# Patient Record
Sex: Male | Born: 1960 | Race: White | Hispanic: No | Marital: Married | Smoking: Never smoker
Health system: Southern US, Community
[De-identification: ages and names within clinical notes are randomized; demographics above are authoritative.]

## PROBLEM LIST (undated history)

## (undated) DIAGNOSIS — Z953 Presence of xenogenic heart valve: Secondary | ICD-10-CM

## (undated) DIAGNOSIS — I10 Essential (primary) hypertension: Secondary | ICD-10-CM

## (undated) DIAGNOSIS — E785 Hyperlipidemia, unspecified: Secondary | ICD-10-CM

## (undated) DIAGNOSIS — Z9889 Other specified postprocedural states: Secondary | ICD-10-CM

## (undated) DIAGNOSIS — I351 Nonrheumatic aortic (valve) insufficiency: Secondary | ICD-10-CM

## (undated) DIAGNOSIS — R011 Cardiac murmur, unspecified: Secondary | ICD-10-CM

## (undated) DIAGNOSIS — Z803 Family history of malignant neoplasm of breast: Secondary | ICD-10-CM

## (undated) DIAGNOSIS — G510 Bell's palsy: Secondary | ICD-10-CM

## (undated) DIAGNOSIS — I340 Nonrheumatic mitral (valve) insufficiency: Secondary | ICD-10-CM

## (undated) DIAGNOSIS — R972 Elevated prostate specific antigen [PSA]: Secondary | ICD-10-CM

## (undated) DIAGNOSIS — I341 Nonrheumatic mitral (valve) prolapse: Secondary | ICD-10-CM

## (undated) HISTORY — DX: Hyperlipidemia, unspecified: E78.5

## (undated) HISTORY — DX: Elevated prostate specific antigen (PSA): R97.20

## (undated) HISTORY — PX: INGUINAL HERNIA REPAIR: SUR1180

## (undated) HISTORY — PX: HIP FRACTURE SURGERY: SHX118

## (undated) HISTORY — DX: Family history of malignant neoplasm of breast: Z80.3

## (undated) HISTORY — DX: Cardiac murmur, unspecified: R01.1

## (undated) HISTORY — DX: Bell's palsy: G51.0

## (undated) HISTORY — DX: Essential (primary) hypertension: I10

## (undated) HISTORY — PX: MITRAL VALVE REPLACEMENT: SHX147

## (undated) HISTORY — DX: Presence of xenogenic heart valve: Z95.3

## (undated) HISTORY — DX: Nonrheumatic mitral (valve) prolapse: I34.1

## (undated) HISTORY — DX: Other specified postprocedural states: Z98.890

## (undated) HISTORY — DX: Nonrheumatic aortic (valve) insufficiency: I35.1

## (undated) HISTORY — PX: AORTIC VALVE REPLACEMENT: SHX41

## (undated) HISTORY — DX: Nonrheumatic mitral (valve) insufficiency: I34.0

---

## 2005-07-13 ENCOUNTER — Ambulatory Visit (HOSPITAL_COMMUNITY): Admission: RE | Admit: 2005-07-13 | Discharge: 2005-07-13 | Payer: Self-pay | Admitting: *Deleted

## 2010-07-22 ENCOUNTER — Other Ambulatory Visit: Payer: Self-pay

## 2010-07-27 ENCOUNTER — Other Ambulatory Visit: Payer: Self-pay | Admitting: Internal Medicine

## 2010-07-27 ENCOUNTER — Encounter (INDEPENDENT_AMBULATORY_CARE_PROVIDER_SITE_OTHER): Payer: Self-pay | Admitting: *Deleted

## 2010-07-27 ENCOUNTER — Other Ambulatory Visit: Payer: PRIVATE HEALTH INSURANCE

## 2010-07-27 DIAGNOSIS — Z0389 Encounter for observation for other suspected diseases and conditions ruled out: Secondary | ICD-10-CM

## 2010-07-27 DIAGNOSIS — Z0000 Encounter for general adult medical examination without abnormal findings: Secondary | ICD-10-CM

## 2010-07-27 LAB — BASIC METABOLIC PANEL
BUN: 21 mg/dL (ref 6–23)
CO2: 27 mEq/L (ref 19–32)
Calcium: 9.3 mg/dL (ref 8.4–10.5)
Chloride: 105 mEq/L (ref 96–112)
Creatinine, Ser: 1.1 mg/dL (ref 0.4–1.5)
GFR: 76.85 mL/min (ref >60.00)
Glucose, Bld: 90 mg/dL (ref 70–99)
Potassium: 4.4 mEq/L (ref 3.5–5.1)
Sodium: 139 mEq/L (ref 135–145)

## 2010-07-27 LAB — CBC WITH DIFFERENTIAL/PLATELET
Basophils Absolute: 0 10*3/uL (ref 0.0–0.1)
Basophils Relative: 0.5 % (ref 0.0–3.0)
Eosinophils Absolute: 0.2 10*3/uL (ref 0.0–0.7)
Eosinophils Relative: 2.6 % (ref 0.0–5.0)
HCT: 45 % (ref 39.0–52.0)
Hemoglobin: 15.2 g/dL (ref 13.0–17.0)
Lymphocytes Relative: 23.6 % (ref 12.0–46.0)
Lymphs Abs: 1.7 10*3/uL (ref 0.7–4.0)
MCHC: 33.9 g/dL (ref 30.0–36.0)
MCV: 91.3 fl (ref 78.0–100.0)
Monocytes Absolute: 0.7 10*3/uL (ref 0.1–1.0)
Monocytes Relative: 9.9 % (ref 3.0–12.0)
Neutro Abs: 4.6 10*3/uL (ref 1.4–7.7)
Neutrophils Relative %: 63.4 % (ref 43.0–77.0)
Platelets: 197 10*3/uL (ref 150.0–400.0)
RBC: 4.93 Mil/uL (ref 4.22–5.81)
RDW: 12.9 % (ref 11.5–14.6)
WBC: 7.3 10*3/uL (ref 4.5–10.5)

## 2010-07-27 LAB — TSH: TSH: 1.49 u[IU]/mL (ref 0.35–5.50)

## 2010-07-27 LAB — URINALYSIS
Bilirubin Urine: NEGATIVE
Hgb urine dipstick: NEGATIVE
Ketones, ur: NEGATIVE
Leukocytes, UA: NEGATIVE
Nitrite: NEGATIVE
Specific Gravity, Urine: 1.025 (ref 1.000–1.030)
Total Protein, Urine: NEGATIVE
Urine Glucose: NEGATIVE
Urobilinogen, UA: 0.2 (ref 0.0–1.0)
pH: 6 (ref 5.0–8.0)

## 2010-07-27 LAB — HEPATIC FUNCTION PANEL
ALT: 43 U/L (ref 0–53)
AST: 23 U/L (ref 0–37)
Albumin: 4.3 g/dL (ref 3.5–5.2)
Alkaline Phosphatase: 55 U/L (ref 39–117)
Bilirubin, Direct: 0.2 mg/dL (ref 0.0–0.3)
Total Bilirubin: 1.1 mg/dL (ref 0.3–1.2)
Total Protein: 6.7 g/dL (ref 6.0–8.3)

## 2010-07-27 LAB — LIPID PANEL
Cholesterol: 180 mg/dL (ref 0–200)
HDL: 41.6 mg/dL (ref >39.00)
LDL Cholesterol: 119 mg/dL — ABNORMAL HIGH (ref 0–99)
Total CHOL/HDL Ratio: 4
Triglycerides: 95 mg/dL (ref 0.0–149.0)
VLDL: 19 mg/dL (ref 0.0–40.0)

## 2010-07-27 LAB — PSA: PSA: 0.84 ng/mL (ref 0.10–4.00)

## 2010-07-29 ENCOUNTER — Encounter: Payer: Self-pay | Admitting: Internal Medicine

## 2010-07-29 ENCOUNTER — Ambulatory Visit (INDEPENDENT_AMBULATORY_CARE_PROVIDER_SITE_OTHER): Payer: PRIVATE HEALTH INSURANCE | Admitting: Internal Medicine

## 2010-07-29 DIAGNOSIS — J45909 Unspecified asthma, uncomplicated: Secondary | ICD-10-CM | POA: Insufficient documentation

## 2010-07-29 DIAGNOSIS — J309 Allergic rhinitis, unspecified: Secondary | ICD-10-CM | POA: Insufficient documentation

## 2010-07-29 DIAGNOSIS — Z0000 Encounter for general adult medical examination without abnormal findings: Secondary | ICD-10-CM

## 2010-07-29 DIAGNOSIS — I059 Rheumatic mitral valve disease, unspecified: Secondary | ICD-10-CM | POA: Insufficient documentation

## 2010-08-09 NOTE — Assessment & Plan Note (Signed)
Summary: NEW cpx/[employer]/ # /CD   Vital Signs:  Patient profile:   50 year old male Height:      72 inches Weight:      171 pounds BMI:     23.28 O2 Sat:      97 % on Room air Temp:     97.6 degrees F oral Pulse rate:   60 / minute BP sitting:   112 / 80  (left arm) Cuff size:   regular  Vitals Entered By: Bill Salinas CMA (July 29, 2010 10:12 AM)  O2 Flow:  Room air CC: New pt here to est care with primary MD  Vision Screening:      Vision Comments: Pt has appt for eye exam in 2 weeks   Primary Care Provider:  Illene Regulus  CC:  New pt here to est care with primary MD.  History of Present Illness: Cody Barnes presents to establish for on-going continuity care.  CC: he reports he has had 3 episodes of dizziness that have all been short in duration. Not room spinning but disorienting and more akin to dysequilibrium. Happened once when driving, twice while sitting at work. He describes being off balance, no nausea, had a slight headache, no chills, no pain in the ear, no double vision. No sequelae after the sensation passed.   He is otherwise feeling great.   Preventive Screening-Counseling & Management  Alcohol-Tobacco     Alcohol drinks/day: <1     Alcohol type: wine, beer     Smoking Status: never  Caffeine-Diet-Exercise     Caffeine use/day: 4 cups daily     Diet Comments: normal healthy diet     Does Patient Exercise: no  Hep-HIV-STD-Contraception     Hepatitis Risk: no risk noted     HIV Risk: no risk noted     STD Risk: no risk noted     Dental Visit-last 6 months yes     Sun Exposure-Excessive: no  Safety-Violence-Falls     Seat Belt Use: yes     Helmet Use: yes     Firearms in the Home: no firearms in the home     Smoke Detectors: yes     Violence in the Home: no risk noted     Sexual Abuse: no      Sexual History:  currently monogamous.        Drug Use:  never.        Blood Transfusions:  no.    Current Medications (verified): 1)   None  Allergies (verified): No Known Drug Allergies  Past History:  Past Medical History: UCD, fully immunized MITRAL VALVE PROLAPSE (ICD-424.0) ALLERGIC RHINITIS (ICD-477.9) ASTHMA, CHILDHOOD (ICD-493.00)  Past Surgical History: Left Hip fracture ORIF '97- fall related.  Family History: Mother - deceased @62 : breast cancer @ 12; recurrence w/ liver, DM, HTN Father- 56: good health; lipids; CAD with mild MI Neg - Prostate or colon cancer Maternal 2 degree kin - frequent cance PGM - CAD/MI @ 30 Paternal 2nd degree kin with CAD/MI  Social History: [facility] [place] - accting Married - '86 1 dtr - '93, 1 son '91 work - Optician, dispensing - accountant SO - multiple medical problems. Smoking Status:  never Caffeine use/day:  4 cups daily Does Patient Exercise:  no Dental Care w/in 6 mos.:  yes Sun Exposure-Excessive:  no Seat Belt Use:  yes Hepatitis Risk:  no risk noted HIV Risk:  no risk noted STD Risk:  no risk  noted Sexual History:  currently monogamous Drug Use:  never Blood Transfusions:  no  Review of Systems  The patient denies anorexia, fever, weight loss, weight gain, vision loss, decreased hearing, hoarseness, chest pain, dyspnea on exertion, prolonged cough, headaches, abdominal pain, severe indigestion/heartburn, hematuria, incontinence, muscle weakness, suspicious skin lesions, difficulty walking, depression, abnormal bleeding, enlarged lymph nodes, angioedema, and testicular masses.         Having reconstructive dental work - front bridge  Physical Exam  General:  Well-developed,well-nourished,in no acute distress; alert,appropriate and cooperative throughout examination Head:  Normocephalic and atraumatic without obvious abnormalities. No apparent alopecia or balding. Eyes:  No corneal or conjunctival inflammation noted. EOMI. Perrla. Funduscopic exam benign, without hemorrhages, exudates or papilledema. Vision grossly normal. Ears:  External ear exam  shows no significant lesions or deformities.  Otoscopic examination reveals clear canals, tympanic membranes are intact bilaterally without bulging, retraction, inflammation or discharge. Hearing is grossly normal bilaterally. Nose:  no external deformity, no external erythema, and no nasal discharge.   Mouth:  Oral mucosa and oropharynx without lesions or exudates.  Teeth in good repair. Neck:  supple, full ROM, no thyromegaly, and no carotid bruits.   Chest Wall:  no deformities.   Lungs:  Normal respiratory effort, chest expands symmetrically. Lungs are clear to auscultation, no crackles or wheezes. Heart:  normal rate and regular rhythm. III/VI systolic murmur heard best at the apex and axillary line. I/VI diastolic murmur. II/VI murmur at RSB.  Abdomen:  soft, non-tender, normal bowel sounds, no guarding, and no hepatomegaly.   Prostate:  deferred to normal PSA Msk:  normal ROM, no joint tenderness, no joint swelling, no joint warmth, and no joint deformities.   Pulses:  2+ Radial, DP and PT pulses Extremities:  No clubbing, cyanosis, edema, or deformity noted with normal full range of motion of all joints.   Neurologic:  alert & oriented X3, cranial nerves II-XII intact, strength normal in all extremities, gait normal, and DTRs symmetrical and normal.   Skin:  turgor normal, color normal, and no suspicious lesions.   Cervical Nodes:  no anterior cervical adenopathy and no posterior cervical adenopathy.   Axillary Nodes:  no R axillary adenopathy and no L axillary adenopathy.   Inguinal Nodes:  no R inguinal adenopathy and no L inguinal adenopathy.   Psych:  Oriented X3, memory intact for recent and remote, normally interactive, and good eye contact.     Impression & Recommendations:  Problem # 1:  MITRAL VALVE PROLAPSE (ICD-424.0) Patient with mitral valve prolapse on exam and question of mild regurg. He reports he has been having q6 months 2D echos. Copies of reports requested. He is  advised that unless he has 2+ or greater regurg annual or every other year echos should suffice.  Problem # 2:  Preventive Health Care (ICD-V70.0) Unremarkable medical history and no active complaints. Physical exam is normal except for mitral valve murmur. Lab results are normal incluidng an LDL of 119 which is less than goal  (NCEP ATPIII) of 130 or less. PSA is normal at 0.84. He is a candidate for routine colorectal cancer screening and is referred to GI for colonoscopy.  No EKG today due to close follow-up by [facility] cardiology. Immunizations: current with inlfuenza; he will check records re: tetnus.   In summary - a nice gentleman who appears to be medically stable. He is advised to adopt a regular aerobic exericse program with a minimum of of aerobic exercise 3 times a  week. He is advised to have repeat general labs no sooner than 3 years unless there is a dramaitc change in life-style. He should return for a general exam in 2+ years otherwise he will return as needed.   Other Orders: Gastroenterology Referral (GI)  Patient: Cody Barnes Note: All result statuses are Final unless otherwise noted.  Tests: (1) CBC Platelet w/Diff (CBCD)   White Cell Count          7.3 K/uL                    4.5-10.5   Red Cell Count            4.93 Mil/uL                 4.22-5.81   Hemoglobin                15.2 g/dL                   16.1-09.6   Hematocrit                45.0 %                      39.0-52.0   MCV                       91.3 fl                     78.0-100.0   MCHC                      33.9 g/dL                   04.5-40.9   RDW                       12.9 %                      11.5-14.6   Platelet Count            197.0 K/uL                  150.0-400.0   Neutrophil %              63.4 %                      43.0-77.0   Lymphocyte %              23.6 %                      12.0-46.0   Monocyte %                9.9 %                       3.0-12.0   Eosinophils%              2.6  %                       0.0-5.0   Basophils %               0.5 %  0.0-3.0   Neutrophill Absolute      4.6 K/uL                    1.4-7.7   Lymphocyte Absolute       1.7 K/uL                    0.7-4.0   Monocyte Absolute         0.7 K/uL                    0.1-1.0  Eosinophils, Absolute                             0.2 K/uL                    0.0-0.7   Basophils Absolute        0.0 K/uL                    0.0-0.1  Tests: (2) Lipid Panel (LIPID)   Cholesterol               180 mg/dL                   0-865     ATP III Classification            Desirable:  < 200 mg/dL                    Borderline High:  200 - 239 mg/dL               High:  > = 240 mg/dL   Triglycerides             95.0 mg/dL                  7.8-469.6     Normal:  <150 mg/dL     Borderline High:  295 - 199 mg/dL   HDL                       28.41 mg/dL                 >32.44   VLDL Cholesterol          19.0 mg/dL                  0.1-02.7   LDL Cholesterol      [H]  253 mg/dL                   6-64  CHO/HDL Ratio:  CHD Risk                             4                    Men          Women     1/2 Average Risk     3.4          3.3     Average Risk          5.0          4.4     2X Average Risk          9.6          7.1  3X Average Risk          15.0          11.0                           Tests: (3) BMP (METABOL)   Sodium                    139 mEq/L                   135-145   Potassium                 4.4 mEq/L                   3.5-5.1   Chloride                  105 mEq/L                   96-112   Carbon Dioxide            27 mEq/L                    19-32   Glucose                   90 mg/dL                    16-10   BUN                       21 mg/dL                    9-60   Creatinine                1.1 mg/dL                   4.5-4.0   Calcium                   9.3 mg/dL                   9.8-11.9   GFR                       76.85 mL/min                >60.00  Tests: (4)  Hepatic/Liver Function Panel (HEPATIC)   Total Bilirubin           1.1 mg/dL                   1.4-7.8   Direct Bilirubin          0.2 mg/dL                   2.9-5.6   Alkaline Phosphatase      55 U/L                      39-117   AST                       23 U/L                      0-37   ALT  43 U/L                      0-53   Total Protein             6.7 g/dL                    0.4-5.4   Albumin                   4.3 g/dL                    0.9-8.1  Tests: (5) TSH (TSH)   FastTSH                   1.49 uIU/mL                 0.35-5.50  Tests: (6) UDip Only (UDIP)   Color                     Yellow       RANGE:  Yellow;Lt. Yellow   Clarity                   CLEAR                       Clear   Specific Gravity          1.025                       1.000 - 1.030   Urine Ph                  6.0                         5.0-8.0   Protein                   NEGATIVE                    Negative   Urine Glucose             NEGATIVE                    Negative   Ketones                   NEGATIVE                    Negative   Urine Bilirubin           NEGATIVE                    Negative   Blood                     NEGATIVE                    Negative   Urobilinogen              0.2                         0.0 - 1.0   Leukocyte Esterace        NEGATIVE                    Negative   Nitrite  NEGATIVE                    Negative  Tests: (7) Prostate Specific Antigen (PSA)   PSA-Hyb                   0.84 ng/mL                  0.10-4.00  Orders Added: 1)  Gastroenterology Referral [GI] 2)  New Patient 40-64 years [[id number]]   Immunization History:  Influenza Immunization History:    Influenza:  historical (03/02/2010)   Immunization History:  Influenza Immunization History:    Influenza:  Historical (03/02/2010)

## 2010-10-20 ENCOUNTER — Ambulatory Visit (AMBULATORY_SURGERY_CENTER): Payer: PRIVATE HEALTH INSURANCE | Admitting: *Deleted

## 2010-10-20 VITALS — Ht 72.0 in | Wt 173.0 lb

## 2010-10-20 DIAGNOSIS — Z1211 Encounter for screening for malignant neoplasm of colon: Secondary | ICD-10-CM

## 2010-10-20 MED ORDER — PEG-KCL-NACL-NASULF-NA ASC-C 100 G PO SOLR: ORAL | Status: DC

## 2010-10-25 ENCOUNTER — Encounter: Payer: Self-pay | Admitting: Gastroenterology

## 2010-11-04 ENCOUNTER — Encounter: Payer: Self-pay | Admitting: Gastroenterology

## 2010-11-04 ENCOUNTER — Ambulatory Visit (AMBULATORY_SURGERY_CENTER): Payer: PRIVATE HEALTH INSURANCE | Admitting: Gastroenterology

## 2010-11-04 VITALS — BP 133/73 | HR 70 | Temp 96.8°F | Resp 18 | Ht 72.0 in | Wt 173.0 lb

## 2010-11-04 DIAGNOSIS — Z1211 Encounter for screening for malignant neoplasm of colon: Secondary | ICD-10-CM

## 2010-11-04 LAB — HM COLONOSCOPY: HM Colonoscopy: NORMAL

## 2010-11-04 MED ORDER — SODIUM CHLORIDE 0.9 % IV SOLN: 500.0000 mL | INTRAVENOUS | Status: DC

## 2010-11-04 NOTE — Patient Instructions (Signed)
FINDINGS: NORMAL COLON  REPEAT EXAM IN 10 YEARS  YOU MAY RESUME YOUR MEDICATIONS AS YOU WOULD NORMALLY TAKE THEM

## 2010-11-07 ENCOUNTER — Telehealth: Payer: Self-pay | Admitting: *Deleted

## 2010-11-07 NOTE — Telephone Encounter (Signed)
Left mess. On cell phone.i.d. Was on voicemail.

## 2011-09-21 ENCOUNTER — Other Ambulatory Visit (INDEPENDENT_AMBULATORY_CARE_PROVIDER_SITE_OTHER): Payer: BC Managed Care – PPO

## 2011-09-21 ENCOUNTER — Ambulatory Visit (INDEPENDENT_AMBULATORY_CARE_PROVIDER_SITE_OTHER): Payer: BC Managed Care – PPO | Admitting: Internal Medicine

## 2011-09-21 ENCOUNTER — Encounter: Payer: Self-pay | Admitting: Internal Medicine

## 2011-09-21 VITALS — BP 116/72 | HR 56 | Temp 97.4°F | Resp 16 | Wt 169.5 lb

## 2011-09-21 DIAGNOSIS — Z23 Encounter for immunization: Secondary | ICD-10-CM

## 2011-09-21 DIAGNOSIS — Z0000 Encounter for general adult medical examination without abnormal findings: Secondary | ICD-10-CM

## 2011-09-21 DIAGNOSIS — I059 Rheumatic mitral valve disease, unspecified: Secondary | ICD-10-CM

## 2011-09-21 LAB — COMPREHENSIVE METABOLIC PANEL
ALT: 44 U/L (ref 0–53)
AST: 19 U/L (ref 0–37)
Albumin: 4.5 g/dL (ref 3.5–5.2)
Alkaline Phosphatase: 68 U/L (ref 39–117)
BUN: 16 mg/dL (ref 6–23)
CO2: 27 mEq/L (ref 19–32)
Calcium: 9.5 mg/dL (ref 8.4–10.5)
Chloride: 104 mEq/L (ref 96–112)
Creatinine, Ser: 1 mg/dL (ref 0.4–1.5)
GFR: 84.58 mL/min (ref >60.00)
Glucose, Bld: 87 mg/dL (ref 70–99)
Potassium: 4.8 mEq/L (ref 3.5–5.1)
Sodium: 138 mEq/L (ref 135–145)
Total Bilirubin: 0.7 mg/dL (ref 0.3–1.2)
Total Protein: 7 g/dL (ref 6.0–8.3)

## 2011-09-21 LAB — CBC WITH DIFFERENTIAL/PLATELET
Basophils Absolute: 0 10*3/uL (ref 0.0–0.1)
Basophils Relative: 0.4 % (ref 0.0–3.0)
Eosinophils Absolute: 0.1 10*3/uL (ref 0.0–0.7)
Eosinophils Relative: 2 % (ref 0.0–5.0)
HCT: 46.6 % (ref 39.0–52.0)
Hemoglobin: 15.5 g/dL (ref 13.0–17.0)
Lymphocytes Relative: 24.4 % (ref 12.0–46.0)
Lymphs Abs: 1.6 10*3/uL (ref 0.7–4.0)
MCHC: 33.3 g/dL (ref 30.0–36.0)
MCV: 89.6 fl (ref 78.0–100.0)
Monocytes Absolute: 0.7 10*3/uL (ref 0.1–1.0)
Monocytes Relative: 10.2 % (ref 3.0–12.0)
Neutro Abs: 4.1 10*3/uL (ref 1.4–7.7)
Neutrophils Relative %: 63 % (ref 43.0–77.0)
Platelets: 219 10*3/uL (ref 150.0–400.0)
RBC: 5.2 Mil/uL (ref 4.22–5.81)
RDW: 12.9 % (ref 11.5–14.6)
WBC: 6.4 10*3/uL (ref 4.5–10.5)

## 2011-09-21 LAB — LIPID PANEL
Cholesterol: 192 mg/dL (ref 0–200)
HDL: 47.5 mg/dL (ref >39.00)
LDL Cholesterol: 127 mg/dL — ABNORMAL HIGH (ref 0–99)
Total CHOL/HDL Ratio: 4
Triglycerides: 86 mg/dL (ref 0.0–149.0)
VLDL: 17.2 mg/dL (ref 0.0–40.0)

## 2011-09-21 LAB — URINALYSIS, ROUTINE W REFLEX MICROSCOPIC
Bilirubin Urine: NEGATIVE
Hgb urine dipstick: NEGATIVE
Ketones, ur: NEGATIVE
Leukocytes, UA: NEGATIVE
Nitrite: NEGATIVE
Specific Gravity, Urine: 1.015 (ref 1.000–1.030)
Total Protein, Urine: NEGATIVE
Urine Glucose: NEGATIVE
Urobilinogen, UA: 0.2 (ref 0.0–1.0)
pH: 7 (ref 5.0–8.0)

## 2011-09-21 LAB — TSH: TSH: 1.09 u[IU]/mL (ref 0.35–5.50)

## 2011-09-21 LAB — FECAL OCCULT BLOOD, GUAIAC: Fecal Occult Blood: NEGATIVE

## 2011-09-21 LAB — PSA: PSA: 0.94 ng/mL (ref 0.10–4.00)

## 2011-09-21 NOTE — Progress Notes (Signed)
  Subjective:    Patient ID: Cody Barnes, male    DOB: March 25, 1961, 51 y.o.   MRN: [id number]  HPI New to me for a complete physical, he feels well and offers no complaints.   Review of Systems  Constitutional: Negative for fever, chills, diaphoresis, activity change, appetite change, fatigue and unexpected weight change.  HENT: Negative.   Eyes: Negative.   Respiratory: Negative for apnea, cough, choking, chest tightness, shortness of breath, wheezing and stridor.   Cardiovascular: Negative for chest pain, palpitations and leg swelling.  Gastrointestinal: Negative.   Genitourinary: Negative.   Musculoskeletal: Negative.   Skin: Negative.   Neurological: Negative.   Hematological: Negative.   Psychiatric/Behavioral: Negative.        Objective:   Physical Exam  Vitals reviewed. Constitutional: He is oriented to person, place, and time. He appears well-developed and well-nourished. No distress.  HENT:  Head: Normocephalic and atraumatic.  Mouth/Throat: Oropharynx is clear and moist. No oropharyngeal exudate.  Eyes: Conjunctivae are normal. Right eye exhibits no discharge. Left eye exhibits no discharge. No scleral icterus.  Neck: Normal range of motion. Neck supple. No JVD present. No tracheal deviation present. No thyromegaly present.  Cardiovascular: Normal rate, regular rhythm, normal heart sounds and intact distal pulses.  Exam reveals no gallop and no friction rub.   No murmur heard. Pulmonary/Chest: Effort normal and breath sounds normal. No stridor. No respiratory distress. He has no wheezes. He has no rales. He exhibits no tenderness.  Abdominal: Soft. Bowel sounds are normal. He exhibits no distension. There is no tenderness. There is no rebound and no guarding. Hernia confirmed negative in the right inguinal area and confirmed negative in the left inguinal area.  Genitourinary: Rectum normal, prostate normal, testes normal and penis normal. Rectal exam shows no external  hemorrhoid, no internal hemorrhoid, no fissure, no mass, no tenderness and anal tone normal. Guaiac negative stool. Prostate is not enlarged and not tender. Right testis shows no mass, no swelling and no tenderness. Right testis is descended. Left testis shows no mass, no swelling and no tenderness. Left testis is descended. Circumcised. No penile tenderness. No discharge found.  Musculoskeletal: Normal range of motion. He exhibits no edema and no tenderness.  Lymphadenopathy:    He has no cervical adenopathy.       Right: No inguinal adenopathy present.       Left: No inguinal adenopathy present.  Neurological: He is oriented to person, place, and time.  Skin: Skin is warm and dry. No rash noted. He is not diaphoretic. No erythema. No pallor.  Psychiatric: He has a normal mood and affect. His behavior is normal. Judgment and thought content normal.      Lab Results  Component Value Date   WBC 7.3 07/27/2010   HGB 15.2 07/27/2010   HCT 45.0 07/27/2010   PLT 197.0 07/27/2010   GLUCOSE 90 07/27/2010   CHOL 180 07/27/2010   TRIG 95.0 07/27/2010   HDL 41.60 07/27/2010   LDLCALC 119* 07/27/2010   ALT 43 07/27/2010   AST 23 07/27/2010   NA 139 07/27/2010   K 4.4 07/27/2010   CL 105 07/27/2010   CREATININE 1.1 07/27/2010   BUN 21 07/27/2010   CO2 27 07/27/2010   TSH 1.49 07/27/2010   PSA 0.84 07/27/2010      Assessment & Plan:

## 2011-09-21 NOTE — Assessment & Plan Note (Signed)
Vaccines were updated, exam done, labs ordered, pt ed material was given

## 2011-09-21 NOTE — Patient Instructions (Signed)
Health Maintenance, Males A healthy lifestyle and preventative care can promote health and wellness.  Maintain regular health, dental, and eye exams.   Eat a healthy diet. Foods like vegetables, fruits, whole grains, low-fat dairy products, and lean protein foods contain the nutrients you need without too many calories. Decrease your intake of foods high in solid fats, added sugars, and salt. Get information about a proper diet from your caregiver, if necessary.   Regular physical exercise is one of the most important things you can do for your health. Most adults should get at least 150 minutes of moderate-intensity exercise (any activity that increases your heart rate and causes you to sweat) each week. In addition, most adults need muscle-strengthening exercises on 2 or more days a week.    Maintain a healthy weight. The body mass index (BMI) is a screening tool to identify possible weight problems. It provides an estimate of body fat based on height and weight. Your caregiver can help determine your BMI, and can help you achieve or maintain a healthy weight. For adults 20 years and older:   A BMI below 18.5 is considered underweight.   A BMI of 18.5 to 24.9 is normal.   A BMI of 25 to 29.9 is considered overweight.   A BMI of 30 and above is considered obese.   Maintain normal blood lipids and cholesterol by exercising and minimizing your intake of saturated fat. Eat a balanced diet with plenty of fruits and vegetables. Blood tests for lipids and cholesterol should begin at age 20 and be repeated every 5 years. If your lipid or cholesterol levels are high, you are over 50, or you are a high risk for heart disease, you may need your cholesterol levels checked more frequently.Ongoing high lipid and cholesterol levels should be treated with medicines, if diet and exercise are not effective.   If you smoke, find out from your caregiver how to quit. If you do not use tobacco, do not start.    If you choose to drink alcohol, do not exceed 2 drinks per day. One drink is considered to be 12 ounces (355 mL) of beer, 5 ounces (148 mL) of wine, or 1.5 ounces (44 mL) of liquor.   Avoid use of street drugs. Do not share needles with anyone. Ask for help if you need support or instructions about stopping the use of drugs.   High blood pressure causes heart disease and increases the risk of stroke. Blood pressure should be checked at least every 1 to 2 years. Ongoing high blood pressure should be treated with medicines if weight loss and exercise are not effective.   If you are 45 to 52 years old, ask your caregiver if you should take aspirin to prevent heart disease.   Diabetes screening involves taking a blood sample to check your fasting blood sugar level. This should be done once every 3 years, after age 45, if you are within normal weight and without risk factors for diabetes. Testing should be considered at a younger age or be carried out more frequently if you are overweight and have at least 1 risk factor for diabetes.   Colorectal cancer can be detected and often prevented. Most routine colorectal cancer screening begins at the age of 50 and continues through age 75. However, your caregiver may recommend screening at an earlier age if you have risk factors for colon cancer. On a yearly basis, your caregiver may provide home test kits to check for hidden   blood in the stool. Use of a small camera at the end of a tube, to directly examine the colon (sigmoidoscopy or colonoscopy), can detect the earliest forms of colorectal cancer. Talk to your caregiver about this at age 50, when routine screening begins. Direct examination of the colon should be repeated every 5 to 10 years through age 75, unless early forms of pre-cancerous polyps or small growths are found.   Hepatitis C blood testing is recommended for all people born from 1945 through 1965 and any individual with known risks for  hepatitis C.   Healthy men should no longer receive prostate-specific antigen (PSA) blood tests as part of routine cancer screening. Consult with your caregiver about prostate cancer screening.   Testicular cancer screening is not recommended for adolescents or adult males who have no symptoms. Screening includes self-exam, caregiver exam, and other screening tests. Consult with your caregiver about any symptoms you have or any concerns you have about testicular cancer.   Practice safe sex. Use condoms and avoid high-risk sexual practices to reduce the spread of sexually transmitted infections (STIs).   Use sunscreen with a sun protection factor (SPF) of 30 or greater. Apply sunscreen liberally and repeatedly throughout the day. You should seek shade when your shadow is shorter than you. Protect yourself by wearing long sleeves, pants, a wide-brimmed hat, and sunglasses year round, whenever you are outdoors.   Notify your caregiver of new moles or changes in moles, especially if there is a change in shape or color. Also notify your caregiver if a mole is larger than the size of a pencil eraser.   A one-time screening for abdominal aortic aneurysm (AAA) and surgical repair of large AAAs by sound wave imaging (ultrasonography) is recommended for ages 65 to 75 years who are current or former smokers.   Stay current with your immunizations.  Document Released: 11/11/2007 Document Revised: 05/04/2011 Document Reviewed: 10/10/2010 [employer] Patient Information 2012 [employer]. 

## 2012-09-25 ENCOUNTER — Other Ambulatory Visit (INDEPENDENT_AMBULATORY_CARE_PROVIDER_SITE_OTHER): Payer: BC Managed Care – PPO

## 2012-09-25 ENCOUNTER — Encounter: Payer: Self-pay | Admitting: Internal Medicine

## 2012-09-25 ENCOUNTER — Ambulatory Visit (INDEPENDENT_AMBULATORY_CARE_PROVIDER_SITE_OTHER): Payer: BC Managed Care – PPO | Admitting: Internal Medicine

## 2012-09-25 VITALS — BP 116/70 | HR 60 | Temp 97.8°F | Resp 16 | Ht 72.0 in | Wt 174.0 lb

## 2012-09-25 DIAGNOSIS — Z0000 Encounter for general adult medical examination without abnormal findings: Secondary | ICD-10-CM

## 2012-09-25 LAB — LIPID PANEL
Cholesterol: 181 mg/dL (ref 0–200)
HDL: 44.8 mg/dL (ref >39.00)
LDL Cholesterol: 116 mg/dL — ABNORMAL HIGH (ref 0–99)
Total CHOL/HDL Ratio: 4
Triglycerides: 102 mg/dL (ref 0.0–149.0)
VLDL: 20.4 mg/dL (ref 0.0–40.0)

## 2012-09-25 LAB — COMPREHENSIVE METABOLIC PANEL
ALT: 34 U/L (ref 0–53)
AST: 20 U/L (ref 0–37)
Albumin: 4.4 g/dL (ref 3.5–5.2)
Alkaline Phosphatase: 57 U/L (ref 39–117)
BUN: 17 mg/dL (ref 6–23)
CO2: 29 mEq/L (ref 19–32)
Calcium: 9.3 mg/dL (ref 8.4–10.5)
Chloride: 104 mEq/L (ref 96–112)
Creatinine, Ser: 1 mg/dL (ref 0.4–1.5)
GFR: 83.28 mL/min (ref >60.00)
Glucose, Bld: 93 mg/dL (ref 70–99)
Potassium: 4.5 mEq/L (ref 3.5–5.1)
Sodium: 138 mEq/L (ref 135–145)
Total Bilirubin: 1.1 mg/dL (ref 0.3–1.2)
Total Protein: 6.8 g/dL (ref 6.0–8.3)

## 2012-09-25 LAB — URINALYSIS, ROUTINE W REFLEX MICROSCOPIC
Bilirubin Urine: NEGATIVE
Hgb urine dipstick: NEGATIVE
Ketones, ur: NEGATIVE
Leukocytes, UA: NEGATIVE
Nitrite: NEGATIVE
Specific Gravity, Urine: 1.02 (ref 1.000–1.030)
Total Protein, Urine: NEGATIVE
Urine Glucose: NEGATIVE
Urobilinogen, UA: 0.2 (ref 0.0–1.0)
pH: 6 (ref 5.0–8.0)

## 2012-09-25 LAB — CBC WITH DIFFERENTIAL/PLATELET
Basophils Absolute: 0 10*3/uL (ref 0.0–0.1)
Basophils Relative: 0.4 % (ref 0.0–3.0)
Eosinophils Absolute: 0.2 10*3/uL (ref 0.0–0.7)
Eosinophils Relative: 2.9 % (ref 0.0–5.0)
HCT: 46.2 % (ref 39.0–52.0)
Hemoglobin: 15.6 g/dL (ref 13.0–17.0)
Lymphocytes Relative: 21.5 % (ref 12.0–46.0)
Lymphs Abs: 1.5 10*3/uL (ref 0.7–4.0)
MCHC: 33.8 g/dL (ref 30.0–36.0)
MCV: 88.8 fl (ref 78.0–100.0)
Monocytes Absolute: 0.6 10*3/uL (ref 0.1–1.0)
Monocytes Relative: 9.2 % (ref 3.0–12.0)
Neutro Abs: 4.6 10*3/uL (ref 1.4–7.7)
Neutrophils Relative %: 66 % (ref 43.0–77.0)
Platelets: 215 10*3/uL (ref 150.0–400.0)
RBC: 5.2 Mil/uL (ref 4.22–5.81)
RDW: 12.9 % (ref 11.5–14.6)
WBC: 7 10*3/uL (ref 4.5–10.5)

## 2012-09-25 LAB — TSH: TSH: 0.99 u[IU]/mL (ref 0.35–5.50)

## 2012-09-25 LAB — PSA: PSA: 0.94 ng/mL (ref 0.10–4.00)

## 2012-09-25 NOTE — Patient Instructions (Addendum)
Health Maintenance, Males A healthy lifestyle and preventative care can promote health and wellness.  Maintain regular health, dental, and eye exams.  Eat a healthy diet. Foods like vegetables, fruits, whole grains, low-fat dairy products, and lean protein foods contain the nutrients you need without too many calories. Decrease your intake of foods high in solid fats, added sugars, and salt. Get information about a proper diet from your caregiver, if necessary.  Regular physical exercise is one of the most important things you can do for your health. Most adults should get at least 150 minutes of moderate-intensity exercise (any activity that increases your heart rate and causes you to sweat) each week. In addition, most adults need muscle-strengthening exercises on 2 or more days a week.   Maintain a healthy weight. The body mass index (BMI) is a screening tool to identify possible weight problems. It provides an estimate of body fat based on height and weight. Your caregiver can help determine your BMI, and can help you achieve or maintain a healthy weight. For adults 20 years and older:  A BMI below 18.5 is considered underweight.  A BMI of 18.5 to 24.9 is normal.  A BMI of 25 to 29.9 is considered overweight.  A BMI of 30 and above is considered obese.  Maintain normal blood lipids and cholesterol by exercising and minimizing your intake of saturated fat. Eat a balanced diet with plenty of fruits and vegetables. Blood tests for lipids and cholesterol should begin at age 20 and be repeated every 5 years. If your lipid or cholesterol levels are high, you are over 50, or you are a high risk for heart disease, you may need your cholesterol levels checked more frequently.Ongoing high lipid and cholesterol levels should be treated with medicines, if diet and exercise are not effective.  If you smoke, find out from your caregiver how to quit. If you do not use tobacco, do not start.  If you  choose to drink alcohol, do not exceed 2 drinks per day. One drink is considered to be 12 ounces (355 mL) of beer, 5 ounces (148 mL) of wine, or 1.5 ounces (44 mL) of liquor.  Avoid use of street drugs. Do not share needles with anyone. Ask for help if you need support or instructions about stopping the use of drugs.  High blood pressure causes heart disease and increases the risk of stroke. Blood pressure should be checked at least every 1 to 2 years. Ongoing high blood pressure should be treated with medicines if weight loss and exercise are not effective.  If you are 45 to 53 years old, ask your caregiver if you should take aspirin to prevent heart disease.  Diabetes screening involves taking a blood sample to check your fasting blood sugar level. This should be done once every 3 years, after age 45, if you are within normal weight and without risk factors for diabetes. Testing should be considered at a younger age or be carried out more frequently if you are overweight and have at least 1 risk factor for diabetes.  Colorectal cancer can be detected and often prevented. Most routine colorectal cancer screening begins at the age of 50 and continues through age 75. However, your caregiver may recommend screening at an earlier age if you have risk factors for colon cancer. On a yearly basis, your caregiver may provide home test kits to check for hidden blood in the stool. Use of a small camera at the end of a tube,   to directly examine the colon (sigmoidoscopy or colonoscopy), can detect the earliest forms of colorectal cancer. Talk to your caregiver about this at age 50, when routine screening begins. Direct examination of the colon should be repeated every 5 to 10 years through age 75, unless early forms of pre-cancerous polyps or small growths are found.  Hepatitis C blood testing is recommended for all people born from 1945 through 1965 and any individual with known risks for hepatitis C.  Healthy  men should no longer receive prostate-specific antigen (PSA) blood tests as part of routine cancer screening. Consult with your caregiver about prostate cancer screening.  Testicular cancer screening is not recommended for adolescents or adult males who have no symptoms. Screening includes self-exam, caregiver exam, and other screening tests. Consult with your caregiver about any symptoms you have or any concerns you have about testicular cancer.  Practice safe sex. Use condoms and avoid high-risk sexual practices to reduce the spread of sexually transmitted infections (STIs).  Use sunscreen with a sun protection factor (SPF) of 30 or greater. Apply sunscreen liberally and repeatedly throughout the day. You should seek shade when your shadow is shorter than you. Protect yourself by wearing long sleeves, pants, a wide-brimmed hat, and sunglasses year round, whenever you are outdoors.  Notify your caregiver of new moles or changes in moles, especially if there is a change in shape or color. Also notify your caregiver if a mole is larger than the size of a pencil eraser.  A one-time screening for abdominal aortic aneurysm (AAA) and surgical repair of large AAAs by sound wave imaging (ultrasonography) is recommended for ages 65 to 75 years who are current or former smokers.  Stay current with your immunizations. Document Released: 11/11/2007 Document Revised: 08/07/2011 Document Reviewed: 10/10/2010 [employer] Patient Information 2013 [employer].  

## 2012-09-25 NOTE — Progress Notes (Signed)
  Subjective:    Patient ID: Cody Barnes, male    DOB: 1960-08-02, 52 y.o.   MRN: [id number]  HPI  He returns for a physical - he feels well and offers no complaints.  Review of Systems  Constitutional: Negative.   HENT: Negative.   Eyes: Negative.   Respiratory: Negative.   Cardiovascular: Negative.   Gastrointestinal: Negative.   Endocrine: Negative.   Genitourinary: Negative.   Musculoskeletal: Negative.   Skin: Negative.   Allergic/Immunologic: Negative.   Neurological: Negative.   Hematological: Negative.   Psychiatric/Behavioral: Negative.        Objective:   Physical Exam  Vitals reviewed. Constitutional: He is oriented to person, place, and time. He appears well-developed and well-nourished. No distress.  HENT:  Head: Normocephalic and atraumatic.  Mouth/Throat: Oropharynx is clear and moist. No oropharyngeal exudate.  Eyes: Conjunctivae are normal. Right eye exhibits no discharge. Left eye exhibits no discharge. No scleral icterus.  Neck: Normal range of motion. Neck supple. No JVD present. No tracheal deviation present. No thyromegaly present.  Cardiovascular: Normal rate, regular rhythm, normal heart sounds and intact distal pulses.  Exam reveals no gallop and no friction rub.   No murmur heard. Pulmonary/Chest: Effort normal and breath sounds normal. No stridor. No respiratory distress. He has no wheezes. He has no rales. He exhibits no tenderness.  Abdominal: Soft. Bowel sounds are normal. He exhibits no distension and no mass. There is no tenderness. There is no rebound and no guarding. Hernia confirmed negative in the right inguinal area and confirmed negative in the left inguinal area.  Genitourinary: Rectum normal, prostate normal, testes normal and penis normal. Rectal exam shows no external hemorrhoid, no internal hemorrhoid, no fissure, no mass, no tenderness and anal tone normal. Guaiac negative stool. Prostate is not enlarged and not tender. Right testis  shows no mass, no swelling and no tenderness. Right testis is descended. Left testis shows no mass, no swelling and no tenderness. Left testis is descended. Circumcised. No penile erythema or penile tenderness. No discharge found.  Musculoskeletal: Normal range of motion. He exhibits no edema and no tenderness.  Lymphadenopathy:    He has no cervical adenopathy.       Right: No inguinal adenopathy present.       Left: No inguinal adenopathy present.  Neurological: He is oriented to person, place, and time.  Skin: Skin is warm and dry. No rash noted. He is not diaphoretic. No erythema. No pallor.  Psychiatric: He has a normal mood and affect. His behavior is normal. Judgment and thought content normal.     Lab Results  Component Value Date   WBC 6.4 09/21/2011   HGB 15.5 09/21/2011   HCT 46.6 09/21/2011   PLT 219.0 09/21/2011   GLUCOSE 87 09/21/2011   CHOL 192 09/21/2011   TRIG 86.0 09/21/2011   HDL 47.50 09/21/2011   LDLCALC 127* 09/21/2011   ALT 44 09/21/2011   AST 19 09/21/2011   NA 138 09/21/2011   K 4.8 09/21/2011   CL 104 09/21/2011   CREATININE 1.0 09/21/2011   BUN 16 09/21/2011   CO2 27 09/21/2011   TSH 1.09 09/21/2011   PSA 0.94 09/21/2011       Assessment & Plan:

## 2012-09-25 NOTE — Assessment & Plan Note (Signed)
Exam done Labs ordered Vaccines were reviewed Pt ed material was given 

## 2012-12-25 ENCOUNTER — Encounter: Payer: Self-pay | Admitting: Internal Medicine

## 2012-12-25 ENCOUNTER — Ambulatory Visit (INDEPENDENT_AMBULATORY_CARE_PROVIDER_SITE_OTHER): Payer: BC Managed Care – PPO | Admitting: Internal Medicine

## 2012-12-25 VITALS — BP 136/72 | HR 77 | Temp 98.9°F | Resp 16 | Wt 168.6 lb

## 2012-12-25 DIAGNOSIS — J019 Acute sinusitis, unspecified: Secondary | ICD-10-CM

## 2012-12-25 DIAGNOSIS — J309 Allergic rhinitis, unspecified: Secondary | ICD-10-CM

## 2012-12-25 MED ORDER — METHYLPREDNISOLONE ACETATE 40 MG/ML IJ SUSP
120.0000 mg | Freq: Once | INTRAMUSCULAR | Status: AC
  Administered 2012-12-25: 120 mg via INTRAMUSCULAR

## 2012-12-25 MED ORDER — AMOXICILLIN-POT CLAVULANATE 875-125 MG PO TABS: 1.0000 | ORAL_TABLET | Freq: Two times a day (BID) | ORAL | Status: DC

## 2012-12-25 MED ORDER — CETIRIZINE-PSEUDOEPHEDRINE ER 5-120 MG PO TB12: 1.0000 | ORAL_TABLET | Freq: Two times a day (BID) | ORAL | Status: DC

## 2012-12-25 NOTE — Progress Notes (Signed)
Subjective:    Patient ID: Cody Barnes, male    DOB: 12/25/1960, 52 y.o.   MRN: [id number]  Sinusitis This is a recurrent problem. The current episode started 1 to 4 weeks ago. The problem has been gradually worsening since onset. There has been no fever. The fever has been present for less than 1 day. His pain is at a severity of 0/10. He is experiencing no pain. Associated symptoms include congestion, sinus pressure and sneezing. Pertinent negatives include no chills, coughing, diaphoresis, ear pain, headaches, hoarse voice, neck pain, shortness of breath, sore throat or swollen glands. Past treatments include oral decongestants. The treatment provided no relief.      Review of Systems  Constitutional: Negative.  Negative for fever, chills, diaphoresis, appetite change and fatigue.  HENT: Positive for congestion, rhinorrhea, sneezing, postnasal drip and sinus pressure. Negative for hearing loss, ear pain, nosebleeds, sore throat, hoarse voice, drooling, mouth sores, trouble swallowing, neck pain, dental problem and tinnitus.   Eyes: Negative.   Respiratory: Negative.  Negative for apnea, cough, choking, shortness of breath, wheezing and stridor.   Cardiovascular: Negative.  Negative for chest pain, palpitations and leg swelling.  Gastrointestinal: Negative.  Negative for nausea, vomiting, abdominal pain, diarrhea and constipation.  Endocrine: Negative.   Genitourinary: Negative.   Musculoskeletal: Negative.   Skin: Negative.   Allergic/Immunologic: Negative.   Neurological: Negative.  Negative for dizziness, weakness, light-headedness and headaches.  Hematological: Negative.  Negative for adenopathy. Does not bruise/bleed easily.  Psychiatric/Behavioral: Negative.        Objective:   Physical Exam  Vitals reviewed. Constitutional: He is oriented to person, place, and time.  Non-toxic appearance. He does not have a sickly appearance. He does not appear ill. No distress.  HENT:   Head: No trismus in the jaw.  Right Ear: Hearing, tympanic membrane, external ear and ear canal normal.  Left Ear: Hearing, tympanic membrane, external ear and ear canal normal.  Nose: Mucosal edema and rhinorrhea present. No sinus tenderness or nasal deformity. No epistaxis.  No foreign bodies. Right sinus exhibits no maxillary sinus tenderness and no frontal sinus tenderness. Left sinus exhibits no maxillary sinus tenderness and no frontal sinus tenderness.  Mouth/Throat: Oropharynx is clear and moist and mucous membranes are normal. Mucous membranes are not pale, not dry and not cyanotic. No oral lesions. No edematous. No oropharyngeal exudate, posterior oropharyngeal edema, posterior oropharyngeal erythema or tonsillar abscesses.  Eyes: Conjunctivae are normal. Right eye exhibits no discharge. Left eye exhibits no discharge. No scleral icterus.  Neck: Normal range of motion. Neck supple. No JVD present. No tracheal deviation present. No thyromegaly present.  Cardiovascular: Normal rate, regular rhythm, normal heart sounds and intact distal pulses.  Exam reveals no gallop and no friction rub.   No murmur heard. Pulmonary/Chest: Effort normal and breath sounds normal. No stridor. No respiratory distress. He has no wheezes. He has no rales. He exhibits no tenderness.  Abdominal: Soft. Bowel sounds are normal. He exhibits no distension and no mass. There is no tenderness. There is no rebound and no guarding.  Musculoskeletal: Normal range of motion. He exhibits no edema and no tenderness.  Lymphadenopathy:    He has no cervical adenopathy.  Neurological: He is oriented to person, place, and time.  Skin: Skin is warm and dry. No rash noted. No erythema. No pallor.  Psychiatric: He has a normal mood and affect. His behavior is normal. Judgment and thought content normal.  Assessment & Plan:

## 2012-12-25 NOTE — Patient Instructions (Signed)

## 2012-12-26 ENCOUNTER — Encounter: Payer: Self-pay | Admitting: Internal Medicine

## 2012-12-26 NOTE — Assessment & Plan Note (Signed)
He is having a flare of symptoms so I gave him an injection of depo-medrol IM I have asked him to start zyrtec-d as well

## 2012-12-26 NOTE — Assessment & Plan Note (Signed)
I will treat the infection with augmentin 

## 2013-04-08 ENCOUNTER — Encounter: Payer: Self-pay | Admitting: Interventional Cardiology

## 2013-07-15 ENCOUNTER — Ambulatory Visit: Payer: BC Managed Care – PPO | Admitting: Interventional Cardiology

## 2013-07-22 ENCOUNTER — Encounter (INDEPENDENT_AMBULATORY_CARE_PROVIDER_SITE_OTHER): Payer: Self-pay

## 2013-07-22 ENCOUNTER — Encounter: Payer: Self-pay | Admitting: Interventional Cardiology

## 2013-07-22 ENCOUNTER — Ambulatory Visit (INDEPENDENT_AMBULATORY_CARE_PROVIDER_SITE_OTHER): Payer: Managed Care, Other (non HMO) | Admitting: Interventional Cardiology

## 2013-07-22 VITALS — BP 149/79 | HR 73 | Ht 72.0 in | Wt 178.0 lb

## 2013-07-22 DIAGNOSIS — I359 Nonrheumatic aortic valve disorder, unspecified: Secondary | ICD-10-CM

## 2013-07-22 DIAGNOSIS — I059 Rheumatic mitral valve disease, unspecified: Secondary | ICD-10-CM

## 2013-07-22 NOTE — Patient Instructions (Signed)
Your physician has requested that you have an echocardiogram in 6 months. Echocardiography is a painless test that uses sound waves to create images of your heart. It provides your doctor with information about the size and shape of your heart and how well your heart's chambers and valves are working. This procedure takes approximately one hour. There are no restrictions for this procedure.  Your physician recommends that you continue on your current medications as directed. Please refer to the Current Medication list given to you today.  Your physician wants you to follow-up in: 1 year with Dr. Eldridge DaceVaranasi. You will receive a reminder letter in the mail two months in advance. If you don't receive a letter, please call our office to schedule the follow-up appointment.

## 2013-07-22 NOTE — Progress Notes (Signed)
Patient ID: Cody Barnes Roser, male   DOB: [birth year], 53 y.o.   MRN: [id number]    [address] [place], [place]  [address] Phone: [phone] Fax:  [phone]  Date:  07/22/2013   ID:  Cody Barnes Benton, DOB [birth year], MRN [id number]  PCP:  Sanda Lingerhomas Jones, MD      History of Present Illness: Cody Barnes Hurless is a 53 y.o. male who has significant mitral regurgitation. Murmur:  Denies : Chest pain.  Dizziness.  Dyspnea on exertion with high level exercise. No problems with stairs. Fatigue.  Leg edema.  Orthopnea.  Palpitations.  Shortness of breath.  Syncope.     Wt Readings from Last 3 Encounters:  07/22/13 178 lb (80.74 kg)  12/25/12 168 lb 9.6 oz (76.476 kg)  09/25/12 174 lb (78.926 kg)     Past Medical History  Diagnosis Date  . Heart murmur   . Mitral valve prolapse     Current Outpatient Prescriptions  Medication Sig Dispense Refill  . amoxicillin-clavulanate (AUGMENTIN) 875-125 MG per tablet Take 1 tablet by mouth 2 (two) times daily.  28 tablet  0  . cetirizine-pseudoephedrine (ZYRTEC-D) 5-120 MG per tablet Take 1 tablet by mouth as needed.       No current facility-administered medications for this visit.    Allergies:   No Known Allergies  Social History:  The patient  reports that he has never smoked. He has never used smokeless tobacco. He reports that he drinks about 3.0 ounces of alcohol per week. He reports that he does not use illicit drugs.   Family History:  The patient's family history is negative for Cancer, Drug abuse, Early death, Heart disease, Hyperlipidemia, Hypertension, Kidney disease, Stroke, and Arthritis.   ROS:  Please see the history of present illness.  No nausea, vomiting.  No fevers, chills.  No focal weakness.  No dysuria.    All other systems reviewed and negative.   PHYSICAL EXAM: VS:  BP 149/79  Pulse 73  Ht 6' (1.829 m)  Wt 178 lb (80.74 kg)  BMI 24.14 kg/m2 Well nourished, well developed, in no acute  distress HEENT: normal Neck: no JVD, no carotid bruits Cardiac:  normal S1, S2; RRR; 2/6 systolic murmur, 2/6 holodiastolic murmur Lungs:  clear to auscultation bilaterally, no wheezing, rhonchi or rales Abd: soft, nontender, no hepatomegaly Ext: no edema Skin: warm and dry Neuro:   no focal abnormalities noted  EKG:  NSR, PVCs, no significant ST segment changes     ASSESSMENT AND PLAN:  Mitral Regurgitation  Diagnostic Imaging:EKG Harward,Amy 07/09/2012 09:02:15 AM > Eustacio Ellen,JAY 07/09/2012 09:28:36 AM > NSR, no ST segment changes, EC Echocardiogram (Ordered for 11/05/2012)  Plan echo. No sx of CHF. Watch for any Shortness of breath or decreased exercise tolerance.  COntinue SBE prophylaxis.  Will check echocardiogram in 6 months.  Mild dyspnea on exertion with high level exercise. No problems with usual activities.  Continue to try to increase exercise duration. Currently most limited by knee pain.   Aortic insufficiency: Mild, noted on prior echocardiogram. No symptoms of CHF.  2. Others  Check BP at home or at drug store. Would like to see BP at least< 140/90, but closer to 120/80. Recheck in the office showed blood pressure of 126/78.  Followup in one year.  LDL 114 in 2011.     Signed, Fredric MareJay S. Cidney Kirkwood, MD, [facility] 07/22/2013 9:18 AM

## 2013-09-26 ENCOUNTER — Encounter: Payer: Managed Care, Other (non HMO) | Admitting: Internal Medicine

## 2013-10-02 ENCOUNTER — Encounter: Payer: Self-pay | Admitting: Internal Medicine

## 2013-10-02 ENCOUNTER — Other Ambulatory Visit (INDEPENDENT_AMBULATORY_CARE_PROVIDER_SITE_OTHER): Payer: Managed Care, Other (non HMO)

## 2013-10-02 ENCOUNTER — Ambulatory Visit (INDEPENDENT_AMBULATORY_CARE_PROVIDER_SITE_OTHER): Payer: Managed Care, Other (non HMO) | Admitting: Internal Medicine

## 2013-10-02 ENCOUNTER — Ambulatory Visit (INDEPENDENT_AMBULATORY_CARE_PROVIDER_SITE_OTHER)
Admission: RE | Admit: 2013-10-02 | Discharge: 2013-10-02 | Disposition: A | Discharge status: Home / Self Care | Payer: Managed Care, Other (non HMO) | Source: Ambulatory Visit | Attending: Internal Medicine | Admitting: Internal Medicine

## 2013-10-02 VITALS — BP 140/86 | HR 64 | Temp 97.1°F | Resp 16 | Ht 72.0 in | Wt 177.8 lb

## 2013-10-02 DIAGNOSIS — M1710 Unilateral primary osteoarthritis, unspecified knee: Secondary | ICD-10-CM

## 2013-10-02 DIAGNOSIS — M25569 Pain in unspecified knee: Secondary | ICD-10-CM

## 2013-10-02 DIAGNOSIS — IMO0002 Reserved for concepts with insufficient information to code with codable children: Secondary | ICD-10-CM

## 2013-10-02 DIAGNOSIS — R0989 Other specified symptoms and signs involving the circulatory and respiratory systems: Secondary | ICD-10-CM

## 2013-10-02 DIAGNOSIS — I359 Nonrheumatic aortic valve disorder, unspecified: Secondary | ICD-10-CM

## 2013-10-02 DIAGNOSIS — M25561 Pain in right knee: Secondary | ICD-10-CM

## 2013-10-02 DIAGNOSIS — M1711 Unilateral primary osteoarthritis, right knee: Secondary | ICD-10-CM

## 2013-10-02 DIAGNOSIS — I059 Rheumatic mitral valve disease, unspecified: Secondary | ICD-10-CM

## 2013-10-02 DIAGNOSIS — Z0000 Encounter for general adult medical examination without abnormal findings: Secondary | ICD-10-CM

## 2013-10-02 DIAGNOSIS — R0609 Other forms of dyspnea: Secondary | ICD-10-CM

## 2013-10-02 DIAGNOSIS — R0600 Dyspnea, unspecified: Secondary | ICD-10-CM | POA: Insufficient documentation

## 2013-10-02 LAB — URINALYSIS, ROUTINE W REFLEX MICROSCOPIC
Bilirubin Urine: NEGATIVE
Hgb urine dipstick: NEGATIVE
Ketones, ur: NEGATIVE
Leukocytes, UA: NEGATIVE
Nitrite: NEGATIVE
RBC / HPF: NONE SEEN (ref 0–?)
Specific Gravity, Urine: 1.02 (ref 1.000–1.030)
Total Protein, Urine: NEGATIVE
Urine Glucose: NEGATIVE
Urobilinogen, UA: 0.2 (ref 0.0–1.0)
WBC, UA: NONE SEEN (ref 0–?)
pH: 6 (ref 5.0–8.0)

## 2013-10-02 LAB — CBC WITH DIFFERENTIAL/PLATELET
Basophils Absolute: 0 10*3/uL (ref 0.0–0.1)
Basophils Relative: 0.4 % (ref 0.0–3.0)
Eosinophils Absolute: 0.2 10*3/uL (ref 0.0–0.7)
Eosinophils Relative: 3 % (ref 0.0–5.0)
HCT: 44.8 % (ref 39.0–52.0)
Hemoglobin: 15.1 g/dL (ref 13.0–17.0)
Lymphocytes Relative: 26.8 % (ref 12.0–46.0)
Lymphs Abs: 1.5 10*3/uL (ref 0.7–4.0)
MCHC: 33.7 g/dL (ref 30.0–36.0)
MCV: 89.7 fl (ref 78.0–100.0)
Monocytes Absolute: 0.6 10*3/uL (ref 0.1–1.0)
Monocytes Relative: 10.3 % (ref 3.0–12.0)
Neutro Abs: 3.4 10*3/uL (ref 1.4–7.7)
Neutrophils Relative %: 59.5 % (ref 43.0–77.0)
Platelets: 210 10*3/uL (ref 150.0–400.0)
RBC: 4.99 Mil/uL (ref 4.22–5.81)
RDW: 13.1 % (ref 11.5–15.5)
WBC: 5.7 10*3/uL (ref 4.0–10.5)

## 2013-10-02 LAB — COMPREHENSIVE METABOLIC PANEL
ALT: 38 U/L (ref 0–53)
AST: 20 U/L (ref 0–37)
Albumin: 4.3 g/dL (ref 3.5–5.2)
Alkaline Phosphatase: 65 U/L (ref 39–117)
BUN: 16 mg/dL (ref 6–23)
CO2: 28 mEq/L (ref 19–32)
Calcium: 9.2 mg/dL (ref 8.4–10.5)
Chloride: 106 mEq/L (ref 96–112)
Creatinine, Ser: 1.1 mg/dL (ref 0.4–1.5)
GFR: 73.54 mL/min (ref >60.00)
Glucose, Bld: 95 mg/dL (ref 70–99)
Potassium: 4.3 mEq/L (ref 3.5–5.1)
Sodium: 139 mEq/L (ref 135–145)
Total Bilirubin: 0.8 mg/dL (ref 0.2–1.2)
Total Protein: 6.9 g/dL (ref 6.0–8.3)

## 2013-10-02 LAB — LIPID PANEL
Cholesterol: 197 mg/dL (ref 0–200)
HDL: 46.8 mg/dL (ref >39.00)
LDL Cholesterol: 136 mg/dL — ABNORMAL HIGH (ref 0–99)
Total CHOL/HDL Ratio: 4
Triglycerides: 70 mg/dL (ref 0.0–149.0)
VLDL: 14 mg/dL (ref 0.0–40.0)

## 2013-10-02 LAB — FECAL OCCULT BLOOD, GUAIAC: Fecal Occult Blood: NEGATIVE

## 2013-10-02 LAB — TSH: TSH: 1.54 u[IU]/mL (ref 0.35–4.50)

## 2013-10-02 NOTE — Assessment & Plan Note (Signed)
EKG show LAE which was present on the prior EKG Hehas developed DOE and wants to f/up with his cardiologist

## 2013-10-02 NOTE — Patient Instructions (Signed)
Health Maintenance, Males A healthy lifestyle and preventative care can promote health and wellness.  Maintain regular health, dental, and eye exams.  Eat a healthy diet. Foods like vegetables, fruits, whole grains, low-fat dairy products, and lean protein foods contain the nutrients you need and are low in calories. Decrease your intake of foods high in solid fats, added sugars, and salt. Get information about a proper diet from your health care provider, if necessary.  Regular physical exercise is one of the most important things you can do for your health. Most adults should get at least 150 minutes of moderate-intensity exercise (any activity that increases your heart rate and causes you to sweat) each week. In addition, most adults need muscle-strengthening exercises on 2 or more days a week.   Maintain a healthy weight. The body mass index (BMI) is a screening tool to identify possible weight problems. It provides an estimate of body fat based on height and weight. Your health care provider can find your BMI and can help you achieve or maintain a healthy weight. For males 20 years and older:  A BMI below 18.5 is considered underweight.  A BMI of 18.5 to 24.9 is normal.  A BMI of 25 to 29.9 is considered overweight.  A BMI of 30 and above is considered obese.  Maintain normal blood lipids and cholesterol by exercising and minimizing your intake of saturated fat. Eat a balanced diet with plenty of fruits and vegetables. Blood tests for lipids and cholesterol should begin at age 20 and be repeated every 5 years. If your lipid or cholesterol levels are high, you are over 50, or you are at high risk for heart disease, you may need your cholesterol levels checked more frequently.Ongoing high lipid and cholesterol levels should be treated with medicines, if diet and exercise are not working.  If you smoke, find out from your health care provider how to quit. If you do not use tobacco, do not  start.  Lung cancer screening is recommended for adults aged 55 80 years who are at high risk for developing lung cancer because of a history of smoking. A yearly low-dose CT scan of the lungs is recommended for people who have at least a 30-pack-year history of smoking and are a current smoker or have quit within the past 15 years. A pack year of smoking is smoking an average of 1 pack of cigarettes a day for 1 year (for example, a 30-pack-year history of smoking could mean smoking 1 pack a day for 30 years or 2 packs a day for 15 years). Yearly screening should continue until the smoker has stopped smoking for at least 15 years. Yearly screening should be stopped for people who develop a health problem that would prevent them from having lung cancer treatment.  If you choose to drink alcohol, do not have more than 2 drinks per day. One drink is considered to be 12 oz (360 mL) of beer, 5 oz (150 mL) of wine, or 1.5 oz (45 mL) of liquor.  Avoid use of street drugs. Do not share needles with anyone. Ask for help if you need support or instructions about stopping the use of drugs.  High blood pressure causes heart disease and increases the risk of stroke. Blood pressure should be checked at least every 1 2 years. Ongoing high blood pressure should be treated with medicines if weight loss and exercise are not effective.  If you are 45 54 years old, ask your health   care provider if you should take aspirin to prevent heart disease.  Diabetes screening involves taking a blood sample to check your fasting blood sugar level. This should be done once every 3 years after age 45, if you are at a normal weight and without risk factors for diabetes. Testing should be considered at a younger age or be carried out more frequently if you are overweight and have at least 1 risk factor for diabetes.  Colorectal cancer can be detected and often prevented. Most routine colorectal cancer screening begins at the age of 50  and continues through age 75. However, your health care provider may recommend screening at an earlier age if you have risk factors for colon cancer. On a yearly basis, your health care provider may provide home test kits to check for hidden blood in the stool. A small camera at the end of a tube may be used to directly examine the colon (sigmoidoscopy or colonoscopy) to detect the earliest forms of colorectal cancer. Talk to your health care provider about this at age 50, when routine screening begins. A direct exam of the colon should be repeated every 5 10 years through age 75, unless early forms of pre-cancerous polyps or small growths are found.  People who are at an increased risk for hepatitis B should be screened for this virus. You are considered at high risk for hepatitis B if:  You were born in a country where hepatitis B occurs often. Talk with your health care provider about which countries are considered high-risk.  Your parents were born in a high-risk country and you have not received a shot to protect against hepatitis B (hepatitis B vaccine).  You have HIV or AIDS.  You use needles to inject street drugs.  You live with, or have sex with, someone who has hepatitis B.  You are a man who has sex with other men (MSM).  You get hemodialysis treatment.  You take certain medicines for conditions like cancer, organ transplantation, and autoimmune conditions.  Hepatitis C blood testing is recommended for all people born from 1945 through 1965 and any individual with known risk factors for hepatitis C.  Healthy men should no longer receive prostate-specific antigen (PSA) blood tests as part of routine cancer screening. Talk to your health care provider about prostate cancer screening.  Testicular cancer screening is not recommended for adolescents or adult males who have no symptoms. Screening includes self-exam, a health care provider exam, and other screening tests. Consult with  your health care provider about any symptoms you have or any concerns you have about testicular cancer.  Practice safe sex. Use condoms and avoid high-risk sexual practices to reduce the spread of sexually transmitted infections (STIs).  Use sunscreen. Apply sunscreen liberally and repeatedly throughout the day. You should seek shade when your shadow is shorter than you. Protect yourself by wearing long sleeves, pants, a wide-brimmed hat, and sunglasses year round, whenever you are outdoors.  Tell your health care provider of new moles or changes in moles, especially if there is a change in shape or color. Also tell your provider if a mole is larger than the size of a pencil eraser.  A one-time screening for abdominal aortic aneurysm (AAA) and surgical repair of large AAAs by ultrasound is recommended for men aged 65 75 years who are current or former smokers.  Stay current with your vaccines (immunizations). Document Released: 11/11/2007 Document Revised: 03/05/2013 Document Reviewed: 10/10/2010 [employer] Patient Information 2014 [employer].   Knee Pain Knee pain can be a result of an injury or other medical conditions. Treatment will depend on the cause of your pain. HOME CARE  Only take medicine as told by your doctor.  Keep a healthy weight. Being overweight can make the knee hurt more.  Stretch before exercising or playing sports.  If there is constant knee pain, change the way you exercise. Ask your doctor for advice.  Make sure shoes fit well. Choose the right shoe for the sport or activity.  Protect your knees. Wear kneepads if needed.  Rest when you are tired. GET HELP RIGHT AWAY IF:   Your knee pain does not stop.  Your knee pain does not get better.  Your knee joint feels hot to the touch.  You have a fever. MAKE SURE YOU:   Understand these instructions.  Will watch this condition.  Will get help right away if you are not doing well or get worse. Document  Released: 08/11/2008 Document Revised: 08/07/2011 Document Reviewed: 08/11/2008 [facility] Patient Information 2014 [employer] [place].

## 2013-10-02 NOTE — Assessment & Plan Note (Signed)
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

## 2013-10-02 NOTE — Assessment & Plan Note (Signed)
Plain film is normal I have asked him to start PT

## 2013-10-02 NOTE — Progress Notes (Signed)
Pre visit review using our clinic review tool, if applicable. No additional management support is needed unless otherwise documented below in the visit note. 

## 2013-10-02 NOTE — Assessment & Plan Note (Signed)
Exam done Vaccines were reviewed Labs ordered Pt ed material was given 

## 2013-10-02 NOTE — Progress Notes (Signed)
Subjective:    Patient ID: Cody Barnes, male    DOB: 03/12/1961, 53 y.o.   MRN: [id number]  HPI Comments: He comes in today for a complete physical but he also complains that over the last 2 months he has developed DOE. Also, he has had some pain in his right knee when he climbs stairs.     Review of Systems  Constitutional: Negative.  Negative for fever, chills, diaphoresis, activity change, appetite change, fatigue and unexpected weight change.  HENT: Negative.   Eyes: Negative.   Respiratory: Positive for shortness of breath. Negative for apnea, cough, choking, chest tightness, wheezing and stridor.   Cardiovascular: Negative.  Negative for chest pain, palpitations and leg swelling.  Gastrointestinal: Negative.  Negative for abdominal pain.  Endocrine: Negative.   Genitourinary: Negative.   Musculoskeletal: Negative.   Skin: Negative.   Allergic/Immunologic: Negative.   Neurological: Negative.  Negative for dizziness, syncope, weakness, light-headedness, numbness and headaches.  Hematological: Negative.  Negative for adenopathy. Does not bruise/bleed easily.  Psychiatric/Behavioral: Negative.        Objective:   Physical Exam  Vitals reviewed. Constitutional: He is oriented to person, place, and time. He appears well-developed and well-nourished. No distress.  HENT:  Head: Normocephalic and atraumatic.  Mouth/Throat: Oropharynx is clear and moist. No oropharyngeal exudate.  Eyes: Conjunctivae are normal. Right eye exhibits no discharge. Left eye exhibits no discharge. No scleral icterus.  Neck: Normal range of motion. Neck supple. No JVD present. No tracheal deviation present. No thyromegaly present.  Cardiovascular: Normal rate, regular rhythm, S1 normal, S2 normal and intact distal pulses.  Exam reveals no gallop, no S3, no S4, no distant heart sounds and no friction rub.   Murmur heard.  No systolic murmur is present   Decrescendo diastolic murmur is present with a  grade of 1/6  Pulses:      Carotid pulses are 1+ on the right side, and 1+ on the left side.      Radial pulses are 1+ on the right side, and 1+ on the left side.       Femoral pulses are 1+ on the right side, and 1+ on the left side.      Popliteal pulses are 1+ on the right side, and 1+ on the left side.       Dorsalis pedis pulses are 1+ on the right side, and 1+ on the left side.       Posterior tibial pulses are 1+ on the right side, and 1+ on the left side.  Pulmonary/Chest: Effort normal and breath sounds normal. No stridor. No respiratory distress. He has no wheezes. He has no rales. He exhibits no tenderness.  Abdominal: Soft. Bowel sounds are normal. He exhibits no distension and no mass. There is no tenderness. There is no rebound and no guarding. Hernia confirmed negative in the right inguinal area and confirmed negative in the left inguinal area.  Genitourinary: Rectum normal, prostate normal, testes normal and penis normal. Rectal exam shows no external hemorrhoid, no internal hemorrhoid, no fissure, no mass, no tenderness and anal tone normal. Guaiac negative stool. Prostate is not enlarged and not tender. Right testis shows no mass, no swelling and no tenderness. Right testis is descended. Left testis shows no mass, no swelling and no tenderness. Left testis is descended. Circumcised. No penile erythema or penile tenderness. No discharge found.  Musculoskeletal: Normal range of motion. He exhibits no edema and no tenderness.  Right knee: Normal. He exhibits normal range of motion, no swelling, no effusion, no ecchymosis, no deformity, no laceration, no erythema, normal alignment, no LCL laxity, normal patellar mobility and no bony tenderness. No tenderness found.  Lymphadenopathy:    He has no cervical adenopathy.       Right: No inguinal adenopathy present.       Left: No inguinal adenopathy present.  Neurological: He is oriented to person, place, and time.  Skin: Skin is warm  and dry. No rash noted. He is not diaphoretic. No erythema. No pallor.  Psychiatric: He has a normal mood and affect. His behavior is normal. Judgment and thought content normal.     Lab Results  Component Value Date   WBC 7.0 09/25/2012   HGB 15.6 09/25/2012   HCT 46.2 09/25/2012   PLT 215.0 09/25/2012   GLUCOSE 93 09/25/2012   CHOL 181 09/25/2012   TRIG 102.0 09/25/2012   HDL 44.80 09/25/2012   LDLCALC 116* 09/25/2012   ALT 34 09/25/2012   AST 20 09/25/2012   NA 138 09/25/2012   K 4.5 09/25/2012   CL 104 09/25/2012   CREATININE 1.0 09/25/2012   BUN 17 09/25/2012   CO2 29 09/25/2012   TSH 0.99 09/25/2012   PSA 0.94 09/25/2012       Assessment & Plan:

## 2013-10-02 NOTE — Assessment & Plan Note (Signed)
Labs are normal I have asked him to see cardiology for further evaluation

## 2013-10-15 ENCOUNTER — Ambulatory Visit: Payer: Managed Care, Other (non HMO) | Attending: Internal Medicine

## 2013-10-15 DIAGNOSIS — R5381 Other malaise: Secondary | ICD-10-CM | POA: Insufficient documentation

## 2013-10-15 DIAGNOSIS — M25569 Pain in unspecified knee: Secondary | ICD-10-CM | POA: Insufficient documentation

## 2013-10-15 DIAGNOSIS — IMO0001 Reserved for inherently not codable concepts without codable children: Secondary | ICD-10-CM | POA: Insufficient documentation

## 2013-10-22 ENCOUNTER — Encounter: Payer: Self-pay | Admitting: Internal Medicine

## 2013-10-22 ENCOUNTER — Ambulatory Visit (INDEPENDENT_AMBULATORY_CARE_PROVIDER_SITE_OTHER): Payer: Managed Care, Other (non HMO) | Admitting: Internal Medicine

## 2013-10-22 ENCOUNTER — Ambulatory Visit: Payer: Managed Care, Other (non HMO) | Admitting: Physical Therapy

## 2013-10-22 VITALS — BP 116/76 | HR 61 | Temp 97.6°F | Wt 177.4 lb

## 2013-10-22 DIAGNOSIS — H109 Unspecified conjunctivitis: Secondary | ICD-10-CM

## 2013-10-22 DIAGNOSIS — J309 Allergic rhinitis, unspecified: Secondary | ICD-10-CM

## 2013-10-22 MED ORDER — ERYTHROMYCIN 5 MG/GM OP OINT: TOPICAL_OINTMENT | OPHTHALMIC | Status: DC

## 2013-10-22 NOTE — Progress Notes (Signed)
   Subjective:    Patient ID: Cody Barnes, male    DOB: December 23, 1960, 53 y.o.   MRN: [id number]  HPI  Symptoms began 10/16/13 as sore throat, rhinitis, head congestion, & itchy, watery eyes with sneezing.  Symptoms worsened after he worked in the yard 5/25.  Tylenol has not been of benefit. Zyrtec-D over the weekend did help.  Symptoms worsened last night. He continues to have sore throat ;he also had earache on the right but this is not present this morning. He now has a dry cough.  In addition to itchy, watery eyes; his eyes were crusted shut this morning.  Review of Systems  He specifically denies frontal headache, facial pain, nasal purulence, purulent sputum, wheezing, shortness breath, fever, chills, or sweats.          Objective:   Physical Exam General appearance:good health ;well nourished; no acute distress or increased work of breathing is present.  No  lymphadenopathy about the head, neck, or axilla noted.   Eyes:Conjunctivitis bilaterally, OS greater than OD. Extraocular motion is intact without pain. Vision is normal with glasses   Ears:  External ear exam shows no significant lesions or deformities.  Otoscopic examination reveals clear canals, tympanic membranes are intact bilaterally without bulging, retraction, inflammation or discharge.  Nose:  External nasal examination shows no deformity or inflammation. Nasal mucosa are pink, boggy and moist without lesions or exudates. No septal dislocation or deviation.No obstruction to airflow. Hyponasal speech pattern  Oral exam: Dental hygiene is good; lips and gums are healthy appearing.There is no oropharyngeal erythema or exudate noted.   Neck:  No deformities, thyromegaly, masses, or tenderness noted.   Supple with full range of motion without pain.   Heart:  Normal rate and regular rhythm. S1 and S2 normal without gallop, murmur, click, rub or other extra sounds.   Lungs:Chest clear to auscultation; no wheezes,  rhonchi,rales ,or rubs present.No increased work of breathing.    Extremities:  No cyanosis, edema, or clubbing  noted    Skin: Warm & dry w/o  tenting.         Assessment & Plan:  #1 rhinitis #2 conjunctivitis See orders

## 2013-10-22 NOTE — Patient Instructions (Signed)

## 2013-10-22 NOTE — Progress Notes (Signed)
Pre visit review using our clinic review tool, if applicable. No additional management support is needed unless otherwise documented below in the visit note. 

## 2013-11-04 ENCOUNTER — Ambulatory Visit (INDEPENDENT_AMBULATORY_CARE_PROVIDER_SITE_OTHER): Payer: Managed Care, Other (non HMO) | Admitting: Interventional Cardiology

## 2013-11-04 ENCOUNTER — Encounter: Payer: Self-pay | Admitting: Interventional Cardiology

## 2013-11-04 VITALS — BP 104/64 | HR 59 | Ht 72.0 in | Wt 177.2 lb

## 2013-11-04 DIAGNOSIS — I359 Nonrheumatic aortic valve disorder, unspecified: Secondary | ICD-10-CM

## 2013-11-04 DIAGNOSIS — R0609 Other forms of dyspnea: Secondary | ICD-10-CM

## 2013-11-04 DIAGNOSIS — R0602 Shortness of breath: Secondary | ICD-10-CM

## 2013-11-04 DIAGNOSIS — I059 Rheumatic mitral valve disease, unspecified: Secondary | ICD-10-CM

## 2013-11-04 DIAGNOSIS — R0600 Dyspnea, unspecified: Secondary | ICD-10-CM

## 2013-11-04 DIAGNOSIS — R0989 Other specified symptoms and signs involving the circulatory and respiratory systems: Secondary | ICD-10-CM

## 2013-11-04 NOTE — Patient Instructions (Signed)
Your physician recommends that you return for lab work today for bnp and bmet.  Your physician recommends that you have a chest x-ray.  Your physician has requested that you have an echocardiogram (move echo from August up to now). Echocardiography is a painless test that uses sound waves to create images of your heart. It provides your doctor with information about the size and shape of your heart and how well your heart's chambers and valves are working. This procedure takes approximately one hour. There are no restrictions for this procedure.

## 2013-11-04 NOTE — Progress Notes (Signed)
Patient ID: Cody Barnes, male   DOB: Jan 24, 1961, 53 y.o.   MRN: [id number]  [address] [place], [place]  [id number] Phone: [phone]) [phone] Fax:  [phone]) [phone]  Date:  11/04/2013   ID:  Cody Barnes, DOB Jan 24, 1961, MRN [id number]  PCP:  Sanda Lingerhomas Jones, MD      History of Present Illness: Cody Barnes is a 53 y.o. male who has significant mitral regurgitation. Dr. Yetta BarreJones wants him to get evaluated for changes in his EKG. Reports SHOB with exertion x 2-3 months. Has to stop activity to catch his breath. Reports fatigue occasionally, especially at the end of the day.  Murmur:  Denies : Chest pain.  Dizziness.  Syncope Fatigue.  Leg edema.  Orthopnea (Sleeps on 2 pillows)  Palpitations.  PND  Syncope.     Wt Readings from Last 3 Encounters:  11/04/13 177 lb 3.2 oz (80.377 kg)  10/22/13 177 lb 6.4 oz (80.468 kg)  10/02/13 177 lb 12 oz (80.627 kg)     Past Medical History  Diagnosis Date  . Heart murmur   . Mitral valve prolapse     No current outpatient prescriptions on file.   No current facility-administered medications for this visit.    Allergies:   No Known Allergies  Social History:  The patient  reports that he has never smoked. He has never used smokeless tobacco. He reports that he drinks about 3 ounces of alcohol per week. He reports that he does not use illicit drugs.   Family History:  The patient's family history is negative for Cancer, Drug abuse, Early death, Heart disease, Hyperlipidemia, Hypertension, Kidney disease, Stroke, and Arthritis.   ROS:  Please see the history of present illness.  No nausea, vomiting.  No fevers, chills.  No focal weakness.  No dysuria.    All other systems reviewed and negative.   PHYSICAL EXAM: VS:  BP 104/64  Pulse 59  Ht 6' (1.829 m)  Wt 177 lb 3.2 oz (80.377 kg)  BMI 24.03 kg/m2 Well nourished, well developed, in no acute distress HEENT: normal Neck: no JVD, no carotid bruits Cardiac:  normal  S1, S2; RRR; 2/6 systolic murmur, 2/6 holodiastolic murmur Lungs:  clear to auscultation bilaterally, no wheezing, rhonchi or rales Abd: soft, nontender, no hepatomegaly Ext: no edema Skin: warm and dry Neuro:   no focal abnormalities noted  EKG:  NSR, PVCs, no significant ST segment changes     ASSESSMENT AND PLAN:  Mitral Regurgitation  Diagnostic Imaging:EKG Harward,Amy 07/09/2012 09:02:15 AM > Dessie Tatem,JAY 07/09/2012 09:28:36 AM > NSR, no ST segment changes, EC Echocardiogram (Ordered for 11/05/2012)  Plan to move up echocardiogram.. Concern for worsening mitral regurgitation given decreased exercise tolerance while walking upstairs..  COntinue SBE prophylaxis.   He is still trying higher level exercise. No problems with usual activities.  Continue to try to increase exercise duration. Currently most limited by knee pain.   Aortic insufficiency: Mild, noted on prior echocardiogram. No symptoms of CHF.  2. Others  Check BP at home or at drug store. Would like to see BP at least< 140/90, but closer to 120/80. Recheck in the office showed blood pressure of 126/78.  LDL 114 in 2011.  We'll plan for chest x-ray and BNP as well to evaluate for fluid overload. Unclear why he is having some shortness of breath. He does have allergic rhinitis. This season has been difficult on his allergies.     Signed, Fredric MareJay S. Rukia Mcgillivray, MD,  [facility] 11/04/2013 4:30 PM

## 2013-11-05 LAB — BASIC METABOLIC PANEL
BUN: 21 mg/dL (ref 6–23)
CO2: 25 mEq/L (ref 19–32)
Calcium: 8.8 mg/dL (ref 8.4–10.5)
Chloride: 108 mEq/L (ref 96–112)
Creatinine, Ser: 1.2 mg/dL (ref 0.4–1.5)
GFR: 69.87 mL/min (ref >60.00)
Glucose, Bld: 106 mg/dL — ABNORMAL HIGH (ref 70–99)
Potassium: 3.7 mEq/L (ref 3.5–5.1)
Sodium: 138 mEq/L (ref 135–145)

## 2013-11-05 LAB — BRAIN NATRIURETIC PEPTIDE: Pro B Natriuretic peptide (BNP): 32 pg/mL (ref 0.0–100.0)

## 2013-11-13 ENCOUNTER — Ambulatory Visit
Admission: RE | Admit: 2013-11-13 | Discharge: 2013-11-13 | Disposition: A | Discharge status: Home / Self Care | Payer: 59 | Source: Ambulatory Visit | Attending: Interventional Cardiology | Admitting: Interventional Cardiology

## 2013-11-13 ENCOUNTER — Ambulatory Visit (HOSPITAL_COMMUNITY)
Admission: RE | Admit: 2013-11-13 | Discharge: 2013-11-13 | Disposition: A | Discharge status: Home / Self Care | Payer: Managed Care, Other (non HMO) | Source: Ambulatory Visit | Attending: Cardiovascular Disease | Admitting: Cardiovascular Disease

## 2013-11-13 DIAGNOSIS — I059 Rheumatic mitral valve disease, unspecified: Secondary | ICD-10-CM

## 2013-11-13 DIAGNOSIS — R0602 Shortness of breath: Secondary | ICD-10-CM

## 2013-11-13 DIAGNOSIS — I359 Nonrheumatic aortic valve disorder, unspecified: Secondary | ICD-10-CM

## 2013-11-13 NOTE — Progress Notes (Signed)
2D Echocardiogram Complete.  11/13/2013   Bethany McMahill, RDCS  

## 2013-11-14 ENCOUNTER — Encounter: Payer: Self-pay | Admitting: Interventional Cardiology

## 2013-11-14 ENCOUNTER — Telehealth: Payer: Self-pay | Admitting: *Deleted

## 2013-11-14 NOTE — Telephone Encounter (Signed)
Left message on pt's identified voicemail that Dr. Eldridge DaceVaranasi would like him to have TEE at this time. I left message for him to call back on Monday and we could arrange this for him

## 2013-11-14 NOTE — Telephone Encounter (Signed)
Spoke with pt about my chart message he sent requesting explanation of echo results. I spoke with pt and reviewed results and recommendations from Dr. Eldridge DaceVaranasi with him.  Pt states [facility]. Thinks this was around 2012.  He wanted to make Dr. Eldridge DaceVaranasi aware of this and see if he recommends another TEE at this time.

## 2013-11-14 NOTE — Telephone Encounter (Signed)
Prior TEE was 2007.  Given that the LV chamber has increased in size slightly from prior, I think the TEE wil be helpful.  The mitral regurgitation is noted only as mild on the most recent echo, but the leak is eccentric and can be underestimated by regular transthoracic echo.  That is why a TEE will give a much better look at the valve.

## 2013-11-17 NOTE — Telephone Encounter (Signed)
Follow up    Patient returning call back to nurse from Friday.

## 2013-11-17 NOTE — Telephone Encounter (Signed)
Spoke with pt and reviewed recommendations from Dr. Eldridge DaceVaranasi for TEE. I told him I would forward message to Amy to contact him tomorrow when she returns to office to schedule this.

## 2013-11-18 ENCOUNTER — Encounter: Payer: Self-pay | Admitting: Cardiology

## 2013-11-18 NOTE — Telephone Encounter (Signed)
TEE scheduled for 11-25-13 with Dr. Mayford Knifeurner. Pt notified and instructions given. No labwork needed since pt recently had bmet per Dr. Mayford Knifeurner.

## 2013-11-21 ENCOUNTER — Encounter (HOSPITAL_COMMUNITY): Payer: Self-pay

## 2013-11-25 ENCOUNTER — Ambulatory Visit (HOSPITAL_COMMUNITY)
Admission: RE | Admit: 2013-11-25 | Discharge: 2013-11-25 | Disposition: A | Discharge status: Home / Self Care | Payer: 59 | Source: Ambulatory Visit | Attending: Cardiology | Admitting: Cardiology

## 2013-11-25 ENCOUNTER — Encounter (HOSPITAL_COMMUNITY)
Admission: RE | Disposition: A | Discharge status: Home / Self Care | Payer: Self-pay | Source: Ambulatory Visit | Attending: Cardiology

## 2013-11-25 DIAGNOSIS — R002 Palpitations: Secondary | ICD-10-CM | POA: Insufficient documentation

## 2013-11-25 DIAGNOSIS — R0601 Orthopnea: Secondary | ICD-10-CM | POA: Insufficient documentation

## 2013-11-25 DIAGNOSIS — I079 Rheumatic tricuspid valve disease, unspecified: Secondary | ICD-10-CM | POA: Insufficient documentation

## 2013-11-25 DIAGNOSIS — I059 Rheumatic mitral valve disease, unspecified: Secondary | ICD-10-CM

## 2013-11-25 DIAGNOSIS — R011 Cardiac murmur, unspecified: Secondary | ICD-10-CM | POA: Insufficient documentation

## 2013-11-25 DIAGNOSIS — I359 Nonrheumatic aortic valve disorder, unspecified: Secondary | ICD-10-CM

## 2013-11-25 DIAGNOSIS — J309 Allergic rhinitis, unspecified: Secondary | ICD-10-CM | POA: Insufficient documentation

## 2013-11-25 DIAGNOSIS — I080 Rheumatic disorders of both mitral and aortic valves: Secondary | ICD-10-CM | POA: Insufficient documentation

## 2013-11-25 DIAGNOSIS — R609 Edema, unspecified: Secondary | ICD-10-CM | POA: Insufficient documentation

## 2013-11-25 HISTORY — PX: TEE WITHOUT CARDIOVERSION: SHX5443

## 2013-11-25 SURGERY — ECHOCARDIOGRAM, TRANSESOPHAGEAL
Anesthesia: Moderate Sedation

## 2013-11-25 MED ORDER — FENTANYL CITRATE 0.05 MG/ML IJ SOLN
INTRAMUSCULAR | Status: DC | PRN
  Administered 2013-11-25 (×2): 25 ug via INTRAVENOUS

## 2013-11-25 MED ORDER — LIDOCAINE VISCOUS 2 % MT SOLN
OROMUCOSAL | Status: DC | PRN
  Administered 2013-11-25: 10 mL via OROMUCOSAL

## 2013-11-25 MED ORDER — DIPHENHYDRAMINE HCL 50 MG/ML IJ SOLN
INTRAMUSCULAR | Status: AC
  Filled 2013-11-25: qty 1

## 2013-11-25 MED ORDER — LIDOCAINE VISCOUS 2 % MT SOLN
OROMUCOSAL | Status: AC
  Filled 2013-11-25: qty 15

## 2013-11-25 MED ORDER — SODIUM CHLORIDE 0.9 % IV SOLN: INTRAVENOUS | Status: DC

## 2013-11-25 MED ORDER — MIDAZOLAM HCL 10 MG/2ML IJ SOLN
INTRAMUSCULAR | Status: DC | PRN
  Administered 2013-11-25 (×2): 2 mg via INTRAVENOUS

## 2013-11-25 MED ORDER — BUTAMBEN-TETRACAINE-BENZOCAINE 2-2-14 % EX AERO
INHALATION_SPRAY | CUTANEOUS | Status: DC | PRN
  Administered 2013-11-25: 2 via TOPICAL

## 2013-11-25 MED ORDER — FENTANYL CITRATE 0.05 MG/ML IJ SOLN
INTRAMUSCULAR | Status: AC
  Filled 2013-11-25: qty 2

## 2013-11-25 MED ORDER — MIDAZOLAM HCL 5 MG/ML IJ SOLN
INTRAMUSCULAR | Status: AC
  Filled 2013-11-25: qty 2

## 2013-11-25 NOTE — CV Procedure (Signed)
   PROCEDURE NOTE  Procedure:  Transesophageal echocardiogram Operator:  Armanda Magicraci Turner, MD Indications:  Mitral valve prolapse with MR Complications:  None IV Meds:  Versed 4mg  and Fentanyl 50mcg IV, 5cc viscous lidocaine  Results: Normal LV size with low normal LVF EF 50% Normal RV size and function Normal TV with mild TR Normal PV with trivial PR Trileaflet AV with moderate AR Significant holosystolic prolapse of the posterior MV leaflet with very eccentric mitral regurgitation that is directed at the anterior left atrial wall and wraps around the posterior wall of the LA.  There was no obvious flow reversal in the pulmonary vein.  MR is difficult to assess due to the eccentricity of the jet but there appears to be several small jets and there is at least moderate and possibly moderate to severe MR. Normal LA and LAA Normal interatrial septum with no evidence of shunt by colorflow doppler Normal ascending and thoracic aorta.  The patient tolerated the procedure well and was transferred back to his room in stable condition.

## 2013-11-25 NOTE — Interval H&P Note (Signed)
History and Physical Interval Note:  11/25/2013 7:50 AM  Cody Barnes  has presented today for surgery, with the diagnosis of MTIRAL VALVE REGURGITATIION  The various methods of treatment have been discussed with the patient and family. After consideration of risks, benefits and other options for treatment, the patient has consented to  Procedure(s): TRANSESOPHAGEAL ECHOCARDIOGRAM (TEE) (N/A) as a surgical intervention .  The patient's history has been reviewed, patient examined, no change in status, stable for surgery.  I have reviewed the patient's chart and labs.  Questions were answered to the patient's satisfaction.     TURNER,TRACI R

## 2013-11-25 NOTE — Progress Notes (Signed)
Echocardiogram Echocardiogram Transesophageal has been performed.  Dorothey BasemanReel, Talayla Doyel M 11/25/2013, 9:13 AM

## 2013-11-25 NOTE — Interval H&P Note (Signed)
History and Physical Interval Note:  11/25/2013 9:19 AM  Cody Barnes  has presented today for surgery, with the diagnosis of MTIRAL VALVE REGURGITATIION  The various methods of treatment have been discussed with the patient and family. After consideration of risks, benefits and other options for treatment, the patient has consented to  Procedure(s): TRANSESOPHAGEAL ECHOCARDIOGRAM (TEE) (N/A) as a surgical intervention .  The patient's history has been reviewed, patient examined, no change in status, stable for surgery.  I have reviewed the patient's chart and labs.  Questions were answered to the patient's satisfaction.     TURNER,TRACI R

## 2013-11-25 NOTE — Discharge Instructions (Signed)
Conscious Sedation, Adult, Care After °Refer to this sheet in the next few weeks. These instructions provide you with information on caring for yourself after your procedure. Your health care provider may also give you more specific instructions. Your treatment has been planned according to current medical practices, but problems sometimes occur. Call your health care provider if you have any problems or questions after your procedure. °WHAT TO EXPECT AFTER THE PROCEDURE  °After your procedure: °· You may feel sleepy, clumsy, and have poor balance for several hours. °· Vomiting may occur if you eat too soon after the procedure. °HOME CARE INSTRUCTIONS °· Do not participate in any activities where you could become injured for at least 24 hours. Do not: °¨ Drive. °¨ Swim. °¨ Ride a bicycle. °¨ Operate heavy machinery. °¨ Cook. °¨ Use power tools. °¨ Climb ladders. °¨ Work from a high place. °· Do not make important decisions or sign legal documents until you are improved. °· If you vomit, drink water, juice, or soup when you can drink without vomiting. Make sure you have little or no nausea before eating solid foods. °· Only take over-the-counter or prescription medicines for pain, discomfort, or fever as directed by your health care provider. °· Make sure you and your family fully understand everything about the medicines given to you, including what side effects may occur. °· You should not drink alcohol, take sleeping pills, or take medicines that cause drowsiness for at least 24 hours. °· If you smoke, do not smoke without supervision. °· If you are feeling better, you may resume normal activities 24 hours after you were sedated. °· Keep all appointments with your health care provider. °SEEK MEDICAL CARE IF: °· Your skin is pale or bluish in color. °· You continue to feel nauseous or vomit. °· Your pain is getting worse and is not helped by medicine. °· You have bleeding or swelling. °· You are still sleepy or  feeling clumsy after 24 hours. °SEEK IMMEDIATE MEDICAL CARE IF: °· You develop a rash. °· You have difficulty breathing. °· You develop any type of allergic problem. °· You have a fever. °MAKE SURE YOU: °· Understand these instructions. °· Will watch your condition. °· Will get help right away if you are not doing well or get worse. °Document Released: 03/05/2013 Document Reviewed: 03/05/2013 °[employer]® Patient Information ©2015 [employer]. This information is not intended to replace advice given to you by your health care provider. Make sure you discuss any questions you have with your health care provider. °Transesophageal Echocardiogram °Transesophageal echocardiography (TEE) is a picture test of your heart using sound waves. The pictures taken can give very detailed pictures of your heart. This can help your doctor see if there are problems with your heart. TEE can check: °· If your heart has blood clots in it. °· How well your heart valves are working. °· If you have an infection on the inside of your heart. °· Some of the major arteries of your heart. °· If your heart valve is working after a repair. °· Your heart before a procedure that uses a shock to your heart to get the rhythm back to normal. °BEFORE THE PROCEDURE °· Do not eat or drink for 6 hours before the procedure or as told by your doctor. °· Make plans to have someone drive you home after the procedure. Do not drive yourself home. °· An IV tube will be put in your arm. °PROCEDURE °· You will be given a   medicine to help you relax (sedative). It will be given through the IV tube. °· A numbing medicine will be sprayed or gargled in the back of your throat to help numb it. °· The tip of the probe is placed into the back of your mouth. You will be asked to swallow. This helps to pass the probe into your esophagus. °· Once the tip of the probe is in the right place, your doctor can take pictures of your heart. °· You may feel pressure at the back of  your throat. °AFTER THE PROCEDURE °· You will be taken to a recovery area so the sedative can wear off. °· Your throat may be sore and scratchy. This will go away slowly over time. °· You will go home when you are fully awake and able to swallow liquids. °· You should have someone stay with you for the next 24 hours. °· Do not drive or operate machinery for the next 24 hours. °Document Released: 03/12/2009 Document Revised: 05/20/2013 Document Reviewed: 11/14/2012 °[employer]® Patient Information ©2015 [employer]. This information is not intended to replace advice given to you by your health care provider. Make sure you discuss any questions you have with your health care provider. ° °

## 2013-11-25 NOTE — H&P (View-Only) (Signed)
Patient ID: Cody Barnes, male   DOB: Jan 24, 1961, 53 y.o.   MRN: [id number]  [address] [place], [place]  [id number] Phone: [phone]) [phone] Fax:  [phone]) [phone]  Date:  11/04/2013   ID:  Cody Barnes, DOB Jan 24, 1961, MRN [id number]  PCP:  Sanda Lingerhomas Jones, MD      History of Present Illness: Cody MuseStuart Ost is a 53 y.o. male who has significant mitral regurgitation. Dr. Yetta BarreJones wants him to get evaluated for changes in his EKG. Reports SHOB with exertion x 2-3 months. Has to stop activity to catch his breath. Reports fatigue occasionally, especially at the end of the day.  Murmur:  Denies : Chest pain.  Dizziness.  Syncope Fatigue.  Leg edema.  Orthopnea (Sleeps on 2 pillows)  Palpitations.  PND  Syncope.     Wt Readings from Last 3 Encounters:  11/04/13 177 lb 3.2 oz (80.377 kg)  10/22/13 177 lb 6.4 oz (80.468 kg)  10/02/13 177 lb 12 oz (80.627 kg)     Past Medical History  Diagnosis Date  . Heart murmur   . Mitral valve prolapse     No current outpatient prescriptions on file.   No current facility-administered medications for this visit.    Allergies:   No Known Allergies  Social History:  The patient  reports that he has never smoked. He has never used smokeless tobacco. He reports that he drinks about 3 ounces of alcohol per week. He reports that he does not use illicit drugs.   Family History:  The patient's family history is negative for Cancer, Drug abuse, Early death, Heart disease, Hyperlipidemia, Hypertension, Kidney disease, Stroke, and Arthritis.   ROS:  Please see the history of present illness.  No nausea, vomiting.  No fevers, chills.  No focal weakness.  No dysuria.    All other systems reviewed and negative.   PHYSICAL EXAM: VS:  BP 104/64  Pulse 59  Ht 6' (1.829 m)  Wt 177 lb 3.2 oz (80.377 kg)  BMI 24.03 kg/m2 Well nourished, well developed, in no acute distress HEENT: normal Neck: no JVD, no carotid bruits Cardiac:  normal  S1, S2; RRR; 2/6 systolic murmur, 2/6 holodiastolic murmur Lungs:  clear to auscultation bilaterally, no wheezing, rhonchi or rales Abd: soft, nontender, no hepatomegaly Ext: no edema Skin: warm and dry Neuro:   no focal abnormalities noted  EKG:  NSR, PVCs, no significant ST segment changes     ASSESSMENT AND PLAN:  Mitral Regurgitation  Diagnostic Imaging:EKG Harward,Amy 07/09/2012 09:02:15 AM > Nezzie Manera,JAY 07/09/2012 09:28:36 AM > NSR, no ST segment changes, EC Echocardiogram (Ordered for 11/05/2012)  Plan to move up echocardiogram.. Concern for worsening mitral regurgitation given decreased exercise tolerance while walking upstairs..  COntinue SBE prophylaxis.   He is still trying higher level exercise. No problems with usual activities.  Continue to try to increase exercise duration. Currently most limited by knee pain.   Aortic insufficiency: Mild, noted on prior echocardiogram. No symptoms of CHF.  2. Others  Check BP at home or at drug store. Would like to see BP at least< 140/90, but closer to 120/80. Recheck in the office showed blood pressure of 126/78.  LDL 114 in 2011.  We'll plan for chest x-ray and BNP as well to evaluate for fluid overload. Unclear why he is having some shortness of breath. He does have allergic rhinitis. This season has been difficult on his allergies.     Signed, Fredric MareJay S. Bluford Sedler, MD,  FACC 11/04/2013 4:30 PM  

## 2013-11-26 ENCOUNTER — Encounter (HOSPITAL_COMMUNITY): Payer: Self-pay | Admitting: Cardiology

## 2013-11-26 ENCOUNTER — Telehealth: Payer: Self-pay | Admitting: Interventional Cardiology

## 2013-11-26 DIAGNOSIS — I059 Rheumatic mitral valve disease, unspecified: Secondary | ICD-10-CM

## 2013-11-26 DIAGNOSIS — I359 Nonrheumatic aortic valve disorder, unspecified: Secondary | ICD-10-CM

## 2013-11-26 NOTE — Telephone Encounter (Signed)
TEE not totally convincing that mitral regurgitation is severe, but no other reason for LV function decreasing a little from before.  Refer to Dr. Cornelius Moraswen and set up for cardiac MRI, on a day that Dr. Shirlee LatchMcLean can read to evaluate for mitral regurgitation and aortic regurgitation.  I already discussed case with Dr. Shirlee LatchMcLean

## 2013-11-26 NOTE — Telephone Encounter (Signed)
°  Patient had TEE done yesterday. He would like some feedback on the test. Please call and advise.

## 2013-11-26 NOTE — Telephone Encounter (Signed)
Dr. Eldridge DaceVaranasi, please review TEE that was done yesterday. Any futher w/u?

## 2013-11-27 ENCOUNTER — Encounter: Payer: Self-pay | Admitting: Internal Medicine

## 2013-11-27 ENCOUNTER — Telehealth: Payer: Self-pay | Admitting: Interventional Cardiology

## 2013-11-27 NOTE — Telephone Encounter (Addendum)
Pt notified. Cardiac MRI ordered and and referral sent. [facility].

## 2013-11-27 NOTE — Telephone Encounter (Signed)
OK to exercise.  If [facility] feels [facility], he should decrease activity.

## 2013-11-27 NOTE — Telephone Encounter (Signed)
New message    Patient calling back with additional questions .

## 2013-11-27 NOTE — Telephone Encounter (Signed)
Pt.notified

## 2013-11-27 NOTE — Telephone Encounter (Signed)
Spoke with pt and he would like to know if it is okay for him to work out, which includes includes: walking, running and boot camp 2 hrs a week.

## 2013-12-03 ENCOUNTER — Encounter: Payer: Self-pay | Admitting: Thoracic Surgery (Cardiothoracic Vascular Surgery)

## 2013-12-03 DIAGNOSIS — I351 Nonrheumatic aortic (valve) insufficiency: Secondary | ICD-10-CM | POA: Insufficient documentation

## 2013-12-03 DIAGNOSIS — I340 Nonrheumatic mitral (valve) insufficiency: Secondary | ICD-10-CM | POA: Insufficient documentation

## 2013-12-04 ENCOUNTER — Telehealth: Payer: Self-pay | Admitting: Interventional Cardiology

## 2013-12-04 ENCOUNTER — Encounter: Payer: Self-pay | Admitting: Thoracic Surgery (Cardiothoracic Vascular Surgery)

## 2013-12-04 ENCOUNTER — Encounter (INDEPENDENT_AMBULATORY_CARE_PROVIDER_SITE_OTHER): Payer: 59 | Admitting: Thoracic Surgery (Cardiothoracic Vascular Surgery)

## 2013-12-04 DIAGNOSIS — Z79899 Other long term (current) drug therapy: Secondary | ICD-10-CM

## 2013-12-04 NOTE — Telephone Encounter (Signed)
New message     Talk to a nurse regarding an MRI that was supposed to have been scheduled

## 2013-12-04 NOTE — Telephone Encounter (Signed)
Pts wife notified about Cardiac MRI appt. Labs ordered and pt will have drawn at Dr. Barry Dieneswens office tomorrow.

## 2013-12-04 NOTE — Progress Notes (Signed)
This encounter was created in error - please disregard.

## 2013-12-05 ENCOUNTER — Institutional Professional Consult (permissible substitution) (INDEPENDENT_AMBULATORY_CARE_PROVIDER_SITE_OTHER): Payer: 59 | Admitting: Thoracic Surgery (Cardiothoracic Vascular Surgery)

## 2013-12-05 ENCOUNTER — Encounter: Payer: Self-pay | Admitting: Thoracic Surgery (Cardiothoracic Vascular Surgery)

## 2013-12-05 VITALS — BP 132/72 | HR 53 | Resp 16 | Ht 72.0 in | Wt 175.0 lb

## 2013-12-05 DIAGNOSIS — I351 Nonrheumatic aortic (valve) insufficiency: Secondary | ICD-10-CM

## 2013-12-05 DIAGNOSIS — I059 Rheumatic mitral valve disease, unspecified: Secondary | ICD-10-CM

## 2013-12-05 DIAGNOSIS — I340 Nonrheumatic mitral (valve) insufficiency: Secondary | ICD-10-CM

## 2013-12-05 DIAGNOSIS — I359 Nonrheumatic aortic valve disorder, unspecified: Secondary | ICD-10-CM

## 2013-12-05 NOTE — Progress Notes (Signed)
     [address]       [place] [address]             [phone]     CARDIOTHORACIC SURGERY CONSULTATION REPORT  Referring Provider is Varanasi, Jayadeep S, MD PCP is Thomas Jones, MD  Chief Complaint  Patient presents with  . Shortness of Breath    on exertion...MITRAL VALVE REGURGIATION/AORTIC VALVE REGURGITATION...TEE    HPI:  Patient is a 52-year-old otherwise healthy white male with long-standing history of heart murmur who was diagnosed with mitral valve prolapse approximately 8 years ago. He has been followed recently by Dr. Varanasi with serial echocardiograms. Over the past 3 months the patient has developed symptoms of exertional shortness of breath. He was seen by his primary care physician who noted a change in the patient's heart murmur and EKG. Early followup with his cardiologist was recommended.  Dr. Varanasi saw the patient in the office on 11/04/2013, and subsequent transthoracic echocardiogram demonstrated the presence of mild aortic insufficiency and mild mitral regurgitation, although both jets were notably eccentric.  To further investigate a transesophageal echocardiogram was performed by Dr. Turner 11/25/2013. This revealed moderate to severe aortic insufficiency with moderate to severe mitral regurgitation and mild left ventricular chamber enlargement. The patient has been referred for cardiac surgical consultation.  The patient is an accountant and lives with his wife hearing Cody Barnes and works full-time for [employer]. He remains active physically and exercises on a regular basis. He describes a 3 month history of mild exertional shortness of breath. Symptoms only occur with relatively strenuous exertion, such as going up a flight of stairs or jogging. The patient does not get short of breath with mild activity and his breathing has not limited his physical activities significantly. He denies any history of resting shortness of breath,  PND, orthopnea, or lower extremity edema. He has never had any chest pain or chest discomfort.  Past Medical History  Diagnosis Date  . Heart murmur   . Mitral valve prolapse   . Mitral regurgitation   . Aortic insufficiency     Past Surgical History  Procedure Laterality Date  . Hip fracture surgery      left  . Inguinal hernia repair      as infant  . Tee without cardioversion N/A 11/25/2013    Procedure: TRANSESOPHAGEAL ECHOCARDIOGRAM (TEE);  Surgeon: Traci R Turner, MD;  Location: [facility];  Service: Cardiovascular;  Laterality: N/A;    Family History  Problem Relation Age of Onset  . Drug abuse Neg Hx   . Early death Neg Hx   . Heart disease Neg Hx   . Hyperlipidemia Neg Hx   . Hypertension Neg Hx   . Kidney disease Neg Hx   . Stroke Neg Hx   . Arthritis Neg Hx   . Cancer Mother     History   Social History  . Marital Status: Single    Spouse Name: N/A    Number of Children: N/A  . Years of Education: N/A   Occupational History  . Not on file.   Social History Main Topics  . Smoking status: Never Smoker   . Smokeless tobacco: Never Used  . Alcohol Use: 3.0 oz/week    5 Cans of beer per week  . Drug Use: No  . Sexual Activity: Yes   Other Topics Concern  . Not on file   Social History Narrative  . No narrative on file      Current Outpatient Prescriptions  Medication Sig Dispense Refill  . acetaminophen (TYLENOL) 325 MG tablet Take 325 mg by mouth every 6 (six) hours as needed for headache.      . calcium carbonate (OS-CAL) 1250 MG chewable tablet Chew 1 tablet by mouth daily as needed for heartburn.       No current facility-administered medications for this visit.    No Known Allergies    Review of Systems:   General:  normal appetite, normal energy, no weight gain, no weight loss, no fever  Cardiac:  no chest pain with exertion, no chest pain at rest, + SOB with exertion, no resting SOB, no PND, no orthopnea, no palpitations, no  arrhythmia, no atrial fibrillation, no LE edema, no dizzy spells, no syncope  Respiratory:  + exertional shortness of breath, no home oxygen, no productive cough, no dry cough, no bronchitis, no wheezing, no hemoptysis, no asthma, no pain with inspiration or cough, no sleep apnea, no CPAP at night  GI:   no difficulty swallowing, no reflux, no frequent heartburn, no hiatal hernia, no abdominal pain, no constipation, no diarrhea, no hematochezia, no hematemesis, no melena  GU:   no dysuria,  no frequency, no urinary tract infection, no hematuria, no enlarged prostate, no kidney stones, no kidney disease  Vascular:  no pain suggestive of claudication, no pain in feet, no leg cramps, no varicose veins, no DVT, no non-healing foot ulcer  Neuro:   no stroke, no TIA's, no seizures, no headaches, no temporary blindness one eye,  no slurred speech, no peripheral neuropathy, no chronic pain, no instability of gait, no memory/cognitive dysfunction  Musculoskeletal: no arthritis, no joint swelling, no myalgias, no difficulty walking, normal mobility   Skin:   no rash, no itching, no skin infections, no pressure sores or ulcerations  Psych:   no anxiety, no depression, no nervousness, no unusual recent stress  Eyes:   no blurry vision, no floaters, no recent vision changes, + wears glasses or contacts  ENT:   no hearing loss, no loose or painful teeth, no dentures, last saw dentist within 4 months  Hematologic:  no easy bruising, no abnormal bleeding, no clotting disorder, no frequent epistaxis  Endocrine:  no diabetes, does not check CBG's at home     Physical Exam:   BP 132/72  Pulse 53  Resp 16  Ht 6' (1.829 m)  Wt 175 lb (79.379 kg)  BMI 23.73 kg/m2  SpO2 97%  General:  Thin,  well-appearing  HEENT:  Unremarkable   Neck:   no JVD, no bruits, no adenopathy   Chest:   clear to auscultation, symmetrical breath sounds, no wheezes, no rhonchi   CV:   RRR, grade III/VI diastolic murmur best at LSB,  III/VI systolic murmur at apex  Abdomen:  soft, non-tender, no masses   Extremities:  warm, well-perfused, pulses palpable, no LE edema  Rectal/GU  Deferred  Neuro:   Grossly non-focal and symmetrical throughout  Skin:   Clean and dry, no rashes, no breakdown   Diagnostic Tests:  Transthoracic Echocardiography  Patient: Barnes, Cody MR #: [id number] Study Date: 11/13/2013 Gender: M Age: 52 Height: 182.9 cm Weight: 80.3 kg BSA: 2.02 m^2 Pt. Status: Room:  ATTENDING Thomas Kelly, M.D. REFERRING Jones, Thomas L ORDERING Varanasi, Jayadeep S REFERRING Varanasi, Jayadeep S PERFORMING [facility], Outpatient SONOGRAPHER Bethany McMahill, RDCS  cc:  ------------------------------------------------------------------- LV EF: 50% - 55%  ------------------------------------------------------------------- Indications: 424.0 Mitral valve disease.  ------------------------------------------------------------------- History: PMH: Mitral Valve Prolapse,   Edema, Palpitations. Syncope, dyspnea, and murmur.  ------------------------------------------------------------------- Study Conclusions  - Left ventricle: The cavity size was mildly dilated. There was mild focal basal hypertrophy of the septum. Systolic function was normal. The estimated ejection fraction was in the range of 50% to 55%. Wall motion was normal; there were no regional wall motion abnormalities. Doppler parameters are consistent with abnormal left ventricular relaxation (grade 1 diastolic dysfunction). - Aortic valve: There was mild regurgitation. - Aortic root: The aortic root was mildly dilated. - Mitral valve: Mild prolapse, involving the posterior leaflet. There was mild regurgitation directed eccentrically and anteriorly. - Left atrium: The atrium was mildly dilated.  Impressions:  - Normal LV function; prolapse of posterior MV leafet with mild anteriorly directed MR; mild AI.  Transthoracic  echocardiography. M-mode, complete 2D, spectral Doppler, and color Doppler. Birthdate: Patient birthdate: 10/09/1960. Age: Patient is 52 yr old. Sex: Gender: male. Height: Height: 182.9 cm. Height: 72 in. Weight: Weight: 80.3 kg. Weight: 176.6 lb. Body mass index: BMI: 24 kg/m^2. Body surface area: BSA: 2.02 m^2. Blood pressure: 104/64 Patient status: Outpatient. Study date: Study date: 11/13/2013. Study time: 08:10 AM. Location: Echo laboratory.  -------------------------------------------------------------------  ------------------------------------------------------------------- Left ventricle: The cavity size was mildly dilated. There was mild focal basal hypertrophy of the septum. Systolic function was normal. The estimated ejection fraction was in the range of 50% to 55%. Wall motion was normal; there were no regional wall motion abnormalities. Doppler parameters are consistent with abnormal left ventricular relaxation (grade 1 diastolic dysfunction).  ------------------------------------------------------------------- Aortic valve: Trileaflet; mildly calcified leaflets. Mobility was not restricted. Doppler: Transvalvular velocity was within the normal range. There was no stenosis. There was mild regurgitation.  ------------------------------------------------------------------- Aorta: Aortic root: The aortic root was mildly dilated.  ------------------------------------------------------------------- Mitral valve: Mildly thickened leaflets . Mobility was not restricted. Mild prolapse, involving the posterior leaflet. Doppler: Transvalvular velocity was within the normal range. There was no evidence for stenosis. There was mild regurgitation directed eccentrically and anteriorly.  ------------------------------------------------------------------- Left atrium: LA Volume/BSA= 34.2 ml/m2. The atrium was  mildly dilated.  ------------------------------------------------------------------- Right ventricle: The cavity size was normal. Systolic function was normal.  ------------------------------------------------------------------- Pulmonic valve: Doppler: Transvalvular velocity was within the normal range. There was no evidence for stenosis.  ------------------------------------------------------------------- Tricuspid valve: Structurally normal valve. Doppler: Transvalvular velocity was within the normal range. There was trivial regurgitation.  ------------------------------------------------------------------- Pulmonary artery: Systolic pressure was within the normal range.  ------------------------------------------------------------------- Right atrium: The atrium was normal in size.  ------------------------------------------------------------------- Pericardium: There was no pericardial effusion.  ------------------------------------------------------------------- Systemic veins: Inferior vena cava: The vessel was normal in size. The respirophasic diameter changes were in the normal range (= 50%), consistent with normal central venous pressure. Diameter: 22 mm.  ------------------------------------------------------------------- Prepared and Electronically Authenticated by  Brian Crenshaw 2015-06-18T10:03:41  ------------------------------------------------------------------- Measurements  IVC Value Reference ID 22 mm ---------  Left ventricle Value Reference LV ID, ED, PLAX chordal (H) 56.4 mm 43 - 52 LV ID, ES, PLAX chordal (H) 50.6 mm 23 - 38 LV fx shortening, PLAX chordal (L) 10 % >=29 LV PW thickness, ED 10.7 mm --------- IVS/LV PW ratio, ED (N) 0.94 <=1.3 LV e&', lateral 12.3 cm/s --------- LV E/e&', lateral 5.3 --------- LV e&', medial 8.01 cm/s --------- LV E/e&', medial 8.14 --------- LV e&', average 10.16 cm/s --------- LV E/e&', average 6.42  ---------  Ventricular septum Value Reference IVS thickness, ED 10.1 mm ---------  Aortic valve Value Reference Aortic valve peak velocity, S 148 cm/s --------- Aortic regurg pressure half-time 641 ms ---------    Aorta Value Reference Aortic root ID, ED 38 mm ---------  Left atrium Value Reference LA ID, A-P, ES 32 mm --------- LA ID/bsa, A-P (N) 1.58 cm/m^2 <=2.2  Mitral valve Value Reference Mitral E-wave peak velocity 65.2 cm/s --------- Mitral A-wave peak velocity 69.1 cm/s --------- Mitral deceleration time (H) 352 ms 150 - 230 Mitral E/A ratio, peak 0.9 ---------  Tricuspid valve Value Reference Tricuspid regurg peak velocity 214 cm/s --------- Tricuspid peak RV-RA gradient 18 mm Hg --------- Tricuspid maximal regurg velocity, 214 cm/s --------- PISA  Right ventricle Value Reference RV s&', lateral, S 12.8 cm/s ---------  Legend: (L) and (H) mark values outside specified reference range.  (N) marks values inside specified reference range.    Transesophageal Echocardiography  Patient: Barnes, Cody MR #: [id number] Study Date: 11/25/2013 Gender: M Age: 52 Height: 182.9 cm Weight: 79.5 kg BSA: 2.01 m^2 Pt. Status: Room:  ADMITTING Traci Turner, MD ATTENDING Traci Turner, MD ORDERING Traci Turner, MD PERFORMING Traci Turner, MD REFERRING Traci Turner, MD SONOGRAPHER Jimmy Reel, RDCS  cc:  ------------------------------------------------------------------- LV EF: 50% - 55%  ------------------------------------------------------------------- Indications: Mitral valve prolapse 424.0.  ------------------------------------------------------------------- History: PMH: Mitral regurgitation.  ------------------------------------------------------------------- Study Conclusions  - Left ventricle: The cavity size was mildly dilated. Systolic function was normal. The estimated ejection fraction was in the range of 50% to 55%. Wall motion was normal;  there were no regional wall motion abnormalities. - Aortic valve: In the 60 degree view the AR appears moderate but in the 90 degree to 120 degree views the AR appears moderate to severe. There was moderate regurgitation directed centrally in the LVOT and eccentrically in the LVOT. - Aorta: The aorta was mildly dilated. - Mitral valve: There is at least moderate and probably moderate to very eccentric MR directed at the anterior left atrial wall and wraps around the LA. There was no obvious flow reversal in the pulmonary vein. There are several jets of MR noted. Moderate, holosystolicprolapse, involving the posterior leaflet. There was moderate to severe regurgitation. - Left atrium: The atrium was mildly dilated. No evidence of thrombus in the atrial cavity or appendage. - Right atrium: No evidence of thrombus in the atrial cavity or appendage. - Atrial septum: No defect or patent foramen ovale was identified.  Diagnostic transesophageal echocardiography. 2D and color Doppler. Birthdate: Patient birthdate: 05/07/1961. Age: Patient is 52 yr old. Sex: Gender: male. Height: Height: 182.9 cm. Height: 72 in. Weight: Weight: 79.5 kg. Weight: 175 lb. Body mass index: BMI: 23.8 kg/m^2. Body surface area: BSA: 2.01 m^2. Blood pressure: 153/67 Patient status: Outpatient. Study date: Study date: 11/25/2013. Study time: 09:05 AM. Location: Endoscopy.  -------------------------------------------------------------------  ------------------------------------------------------------------- Left ventricle: The cavity size was mildly dilated. Systolic function was normal. The estimated ejection fraction was in the range of 50% to 55%. Wall motion was normal; there were no regional wall motion abnormalities.  ------------------------------------------------------------------- Aortic valve: In the 60 degree view the AR appears moderate but in the 90 degree to 120 degree views the AR appears  moderate to severe. Structurally normal valve. Trileaflet; normal thickness leaflets. Cusp separation was normal. Doppler: There was moderate regurgitation directed centrally in the LVOT and eccentrically in the LVOT.  ------------------------------------------------------------------- Aorta: The aorta was mildly dilated. There was no atheroma. There was no evidence for dissection. Aortic root: The aortic root was not dilated. Ascending aorta: The ascending aorta was normal in size. Aortic arch: The aortic arch was normal in size. Descending aorta: The descending aorta was normal in size.  -------------------------------------------------------------------   Mitral valve: There is at least moderate and probably moderate to very eccentric MR directed at the anterior left atrial wall and wraps around the LA. There was no obvious flow reversal in the pulmonary vein. There are several jets of MR noted. Leaflet separation was normal. Moderate, holosystolicprolapse, involving the posterior leaflet. Doppler: There was moderate to severe regurgitation.  ------------------------------------------------------------------- Left atrium: The atrium was mildly dilated. No evidence of thrombus in the atrial cavity or appendage. The appendage was morphologically a left appendage, multilobulated, and of normal size. Emptying velocity was normal.  ------------------------------------------------------------------- Atrial septum: No defect or patent foramen ovale was identified.  ------------------------------------------------------------------- Right ventricle: The cavity size was normal. Wall thickness was normal. Systolic function was normal.  ------------------------------------------------------------------- Pulmonic valve: Structurally normal valve. Doppler: There was trivial regurgitation.  ------------------------------------------------------------------- Tricuspid valve: Structurally  normal valve. Leaflet separation was normal. Doppler: There was mild regurgitation.  ------------------------------------------------------------------- Pulmonary artery: The main pulmonary artery was normal-sized.  ------------------------------------------------------------------- Right atrium: The atrium was normal in size. No evidence of thrombus in the atrial cavity or appendage. The appendage was morphologically a right appendage.  ------------------------------------------------------------------- Pericardium: There was no pericardial effusion.  ------------------------------------------------------------------- Post procedure conclusions Ascending Aorta:  - The aorta was mildly dilated.  ------------------------------------------------------------------- Prepared and Electronically Authenticated by  Traci Turner, MD 2015-07-02T03:38:30    Impression:  The patient has stage D severe symptomatic aortic insufficiency and mitral regurgitation.  He has long history of mitral valve prolapse involving the posterior leaflet. He does not have a flail segment, but the severity of mitral regurgitation is at least moderate to severe. The patient's aortic valve is tricuspid and dilated. There is eccentric jet of aortic insufficiency that is at least moderate to severe. The patient describes recent onset of symptoms of exertional shortness of breath. Echocardiogram demonstrates mild left ventricular chamber enlargement. I feel he would best be treated with elective aortic valve repair or replacement and mitral valve repair. His ascending aorta and aortic root should be imaged to further investigate the possibility of significant aneurysmal enlargement of the aortic root. The patient will additionally need diagnostic cardiac catheterization.   Plan:  I discussed matters at length with the patient and his wife in the office today. The rationale for surgical intervention has been discussed  in contrast with continued medical therapy. All of their questions been addressed. We plan to discuss with Dr. Varanasi the possibility of scheduling elective diagnostic catheterization in the near future. I favor cardiac gated CT angiography to further evaluate the aortic root and ascending aorta. Contrast enhanced MRI could be performed as an alternative. The patient will return in 3 weeks after these diagnostic tests at been completed.   I spent in excess of 90 minutes during the conduct of this office consultation and >50% of this time involved direct face-to-face encounter with the patient for counseling and/or coordination of their care.          Level 3 Office Consult = 40 minutes         Level 4 Office Consult = 60 minutes         Level 5 Office Consult = 80 minutes  Kerigan Narvaez H. Iliyah Bui, MD 12/05/2013 5:47 PM   

## 2013-12-08 ENCOUNTER — Encounter: Payer: Self-pay | Admitting: Cardiology

## 2013-12-08 ENCOUNTER — Telehealth: Payer: Self-pay | Admitting: Interventional Cardiology

## 2013-12-08 DIAGNOSIS — I351 Nonrheumatic aortic (valve) insufficiency: Secondary | ICD-10-CM

## 2013-12-08 DIAGNOSIS — I059 Rheumatic mitral valve disease, unspecified: Secondary | ICD-10-CM

## 2013-12-08 NOTE — Telephone Encounter (Signed)
Cath set up for 12/12/13, pt notified and instructions given. Pt will come for labwork tomorrow.

## 2013-12-08 NOTE — Telephone Encounter (Signed)
New message     Talk to Amy regarding a surgery he is having with Dr Cornelius Moraswen.

## 2013-12-08 NOTE — Telephone Encounter (Signed)
Spoke with pt and he will need heart cath per Dr. Cornelius Moraswen and Dr. Eldridge DaceVaranasi.

## 2013-12-09 ENCOUNTER — Other Ambulatory Visit: Payer: 59

## 2013-12-10 ENCOUNTER — Telehealth: Payer: Self-pay | Admitting: Interventional Cardiology

## 2013-12-10 ENCOUNTER — Other Ambulatory Visit (INDEPENDENT_AMBULATORY_CARE_PROVIDER_SITE_OTHER): Payer: 59

## 2013-12-10 DIAGNOSIS — I059 Rheumatic mitral valve disease, unspecified: Secondary | ICD-10-CM

## 2013-12-10 DIAGNOSIS — I359 Nonrheumatic aortic valve disorder, unspecified: Secondary | ICD-10-CM

## 2013-12-10 DIAGNOSIS — Z79899 Other long term (current) drug therapy: Secondary | ICD-10-CM

## 2013-12-10 DIAGNOSIS — I351 Nonrheumatic aortic (valve) insufficiency: Secondary | ICD-10-CM

## 2013-12-10 LAB — CBC WITH DIFFERENTIAL/PLATELET
Basophils Absolute: 0 10*3/uL (ref 0.0–0.1)
Basophils Relative: 0.4 % (ref 0.0–3.0)
Eosinophils Absolute: 0.3 10*3/uL (ref 0.0–0.7)
Eosinophils Relative: 3.9 % (ref 0.0–5.0)
HCT: 44.8 % (ref 39.0–52.0)
Hemoglobin: 14.8 g/dL (ref 13.0–17.0)
Lymphocytes Relative: 26.3 % (ref 12.0–46.0)
Lymphs Abs: 1.8 10*3/uL (ref 0.7–4.0)
MCHC: 33.1 g/dL (ref 30.0–36.0)
MCV: 91.1 fl (ref 78.0–100.0)
Monocytes Absolute: 0.6 10*3/uL (ref 0.1–1.0)
Monocytes Relative: 8.7 % (ref 3.0–12.0)
Neutro Abs: 4.1 10*3/uL (ref 1.4–7.7)
Neutrophils Relative %: 60.7 % (ref 43.0–77.0)
Platelets: 213 10*3/uL (ref 150.0–400.0)
RBC: 4.92 Mil/uL (ref 4.22–5.81)
RDW: 13.1 % (ref 11.5–15.5)
WBC: 6.7 10*3/uL (ref 4.0–10.5)

## 2013-12-10 LAB — BASIC METABOLIC PANEL
BUN: 18 mg/dL (ref 6–23)
CO2: 26 mEq/L (ref 19–32)
Calcium: 9.2 mg/dL (ref 8.4–10.5)
Chloride: 104 mEq/L (ref 96–112)
Creatinine, Ser: 1.2 mg/dL (ref 0.4–1.5)
GFR: 69.16 mL/min (ref >60.00)
Glucose, Bld: 121 mg/dL — ABNORMAL HIGH (ref 70–99)
Potassium: 4.1 mEq/L (ref 3.5–5.1)
Sodium: 137 mEq/L (ref 135–145)

## 2013-12-10 LAB — PROTIME-INR
INR: 1 ratio (ref 0.8–1.0)
Prothrombin Time: 10.7 s (ref 9.6–13.1)

## 2013-12-10 LAB — BUN: BUN: 18 mg/dL (ref 6–23)

## 2013-12-10 LAB — CREATININE, SERUM: Creatinine, Ser: 1.2 mg/dL (ref 0.4–1.5)

## 2013-12-10 NOTE — Telephone Encounter (Signed)
I will check with Dr. Eldridge DaceVaranasi to see if pt still needs MRI.

## 2013-12-10 NOTE — Telephone Encounter (Signed)
New Prob   Requesting to change date of MRI. Please call.

## 2013-12-10 NOTE — Telephone Encounter (Signed)
Spoke to patient. WIll decide on MRI after the cath

## 2013-12-11 ENCOUNTER — Encounter: Payer: Self-pay | Admitting: Interventional Cardiology

## 2013-12-12 ENCOUNTER — Encounter (HOSPITAL_COMMUNITY)
Admission: RE | Disposition: A | Discharge status: Home / Self Care | Payer: Self-pay | Source: Ambulatory Visit | Attending: Interventional Cardiology

## 2013-12-12 ENCOUNTER — Ambulatory Visit (HOSPITAL_COMMUNITY)
Admission: RE | Admit: 2013-12-12 | Discharge: 2013-12-12 | Disposition: A | Discharge status: Home / Self Care | Payer: 59 | Source: Ambulatory Visit | Attending: Interventional Cardiology | Admitting: Interventional Cardiology

## 2013-12-12 DIAGNOSIS — I340 Nonrheumatic mitral (valve) insufficiency: Secondary | ICD-10-CM

## 2013-12-12 DIAGNOSIS — I351 Nonrheumatic aortic (valve) insufficiency: Secondary | ICD-10-CM

## 2013-12-12 DIAGNOSIS — R0609 Other forms of dyspnea: Secondary | ICD-10-CM

## 2013-12-12 DIAGNOSIS — I359 Nonrheumatic aortic valve disorder, unspecified: Secondary | ICD-10-CM

## 2013-12-12 DIAGNOSIS — I059 Rheumatic mitral valve disease, unspecified: Secondary | ICD-10-CM

## 2013-12-12 DIAGNOSIS — R0989 Other specified symptoms and signs involving the circulatory and respiratory systems: Secondary | ICD-10-CM

## 2013-12-12 DIAGNOSIS — I080 Rheumatic disorders of both mitral and aortic valves: Secondary | ICD-10-CM | POA: Insufficient documentation

## 2013-12-12 DIAGNOSIS — R0600 Dyspnea, unspecified: Secondary | ICD-10-CM

## 2013-12-12 HISTORY — PX: LEFT AND RIGHT HEART CATHETERIZATION WITH CORONARY ANGIOGRAM: SHX5449

## 2013-12-12 LAB — POCT I-STAT 3, VENOUS BLOOD GAS (G3P V)
Acid-Base Excess: 1 mmol/L (ref 0.0–2.0)
Bicarbonate: 26.2 mEq/L — ABNORMAL HIGH (ref 20.0–24.0)
Bicarbonate: 26.9 mEq/L — ABNORMAL HIGH (ref 20.0–24.0)
O2 Saturation: 72 %
O2 Saturation: 75 %
TCO2: 28 mmol/L (ref 0–100)
TCO2: 28 mmol/L (ref 0–100)
pCO2, Ven: 45.9 mmHg (ref 45.0–50.0)
pCO2, Ven: 47.4 mmHg (ref 45.0–50.0)
pH, Ven: 7.363 — ABNORMAL HIGH (ref 7.250–7.300)
pH, Ven: 7.364 — ABNORMAL HIGH (ref 7.250–7.300)
pO2, Ven: 40 mmHg (ref 30.0–45.0)
pO2, Ven: 42 mmHg (ref 30.0–45.0)

## 2013-12-12 LAB — POCT I-STAT 3, ART BLOOD GAS (G3+)
Bicarbonate: 25.2 mEq/L — ABNORMAL HIGH (ref 20.0–24.0)
O2 Saturation: 96 %
TCO2: 26 mmol/L (ref 0–100)
pCO2 arterial: 41.6 mmHg (ref 35.0–45.0)
pH, Arterial: 7.39 (ref 7.350–7.450)
pO2, Arterial: 80 mmHg (ref 80.0–100.0)

## 2013-12-12 SURGERY — LEFT AND RIGHT HEART CATHETERIZATION WITH CORONARY ANGIOGRAM
Anesthesia: LOCAL

## 2013-12-12 MED ORDER — SODIUM CHLORIDE 0.9 % IV SOLN: 250.0000 mL | INTRAVENOUS | Status: DC | PRN

## 2013-12-12 MED ORDER — HEPARIN SODIUM (PORCINE) 1000 UNIT/ML IJ SOLN
INTRAMUSCULAR | Status: AC
  Filled 2013-12-12: qty 1

## 2013-12-12 MED ORDER — SODIUM CHLORIDE 0.9 % IJ SOLN: 3.0000 mL | INTRAMUSCULAR | Status: DC | PRN

## 2013-12-12 MED ORDER — LIDOCAINE HCL (PF) 1 % IJ SOLN
INTRAMUSCULAR | Status: AC
  Filled 2013-12-12: qty 30

## 2013-12-12 MED ORDER — FENTANYL CITRATE 0.05 MG/ML IJ SOLN
INTRAMUSCULAR | Status: AC
  Filled 2013-12-12: qty 2

## 2013-12-12 MED ORDER — SODIUM CHLORIDE 0.9 % IV SOLN: 1.0000 mL/kg/h | INTRAVENOUS | Status: DC

## 2013-12-12 MED ORDER — SODIUM CHLORIDE 0.9 % IJ SOLN: 3.0000 mL | Freq: Two times a day (BID) | INTRAMUSCULAR | Status: DC

## 2013-12-12 MED ORDER — MIDAZOLAM HCL 2 MG/2ML IJ SOLN
INTRAMUSCULAR | Status: AC
  Filled 2013-12-12: qty 2

## 2013-12-12 MED ORDER — SODIUM CHLORIDE 0.9 % IV SOLN
INTRAVENOUS | Status: DC
  Administered 2013-12-12: 09:00:00 via INTRAVENOUS

## 2013-12-12 MED ORDER — ASPIRIN 81 MG PO CHEW
CHEWABLE_TABLET | ORAL | Status: AC
  Filled 2013-12-12: qty 1

## 2013-12-12 MED ORDER — HEPARIN (PORCINE) IN NACL 2-0.9 UNIT/ML-% IJ SOLN
INTRAMUSCULAR | Status: AC
  Filled 2013-12-12: qty 1000

## 2013-12-12 MED ORDER — NITROGLYCERIN 1 MG/10 ML FOR IR/CATH LAB
INTRA_ARTERIAL | Status: AC
  Filled 2013-12-12: qty 10

## 2013-12-12 MED ORDER — ASPIRIN 81 MG PO CHEW
81.0000 mg | CHEWABLE_TABLET | ORAL | Status: AC
  Administered 2013-12-12: 81 mg via ORAL

## 2013-12-12 MED ORDER — VERAPAMIL HCL 2.5 MG/ML IV SOLN
INTRAVENOUS | Status: AC
  Filled 2013-12-12: qty 2

## 2013-12-12 NOTE — CV Procedure (Signed)
       PROCEDURE:  Right heart cath; Left heart catheterization with selective coronary angiography, left ventriculogram.  INDICATIONS:  Preoperative evaluation for valve surgery  The risks, benefits, and details of the procedure were explained to the patient.  The patient verbalized understanding and wanted to proceed.  Informed written consent was obtained.  PROCEDURE TECHNIQUE:  After lidocaine, an antecubital IV was switched out for a 5 French slender sheath over a wire. A 5 French Swan-Ganz catheter was advanced to the pulmonary artery under fluoroscopic guidance. Hemodynamics were measured. Arterial saturation was measured. After Xylocaine anesthesia a 35F slender sheath was placed in the right radial artery with a single anterior needle wall stick.   IV heparin was given. Right coronary angiography was done using a Judkins R4 guide catheter.  Left coronary angiography was done using a Judkins L3.5 guide catheter.  Left ventriculography was done using a pigtail catheter.  A TR band was used for hemostasis. Of note, there is some spasm at the proximal radial artery which required intra-arterial nitroglycerin. A hand injection of contrast was performed through the sheath visualizing the vasospasm.   CONTRAST:  Total of 70 cc.  COMPLICATIONS:  None.    HEMODYNAMICS:  Aortic pressure was 132/66; LV pressure was 133/10; LVEDP 14.  There was no gradient between the left ventricle and aorta.  Right atrial pressure 5/10; Z. ratio pressure 4 mmHg; RV pressure twenty-seven over two, RVEDP 6 mmHg; pulmonary artery pressure 27/10, mean pulmonary artery pressure 17 mmHg; pulmonary capillary wedge pressure 17/14; mean pulmonary capillary wedge pressure 11 mmHg. Aortic saturation 96%. Pulmonary artery saturation 74%.  ANGIOGRAPHIC DATA:   The left main coronary artery is widely patent.  The left anterior descending artery is a large vessel which wraps around the apex. The vessel appears angiographically  normal. There are several large diagonals which are also widely patent.  The left circumflex artery is a large vessel which appears widely patent. There is a large ramus vessel which appears widely patent.  The right coronary artery is a large vessel with some mild, proximal ectasia. There is no significant atherosclerosis. The posterior lateral artery is large and has several branches. The posterior descending artery is small but patent.  LEFT VENTRICULOGRAM:  Left ventricular angiogram was done in the 30 RAO projection and revealed normal left ventricular wall motion and systolic function with an estimated ejection fraction of 55%.  LVEDP was 14 mmHg.  1+ mitral regurgitation.  IMPRESSIONS:  1. Normal left main coronary artery. 2. Normal left anterior descending artery and its branches. 3. Normal left circumflex artery and its branches. 4. Normal right coronary artery. 5. Normal left ventricular systolic function.  LVEDP 14 mmHg.  Ejection fraction 55%. 6.   Normal PA pressures.  CO 6 L/min; Cardiac Index 3.  RECOMMENDATION:  Continue plan for valve repair surgery.

## 2013-12-12 NOTE — Progress Notes (Signed)
Site area: right radial vein  Site Prior to Removal:  Level 0 Pressure Applied For  25 MINUTES   Manual:   yes Patient Status During Pull:  Denies pain, vitals stable Post Pull  Site:  Level 0 for both brachial vein and radial artery  Post Pull Instructions Given:  yes Post Pull Pulses Present: radial 2+ Dressing Applied: Tegaderm applied to right brachial site   Bedrest begins// arterial TR band still in place no air has been removed yet Comments: unable to pull blood back prior to pulling brachial vein sheath, pt  tolerated well

## 2013-12-12 NOTE — Discharge Instructions (Signed)
Radial Site Care Refer to this sheet in the next few weeks. These instructions provide you with information on caring for yourself after your procedure. Your caregiver may also give you more specific instructions. Your treatment has been planned according to current medical practices, but problems sometimes occur. Call your caregiver if you have any problems or questions after your procedure. HOME CARE INSTRUCTIONS  You may shower the day after the procedure.Remove the bandage (dressing) and gently wash the site with plain soap and water.Gently pat the site dry.  Do not apply powder or lotion to the site.  Do not submerge the affected site in water for 3 to 5 days.  Inspect the site at least twice daily.  Do not flex or bend the affected arm for 24 hours.  No lifting over 5 pounds (2.3 kg) for 5 days after your procedure.  Do not drive home if you are discharged the same day of the procedure. Have someone else drive you.  You may drive 24 hours after the procedure unless otherwise instructed by your caregiver.  Do not operate machinery or power tools for 24 hours.  A responsible adult should be with you for the first 24 hours after you arrive home. What to expect:  Any bruising will usually fade within 1 to 2 weeks.  Blood that collects in the tissue (hematoma) may be painful to the touch. It should usually decrease in size and tenderness within 1 to 2 weeks. SEEK IMMEDIATE MEDICAL CARE IF:  You have unusual pain at the radial site.  You have redness, warmth, swelling, or pain at the radial site.  You have drainage (other than a small amount of blood on the dressing).  You have chills.  You have a fever or persistent symptoms for more than 72 hours.  You have a fever and your symptoms suddenly get worse.  Your arm becomes pale, cool, tingly, or numb.  You have heavy bleeding from the site. Hold pressure on the site. Document Released: 06/17/2010 Document Revised:  08/07/2011 Document Reviewed: 06/17/2010 [facility] Patient Information 2015 [place], [place]. This information is not intended to replace advice given to you by your health care provider. Make sure you discuss any questions you have with your health care provider.  Wound Care Wound care helps prevent pain and infection.  Take showers. Do not take baths, swim, or do anything that puts your wound under water.  GET HELP RIGHT AWAY IF:  Yellowish-white fluid (pus) comes from the wound. Medicine does not lessen your pain. There is a red streak going away from the wound. You have a fever. MAKE SURE YOU:  Understand these instructions. Will watch your condition. Will get help right away if you are not doing well or get worse. Document Released: 02/22/2008 Document Revised: 08/07/2011 Document Reviewed: 09/18/2010 [facility]Exit[facility] Patient Information 2015 [place], [place]. This information is not intended to replace advice given to you by your health care provider. Make sure you discuss any questions you have with your health care provider.

## 2013-12-12 NOTE — Interval H&P Note (Signed)
History and Physical Interval Note:  12/12/2013 10:45 AM  Cody Barnes  has presented today for surgery, with the diagnosis of atrial/mitral valve regirgitation  The various methods of treatment have been discussed with the patient and family. After consideration of risks, benefits and other options for treatment, the patient has consented to  Procedure(Barnes): LEFT AND RIGHT HEART CATHETERIZATION WITH CORONARY ANGIOGRAM (N/A) as a surgical intervention .  The patient'Barnes history has been reviewed, patient examined, no change in status, stable for surgery.  I have reviewed the patient'Barnes chart and labs.  Questions were answered to the patient'Barnes satisfaction.    Preoperative cath; no plans for PCI.  Plan for CT to see if aortic valve sparing operation is a possibility.   Cody Barnes.

## 2013-12-12 NOTE — H&P (View-Only) (Signed)
[address]       [place] [address]             [phone]     CARDIOTHORACIC SURGERY CONSULTATION REPORT  Referring Provider is Corky Crafts, MD PCP is Sanda Linger, MD  Chief Complaint  Patient presents with  . Shortness of Breath    on exertion...MITRAL VALVE REGURGIATION/AORTIC VALVE REGURGITATION...TEE    HPI:  Patient is a 53 year old otherwise healthy white male with long-standing history of heart murmur who was diagnosed with mitral valve prolapse approximately 8 years ago. He has been followed recently by Dr. Eldridge Dace with serial echocardiograms. Over the past 3 months the patient has developed symptoms of exertional shortness of breath. He was seen by his primary care physician who noted a change in the patient's heart murmur and EKG. Early followup with his cardiologist was recommended.  Dr. Eldridge Dace saw the patient in the office on 11/04/2013, and subsequent transthoracic echocardiogram demonstrated the presence of mild aortic insufficiency and mild mitral regurgitation, although both jets were notably eccentric.  To further investigate a transesophageal echocardiogram was performed by Dr. Mayford Knife 11/25/2013. This revealed moderate to severe aortic insufficiency with moderate to severe mitral regurgitation and mild left ventricular chamber enlargement. The patient has been referred for cardiac surgical consultation.  The patient is an Airline pilot and lives with his wife hearing Ginette Otto and works full-time for [employer]. He remains active physically and exercises on a regular basis. He describes a 3 month history of mild exertional shortness of breath. Symptoms only occur with relatively strenuous exertion, such as going up a flight of stairs or jogging. The patient does not get short of breath with mild activity and his breathing has not limited his physical activities significantly. He denies any history of resting shortness of breath,  PND, orthopnea, or lower extremity edema. He has never had any chest pain or chest discomfort.  Past Medical History  Diagnosis Date  . Heart murmur   . Mitral valve prolapse   . Mitral regurgitation   . Aortic insufficiency     Past Surgical History  Procedure Laterality Date  . Hip fracture surgery      left  . Inguinal hernia repair      as infant  . Tee without cardioversion N/A 11/25/2013    Procedure: TRANSESOPHAGEAL ECHOCARDIOGRAM (TEE);  Surgeon: Quintella Reichert, MD;  Location: [facility] END[facility];  Service: Cardiovascular;  Laterality: N/A;    Family History  Problem Relation Age of Onset  . Drug abuse Neg Hx   . Early death Neg Hx   . Heart disease Neg Hx   . Hyperlipidemia Neg Hx   . Hypertension Neg Hx   . Kidney disease Neg Hx   . Stroke Neg Hx   . Arthritis Neg Hx   . Cancer Mother     History   Social History  . Marital Status: Single    Spouse Name: N/A    Number of Children: N/A  . Years of Education: N/A   Occupational History  . Not on file.   Social History Main Topics  . Smoking status: Never Smoker   . Smokeless tobacco: Never Used  . Alcohol Use: 3.0 oz/week    5 Cans of beer per week  . Drug Use: No  . Sexual Activity: Yes   Other Topics Concern  . Not on file   Social History Narrative  . No narrative on file  Current Outpatient Prescriptions  Medication Sig Dispense Refill  . acetaminophen (TYLENOL) 325 MG tablet Take 325 mg by mouth every 6 (six) hours as needed for headache.      . calcium carbonate (OS-CAL) 1250 MG chewable tablet Chew 1 tablet by mouth daily as needed for heartburn.       No current facility-administered medications for this visit.    No Known Allergies    Review of Systems:   General:  normal appetite, normal energy, no weight gain, no weight loss, no fever  Cardiac:  no chest pain with exertion, no chest pain at rest, + SOB with exertion, no resting SOB, no PND, no orthopnea, no palpitations, no  arrhythmia, no atrial fibrillation, no LE edema, no dizzy spells, no syncope  Respiratory:  + exertional shortness of breath, no home oxygen, no productive cough, no dry cough, no bronchitis, no wheezing, no hemoptysis, no asthma, no pain with inspiration or cough, no sleep apnea, no CPAP at night  GI:   no difficulty swallowing, no reflux, no frequent heartburn, no hiatal hernia, no abdominal pain, no constipation, no diarrhea, no hematochezia, no hematemesis, no melena  GU:   no dysuria,  no frequency, no urinary tract infection, no hematuria, no enlarged prostate, no kidney stones, no kidney disease  Vascular:  no pain suggestive of claudication, no pain in feet, no leg cramps, no varicose veins, no DVT, no non-healing foot ulcer  Neuro:   no stroke, no TIA's, no seizures, no headaches, no temporary blindness one eye,  no slurred speech, no peripheral neuropathy, no chronic pain, no instability of gait, no memory/cognitive dysfunction  Musculoskeletal: no arthritis, no joint swelling, no myalgias, no difficulty walking, normal mobility   Skin:   no rash, no itching, no skin infections, no pressure sores or ulcerations  Psych:   no anxiety, no depression, no nervousness, no unusual recent stress  Eyes:   no blurry vision, no floaters, no recent vision changes, + wears glasses or contacts  ENT:   no hearing loss, no loose or painful teeth, no dentures, last saw dentist within 4 months  Hematologic:  no easy bruising, no abnormal bleeding, no clotting disorder, no frequent epistaxis  Endocrine:  no diabetes, does not check CBG's at home     Physical Exam:   BP 132/72  Pulse 53  Resp 16  Ht 6' (1.829 m)  Wt 175 lb (79.379 kg)  BMI 23.73 kg/m2  SpO2 97%  General:  Thin,  well-appearing  HEENT:  Unremarkable   Neck:   no JVD, no bruits, no adenopathy   Chest:   clear to auscultation, symmetrical breath sounds, no wheezes, no rhonchi   CV:   RRR, grade III/VI diastolic murmur best at LSB,  III/VI systolic murmur at apex  Abdomen:  soft, non-tender, no masses   Extremities:  warm, well-perfused, pulses palpable, no LE edema  Rectal/GU  Deferred  Neuro:   Grossly non-focal and symmetrical throughout  Skin:   Clean and dry, no rashes, no breakdown   Diagnostic Tests:  Transthoracic Echocardiography  Patient: Waylyn, Tenbrink MR #: [id number] Study Date: 11/13/2013 Gender: M Age: 75 Height: 182.9 cm Weight: 80.3 kg BSA: 2.02 m^2 Pt. Status: Room:  ATTENDING Nicki Guadalajara, M.D. REFERRING Etta Grandchild ORDERING Epps, Jayadeep S REFERRING [place], [place] S PERFORMING [facility], Outpatient SONOGRAPHER [facility], RDCS  cc:  ------------------------------------------------------------------- LV EF: 50% - 55%  ------------------------------------------------------------------- Indications: 424.0 Mitral valve disease.  ------------------------------------------------------------------- History: PMH: Mitral Valve Prolapse,  Edema, Palpitations. Syncope, dyspnea, and murmur.  ------------------------------------------------------------------- Study Conclusions  - Left ventricle: The cavity size was mildly dilated. There was mild focal basal hypertrophy of the septum. Systolic function was normal. The estimated ejection fraction was in the range of 50% to 55%. Wall motion was normal; there were no regional wall motion abnormalities. Doppler parameters are consistent with abnormal left ventricular relaxation (grade 1 diastolic dysfunction). - Aortic valve: There was mild regurgitation. - Aortic root: The aortic root was mildly dilated. - Mitral valve: Mild prolapse, involving the posterior leaflet. There was mild regurgitation directed eccentrically and anteriorly. - Left atrium: The atrium was mildly dilated.  Impressions:  - Normal LV function; prolapse of posterior MV leafet with mild anteriorly directed MR; mild AI.  Transthoracic  echocardiography. M-mode, complete 2D, spectral Doppler, and color Doppler. Birthdate: Patient birthdate: [birth year]. Age: Patient is 53 yr old. Sex: Gender: male. Height: Height: 182.9 cm. Height: 72 in. Weight: Weight: 80.3 kg. Weight: 176.6 lb. Body mass index: BMI: 24 kg/m^2. Body surface area: BSA: 2.02 m^2. Blood pressure: 104/64 Patient status: Outpatient. Study date: Study date: 11/13/2013. Study time: 08:10 AM. Location: Echo laboratory.  -------------------------------------------------------------------  ------------------------------------------------------------------- Left ventricle: The cavity size was mildly dilated. There was mild focal basal hypertrophy of the septum. Systolic function was normal. The estimated ejection fraction was in the range of 50% to 55%. Wall motion was normal; there were no regional wall motion abnormalities. Doppler parameters are consistent with abnormal left ventricular relaxation (grade 1 diastolic dysfunction).  ------------------------------------------------------------------- Aortic valve: Trileaflet; mildly calcified leaflets. Mobility was not restricted. Doppler: Transvalvular velocity was within the normal range. There was no stenosis. There was mild regurgitation.  ------------------------------------------------------------------- Aorta: Aortic root: The aortic root was mildly dilated.  ------------------------------------------------------------------- Mitral valve: Mildly thickened leaflets . Mobility was not restricted. Mild prolapse, involving the posterior leaflet. Doppler: Transvalvular velocity was within the normal range. There was no evidence for stenosis. There was mild regurgitation directed eccentrically and anteriorly.  ------------------------------------------------------------------- Left atrium: LA Volume/BSA= 34.2 ml/m2. The atrium was  mildly dilated.  ------------------------------------------------------------------- Right ventricle: The cavity size was normal. Systolic function was normal.  ------------------------------------------------------------------- Pulmonic valve: Doppler: Transvalvular velocity was within the normal range. There was no evidence for stenosis.  ------------------------------------------------------------------- Tricuspid valve: Structurally normal valve. Doppler: Transvalvular velocity was within the normal range. There was trivial regurgitation.  ------------------------------------------------------------------- Pulmonary artery: Systolic pressure was within the normal range.  ------------------------------------------------------------------- Right atrium: The atrium was normal in size.  ------------------------------------------------------------------- Pericardium: There was no pericardial effusion.  ------------------------------------------------------------------- Systemic veins: Inferior vena cava: The vessel was normal in size. The respirophasic diameter changes were in the normal range (= 50%), consistent with normal central venous pressure. Diameter: 22 mm.  ------------------------------------------------------------------- Prepared and Electronically Authenticated by  Olga Millers 2015-06-18T10:03:41  ------------------------------------------------------------------- Measurements  IVC Value Reference ID 22 mm ---------  Left ventricle Value Reference LV ID, ED, PLAX chordal (H) 56.4 mm 43 - 52 LV ID, ES, PLAX chordal (H) 50.6 mm 23 - 38 LV fx shortening, PLAX chordal (L) 10 % >=29 LV PW thickness, ED 10.7 mm --------- IVS/LV PW ratio, ED (N) 0.94 <=1.3 LV e&', lateral 12.3 cm/s --------- LV E/e&', lateral 5.3 --------- LV e&', medial 8.01 cm/s --------- LV E/e&', medial 8.14 --------- LV e&', average 10.16 cm/s --------- LV E/e&', average 6.42  ---------  Ventricular septum Value Reference IVS thickness, ED 10.1 mm ---------  Aortic valve Value Reference Aortic valve peak velocity, S 148 cm/s --------- Aortic regurg pressure half-time 641 ms ---------  Aorta Value Reference Aortic root ID, ED 38 mm ---------  Left atrium Value Reference LA ID, A-P, ES 32 mm --------- LA ID/bsa, A-P (N) 1.58 cm/m^2 <=2.2  Mitral valve Value Reference Mitral E-wave peak velocity 65.2 cm/s --------- Mitral A-wave peak velocity 69.1 cm/s --------- Mitral deceleration time (H) 352 ms 150 - 230 Mitral E/A ratio, peak 0.9 ---------  Tricuspid valve Value Reference Tricuspid regurg peak velocity 214 cm/s --------- Tricuspid peak RV-RA gradient 18 mm Hg --------- Tricuspid maximal regurg velocity, 214 cm/s --------- PISA  Right ventricle Value Reference RV s&', lateral, S 12.8 cm/s ---------  Legend: (L) and (H) mark values outside specified reference range.  (N) marks values inside specified reference range.    Transesophageal Echocardiography  Patient: Loni MuseKaufman, Dantonio MR #: [id number] Study Date: 11/25/2013 Gender: M Age: 11 Height: 182.9 cm Weight: 79.5 kg BSA: 2.01 m^2 Pt. Status: Room:  ADMITTING Armanda Magicraci Turner, MD ATTENDING Armanda Magicraci Turner, MD ORDERING Armanda Magicraci Turner, MD PERFORMING Armanda Magicraci Turner, MD REFERRING Armanda Magicraci Turner, MD SONOGRAPHER Lanae CrumblyJimmy Reel, RDCS  cc:  ------------------------------------------------------------------- LV EF: 50% - 55%  ------------------------------------------------------------------- Indications: Mitral valve prolapse 424.0.  ------------------------------------------------------------------- History: PMH: Mitral regurgitation.  ------------------------------------------------------------------- Study Conclusions  - Left ventricle: The cavity size was mildly dilated. Systolic function was normal. The estimated ejection fraction was in the range of 50% to 55%. Wall motion was normal;  there were no regional wall motion abnormalities. - Aortic valve: In the 60 degree view the AR appears moderate but in the 90 degree to 120 degree views the AR appears moderate to severe. There was moderate regurgitation directed centrally in the LVOT and eccentrically in the LVOT. - Aorta: The aorta was mildly dilated. - Mitral valve: There is at least moderate and probably moderate to very eccentric MR directed at the anterior left atrial wall and wraps around the LA. There was no obvious flow reversal in the pulmonary vein. There are several jets of MR noted. Moderate, holosystolicprolapse, involving the posterior leaflet. There was moderate to severe regurgitation. - Left atrium: The atrium was mildly dilated. No evidence of thrombus in the atrial cavity or appendage. - Right atrium: No evidence of thrombus in the atrial cavity or appendage. - Atrial septum: No defect or patent foramen ovale was identified.  Diagnostic transesophageal echocardiography. 2D and color Doppler. Birthdate: Patient birthdate: 01/11/1961. Age: Patient is 53 yr old. Sex: Gender: male. Height: Height: 182.9 cm. Height: 72 in. Weight: Weight: 79.5 kg. Weight: 175 lb. Body mass index: BMI: 23.8 kg/m^2. Body surface area: BSA: 2.01 m^2. Blood pressure: 153/67 Patient status: Outpatient. Study date: Study date: 11/25/2013. Study time: 09:05 AM. Location: Endoscopy.  -------------------------------------------------------------------  ------------------------------------------------------------------- Left ventricle: The cavity size was mildly dilated. Systolic function was normal. The estimated ejection fraction was in the range of 50% to 55%. Wall motion was normal; there were no regional wall motion abnormalities.  ------------------------------------------------------------------- Aortic valve: In the 60 degree view the AR appears moderate but in the 90 degree to 120 degree views the AR appears  moderate to severe. Structurally normal valve. Trileaflet; normal thickness leaflets. Cusp separation was normal. Doppler: There was moderate regurgitation directed centrally in the LVOT and eccentrically in the LVOT.  ------------------------------------------------------------------- Aorta: The aorta was mildly dilated. There was no atheroma. There was no evidence for dissection. Aortic root: The aortic root was not dilated. Ascending aorta: The ascending aorta was normal in size. Aortic arch: The aortic arch was normal in size. Descending aorta: The descending aorta was normal in size.  -------------------------------------------------------------------  Mitral valve: There is at least moderate and probably moderate to very eccentric MR directed at the anterior left atrial wall and wraps around the LA. There was no obvious flow reversal in the pulmonary vein. There are several jets of MR noted. Leaflet separation was normal. Moderate, holosystolicprolapse, involving the posterior leaflet. Doppler: There was moderate to severe regurgitation.  ------------------------------------------------------------------- Left atrium: The atrium was mildly dilated. No evidence of thrombus in the atrial cavity or appendage. The appendage was morphologically a left appendage, multilobulated, and of normal size. Emptying velocity was normal.  ------------------------------------------------------------------- Atrial septum: No defect or patent foramen ovale was identified.  ------------------------------------------------------------------- Right ventricle: The cavity size was normal. Wall thickness was normal. Systolic function was normal.  ------------------------------------------------------------------- Pulmonic valve: Structurally normal valve. Doppler: There was trivial regurgitation.  ------------------------------------------------------------------- Tricuspid valve: Structurally  normal valve. Leaflet separation was normal. Doppler: There was mild regurgitation.  ------------------------------------------------------------------- Pulmonary artery: The main pulmonary artery was normal-sized.  ------------------------------------------------------------------- Right atrium: The atrium was normal in size. No evidence of thrombus in the atrial cavity or appendage. The appendage was morphologically a right appendage.  ------------------------------------------------------------------- Pericardium: There was no pericardial effusion.  ------------------------------------------------------------------- Post procedure conclusions Ascending Aorta:  - The aorta was mildly dilated.  ------------------------------------------------------------------- Prepared and Electronically Authenticated by  Armanda Magic, MD 2015-07-02T03:38:30    Impression:  The patient has stage D severe symptomatic aortic insufficiency and mitral regurgitation.  He has long history of mitral valve prolapse involving the posterior leaflet. He does not have a flail segment, but the severity of mitral regurgitation is at least moderate to severe. The patient's aortic valve is tricuspid and dilated. There is eccentric jet of aortic insufficiency that is at least moderate to severe. The patient describes recent onset of symptoms of exertional shortness of breath. Echocardiogram demonstrates mild left ventricular chamber enlargement. I feel he would best be treated with elective aortic valve repair or replacement and mitral valve repair. His ascending aorta and aortic root should be imaged to further investigate the possibility of significant aneurysmal enlargement of the aortic root. The patient will additionally need diagnostic cardiac catheterization.   Plan:  I discussed matters at length with the patient and his wife in the office today. The rationale for surgical intervention has been discussed  in contrast with continued medical therapy. All of their questions been addressed. We plan to discuss with Dr. Eldridge Dace the possibility of scheduling elective diagnostic catheterization in the near future. I favor cardiac gated CT angiography to further evaluate the aortic root and ascending aorta. Contrast enhanced MRI could be performed as an alternative. The patient will return in 3 weeks after these diagnostic tests at been completed.   I spent in excess of 90 minutes during the conduct of this office consultation and >50% of this time involved direct face-to-face encounter with the patient for counseling and/or coordination of their care.          Level 3 Office Consult = 40 minutes         Level 4 Office Consult = 60 minutes         Level 5 Office Consult = 80 minutes  Anouk Critzer H. Cornelius Moras, MD 12/05/2013 5:47 PM

## 2013-12-15 NOTE — Addendum Note (Signed)
Addended byOrlene Plum: Charlii Yost H on: 12/15/2013 08:27 AM   Modules accepted: Orders

## 2013-12-15 NOTE — Telephone Encounter (Signed)
Message sent to [facility] to schedule.

## 2013-12-15 NOTE — Telephone Encounter (Addendum)
Per Dr. Eldridge DaceVaranasi, Dr. Cornelius Moraswen would like cardiac gated CT angiography to further evaluate the aortic root and ascending aorta.

## 2013-12-15 NOTE — Addendum Note (Signed)
Addended byOrlene Plum: Fenris Cauble H on: 12/15/2013 07:27 AM   Modules accepted: Orders

## 2013-12-15 NOTE — Telephone Encounter (Signed)
Pt.notified

## 2013-12-16 ENCOUNTER — Telehealth: Payer: Self-pay | Admitting: Interventional Cardiology

## 2013-12-16 DIAGNOSIS — I359 Nonrheumatic aortic valve disorder, unspecified: Secondary | ICD-10-CM

## 2013-12-16 NOTE — Telephone Encounter (Signed)
Pt would like copies of all test mailed to them,  thanks

## 2013-12-16 NOTE — Addendum Note (Signed)
Addended byOrlene Plum: Shirlyn Savin H on: 12/16/2013 09:48 AM   Modules accepted: Orders

## 2013-12-16 NOTE — Telephone Encounter (Signed)
Selena BattenKim, can you please mail to patient.

## 2013-12-17 ENCOUNTER — Telehealth: Payer: Self-pay | Admitting: *Deleted

## 2013-12-17 ENCOUNTER — Other Ambulatory Visit: Payer: 59

## 2013-12-17 NOTE — Telephone Encounter (Signed)
Called patient re [facility] Barnes for CT scan.  Advised him it is still in review at [facility] and that we would advise him if it is approved or not.  He voiced understanding.

## 2013-12-17 NOTE — Telephone Encounter (Signed)
Pt has been informed of order for cardiac CTA to be scheduled and read by Dr Shirlee LatchMcLean per Dr Eldridge DaceVaranasi and Dr Barry Dieneswens.  Pt verbalized understanding and very pleased with all the assistance that has been provided by the staff.  Pt is aware that a scheduler will be calling him today to have this set up for this week.

## 2013-12-17 NOTE — Telephone Encounter (Signed)
New message    Patient calling stating his insurance called him stating they approve for the chest . Neck is not approve.     Which test is he suppose to have    Need approval for  Ct of neck  by his insurance . patient  States he  will not pay for this.   Procedure on  7/24 at [facility] [place].

## 2013-12-18 NOTE — Telephone Encounter (Signed)
Emailed Pt ROI, He Will bring In And pick up Copies Of His Medical Records.  I have Everything Ready For him.

## 2013-12-18 NOTE — Addendum Note (Signed)
Addended byOrlene Plum: Kaelei Wheeler H on: 12/18/2013 09:45 AM   Modules accepted: Orders

## 2013-12-19 ENCOUNTER — Ambulatory Visit (HOSPITAL_COMMUNITY): Payer: 59

## 2013-12-22 ENCOUNTER — Encounter: Payer: Self-pay | Admitting: Interventional Cardiology

## 2013-12-24 ENCOUNTER — Ambulatory Visit (HOSPITAL_COMMUNITY): Payer: 59

## 2013-12-25 ENCOUNTER — Other Ambulatory Visit: Payer: Self-pay | Admitting: Interventional Cardiology

## 2013-12-25 ENCOUNTER — Ambulatory Visit (HOSPITAL_COMMUNITY): Admission: RE | Admit: 2013-12-25 | Payer: 59 | Source: Ambulatory Visit

## 2013-12-25 ENCOUNTER — Ambulatory Visit (HOSPITAL_COMMUNITY)
Admission: RE | Admit: 2013-12-25 | Discharge: 2013-12-25 | Disposition: A | Discharge status: Home / Self Care | Payer: 59 | Source: Ambulatory Visit | Attending: Interventional Cardiology | Admitting: Interventional Cardiology

## 2013-12-25 DIAGNOSIS — I359 Nonrheumatic aortic valve disorder, unspecified: Secondary | ICD-10-CM

## 2013-12-25 MED ORDER — IOHEXOL 350 MG/ML SOLN
100.0000 mL | Freq: Once | INTRAVENOUS | Status: AC | PRN
  Administered 2013-12-25: 100 mL via INTRAVENOUS

## 2013-12-29 ENCOUNTER — Encounter: Payer: Self-pay | Admitting: Thoracic Surgery (Cardiothoracic Vascular Surgery)

## 2013-12-29 ENCOUNTER — Ambulatory Visit (INDEPENDENT_AMBULATORY_CARE_PROVIDER_SITE_OTHER): Payer: 59 | Admitting: Thoracic Surgery (Cardiothoracic Vascular Surgery)

## 2013-12-29 VITALS — BP 136/80 | HR 76 | Ht 72.0 in | Wt 175.0 lb

## 2013-12-29 DIAGNOSIS — I351 Nonrheumatic aortic (valve) insufficiency: Secondary | ICD-10-CM

## 2013-12-29 DIAGNOSIS — I359 Nonrheumatic aortic valve disorder, unspecified: Secondary | ICD-10-CM

## 2013-12-29 DIAGNOSIS — I340 Nonrheumatic mitral (valve) insufficiency: Secondary | ICD-10-CM

## 2013-12-29 DIAGNOSIS — I059 Rheumatic mitral valve disease, unspecified: Secondary | ICD-10-CM

## 2013-12-29 NOTE — Progress Notes (Signed)
[address]       [place] [address]             [phone]     CARDIOTHORACIC SURGERY OFFICE NOTE  Referring Provider is Corky Crafts, MD PCP is Sanda Linger, MD   HPI:  The patient returns for followup of severe symptomatic aortic insufficiency and mitral regurgitation. He was originally seen in consultation on 12/05/2013. Since then he underwent left and right heart catheterization by Dr. Eldridge Dace.  He was found to have normal coronary artery anatomy with no significant coronary artery disease. Pulmonary artery pressures were normal.  The patient also underwent CT angiography of the aorta which was notable for only mild dilatation of the aortic root.  The patient returns to the office today to discuss the results of these tests and further discuss treatment options. He reports no new polyps or complaints over the past 4 weeks.   Current Outpatient Prescriptions  Medication Sig Dispense Refill  . acetaminophen (TYLENOL) 325 MG tablet Take 325 mg by mouth every 6 (six) hours as needed for headache.      . calcium carbonate (OS-CAL) 1250 MG chewable tablet Chew 1 tablet by mouth daily as needed for heartburn.       No current facility-administered medications for this visit.      Physical Exam:   BP 136/80  Pulse 76  Ht 6' (1.829 m)  Wt 175 lb (79.379 kg)  BMI 23.73 kg/m2  SpO2 98%  General:  Well-appearing  Chest:   Clear  CV:   Regular rate and rhythm with both systolic and diastolic murmurs  Incisions:  n/a  Abdomen:  Soft nontender  Extremities:  Warm and well-perfused  Diagnostic Tests:  CARDIAC CATHETERIZATION  PROCEDURE: Right heart cath; Left heart catheterization with selective coronary angiography, left ventriculogram.  INDICATIONS: Preoperative evaluation for valve surgery  The risks, benefits, and details of the procedure were explained to the patient. The patient verbalized understanding and wanted to proceed. Informed  written consent was obtained.  PROCEDURE TECHNIQUE: After lidocaine, an antecubital IV was switched out for a 5 French slender sheath over a wire. A 5 French Swan-Ganz catheter was advanced to the pulmonary artery under fluoroscopic guidance. Hemodynamics were measured. Arterial saturation was measured. After Xylocaine anesthesia a 12F slender sheath was placed in the right radial artery with a single anterior needle wall stick. IV heparin was given. Right coronary angiography was done using a Judkins R4 guide catheter. Left coronary angiography was done using a Judkins L3.5 guide catheter. Left ventriculography was done using a pigtail catheter. A TR band was used for hemostasis. Of note, there is some spasm at the proximal radial artery which required intra-arterial nitroglycerin. A hand injection of contrast was performed through the sheath visualizing the vasospasm.  CONTRAST: Total of 70 cc.  COMPLICATIONS: None.  HEMODYNAMICS: Aortic pressure was 132/66; LV pressure was 133/10; LVEDP 14. There was no gradient between the left ventricle and aorta. Right atrial pressure 5/10; Z. ratio pressure 4 mmHg; RV pressure twenty-seven over two, RVEDP 6 mmHg; pulmonary artery pressure 27/10, mean pulmonary artery pressure 17 mmHg; pulmonary capillary wedge pressure 17/14; mean pulmonary capillary wedge pressure 11 mmHg. Aortic saturation 96%. Pulmonary artery saturation 74%.  ANGIOGRAPHIC DATA: The left main coronary artery is widely patent.  The left anterior descending artery is a large vessel which wraps around the apex. The vessel appears angiographically normal. There are several large diagonals which are also  widely patent.  The left circumflex artery is a large vessel which appears widely patent. There is a large ramus vessel which appears widely patent.  The right coronary artery is a large vessel with some mild, proximal ectasia. There is no significant atherosclerosis. The posterior lateral artery is  large and has several branches. The posterior descending artery is small but patent.  LEFT VENTRICULOGRAM: Left ventricular angiogram was done in the 30 RAO projection and revealed normal left ventricular wall motion and systolic function with an estimated ejection fraction of 55%. LVEDP was 14 mmHg. 1+ mitral regurgitation.  IMPRESSIONS:  1. Normal left main coronary artery. 2. Normal left anterior descending artery and its branches. 3. Normal left circumflex artery and its branches. 4. Normal right coronary artery. 5. Normal left ventricular systolic function. LVEDP 14 mmHg. Ejection fraction 55%. 6. Normal PA pressures. CO 6 L/min; Cardiac Index 3.  RECOMMENDATION: Continue plan for valve repair surgery.     CT ANGIOGRAPHY CHEST WITH CONTRAST  TECHNIQUE:  Multidetector CT imaging of the chest was performed using the  standard protocol during bolus administration of intravenous  contrast. Multiplanar CT image reconstructions and MIPs were  obtained to evaluate the vascular anatomy.  CONTRAST: 100mL OMNIPAQUE IOHEXOL 350 MG/ML SOLN  COMPARISON: No priors.  FINDINGS:  Mediastinum: The aortic valve appears to have 3 cusps (although  difficult to say for certain on today's study which was cardiac  gated, but only obtained during late-diastolic phase). The left cusp  of the valve is irregular, and appears to slightly prolapse  resulting in a central area of malcoaptation which is estimated to  measure approximately 13 mm sq by planimetry. The aortic root  measures approximately 32 mm at the annulus, 41 mm at the sinuses of  Valsalva and 30 mm at the sinotubular junction. The ascending  thoracic aorta, mid arch and descending thoracic aorta are normal in  caliber measuring 3.0 cm, 2.8 cm and 2.2 cm in diameter  respectively. No evidence of thoracic aortic dissection. Heart size  is normal. There is no significant pericardial fluid, thickening or  pericardial calcification. Examination  was not tailored for  evaluation of the coronary arteries, however, no coronary artery  calcium or hemodynamically significant stenosis is identified on  today's gated study. No pathologically enlarged mediastinal or hilar  lymph nodes. Esophagus is unremarkable in appearance.  Lungs/Pleura: Small calcified granuloma in the posteromedial aspect  of the left lower lobe. No other suspicious appearing pulmonary  nodules or masses are noted. No acute consolidative airspace  disease. No pleural effusions.  Upper Abdomen: 1.2 x 0.9 cm low-attenuation lesion in the upper pole  of the left kidney is compatible with a small simple cyst.  Musculoskeletal: There are no aggressive appearing lytic or blastic  lesions noted in the visualized portions of the skeleton.  Review of the MIP images confirms the above findings.  IMPRESSION:  1. The aortic root is only mildly dilated measuring 41 mm at the  level of the sinuses of Valsalva. The thoracic aorta is otherwise  normal in caliber, as detailed above.  2. The aortic valve appears to have 3 cusps, however, the left cusp  of the valve is irregular and appears to slightly prolapse resulting  in a small central area of malcoaptation estimated to measure  approximately 13 mm squared by planimetry.  3. Additional incidental findings, as above.  Electronically Signed  By: Trudie Reedaniel Entrikin M.D.  On: 12/25/2013 16:35   Impression:  The patient has  stage D severe symptomatic aortic insufficiency and mitral regurgitation. He has long history of mitral valve prolapse involving the posterior leaflet. He does not have a flail segment, but the severity of mitral regurgitation is at least moderate to severe. The patient's aortic valve is tricuspid and mildly dilated, but he does not have significant aneurysmal enlargement of the aortic root. There is prolapse of the left cusp of the aortic valve with an eccentric jet of aortic insufficiency that is at least moderate  to severe. The patient describes recent onset of symptoms of exertional shortness of breath. Echocardiogram demonstrates mild left ventricular chamber enlargement. I feel he would best be treated with elective aortic valve repair or replacement and mitral valve repair.  I feel there is a very high likelihood that his mitral valve should be repairable with a durable result.  Although the aortic valve may be repairable, I am much less optimistic that it will be repairable, and an attempt at repair may be associated with a considerably less durable result.   Plan:  I spent in excess of 45 minutes reviewing the options with the patient and his wife in the office today. All of their questions have been addressed. The patient desires to tentatively schedule surgery for sometime in September, but he also plans to seek at least one second opinion before making a final decision about surgery. He plans to return here to the office in 4 weeks for followup.  All of his questions been addressed.  I spent in excess of 45 minutes during the conduct of this office consultation and >50% of this time involved direct face-to-face encounter with the patient for counseling and/or coordination of their care.   Salvatore Decent. Cornelius Moras, MD 12/29/2013 6:04 PM

## 2013-12-31 ENCOUNTER — Telehealth: Payer: Self-pay | Admitting: Interventional Cardiology

## 2013-12-31 NOTE — Telephone Encounter (Signed)
lmtrc

## 2013-12-31 NOTE — Telephone Encounter (Signed)
°  Patient would like to speak with you regarding second opinion. Please call and advise.

## 2013-12-31 NOTE — Telephone Encounter (Signed)
Spoke with Cody Barnes regarding second opinion he had with [facility]. He states Dr. Park BreedKahn thinks that his valve are okay and that he doesn't need surgery. He thinks that maybe all of his medical records were not sent. He requests to speak with medical records to see exactly what was sent. Cody Barnes was transferred to medical records.

## 2013-12-31 NOTE — Telephone Encounter (Signed)
Spoke with pt and his wife and they are requesting CD's of echo, cath and TEE. They would like to pick them up tomorrow.

## 2014-01-01 NOTE — Telephone Encounter (Signed)
Please tell the patient that if Dr. Welton FlakesKhan wants to speak to me, he can feel free to call.

## 2014-01-01 NOTE — Telephone Encounter (Signed)
Pt.notified

## 2014-01-01 NOTE — Telephone Encounter (Signed)
Patient Requested another Copy Of Echo & Cardiac Cath On Cd Once Received pt is aware  He can come pick up, we will call When ready.

## 2014-01-12 ENCOUNTER — Telehealth: Payer: Self-pay

## 2014-01-12 ENCOUNTER — Telehealth: Payer: Self-pay | Admitting: Cardiology

## 2014-01-12 NOTE — Telephone Encounter (Signed)
Spoke to the patient.  He has given me permission to contact Dr. Glyn [facility]Venu Menon at [facility],who is a cardiologist, and inform him of the upcoming surgery.

## 2014-01-12 NOTE — Telephone Encounter (Signed)
Pt called stating he will be going to the [facility] on Oct 28th to have mitral valve repaired and aortic valve replacement. Dr. Rhoderick MoodyMark Gillinov will be doing the surgery.

## 2014-01-12 NOTE — Telephone Encounter (Signed)
MR Cody Barnes canceled appt's and surgery. He will be seeing Dr Julianne RiceMarc Gillinov at the [facility] for treatment.

## 2014-01-21 ENCOUNTER — Other Ambulatory Visit (HOSPITAL_COMMUNITY): Payer: Managed Care, Other (non HMO)

## 2014-01-26 ENCOUNTER — Ambulatory Visit: Payer: 59 | Admitting: Thoracic Surgery (Cardiothoracic Vascular Surgery)

## 2014-03-25 ENCOUNTER — Telehealth: Payer: Self-pay | Admitting: Interventional Cardiology

## 2014-03-25 NOTE — Telephone Encounter (Signed)
Dr. Glyn AdeVenu Menon.  I have already sent him a brief history of Cody Barnes.

## 2014-03-25 NOTE — Telephone Encounter (Signed)
Will forward to Dr Varanasi 

## 2014-03-25 NOTE — Telephone Encounter (Signed)
New message          Pt wife is calling to get the name of your Eldridge Dace(Varanasi) friend that [facility]

## 2014-03-25 NOTE — Telephone Encounter (Signed)
The patient's wife was given Dr. Renette ButtersMenon's name.

## 2014-03-26 DIAGNOSIS — J9811 Atelectasis: Secondary | ICD-10-CM | POA: Insufficient documentation

## 2014-03-26 DIAGNOSIS — G8918 Other acute postprocedural pain: Secondary | ICD-10-CM | POA: Insufficient documentation

## 2014-03-27 DIAGNOSIS — I2510 Atherosclerotic heart disease of native coronary artery without angina pectoris: Secondary | ICD-10-CM | POA: Insufficient documentation

## 2014-03-30 ENCOUNTER — Telehealth: Payer: Self-pay | Admitting: Interventional Cardiology

## 2014-03-30 DIAGNOSIS — R600 Localized edema: Secondary | ICD-10-CM | POA: Insufficient documentation

## 2014-03-30 NOTE — Telephone Encounter (Signed)
New problem   Pt's wife stated pt need an appt in 2wks for f/u heart surgery. Pt's wife don't want pt to see a PA. Please advise.

## 2014-03-30 NOTE — Telephone Encounter (Signed)
It looks like he was having valve [facility] on 10/[facility]. We can offer him Monday 04/06/14 at 12:00 pm with Dr. Eldridge DaceVaranasi. He will need to have his records from his surgery forwarded to us or bring them with him. Please contact patient to schedule.

## 2014-03-31 NOTE — Telephone Encounter (Signed)
Follow Up        Pt's wife calling in regards to pt being seen by Dr. Eldridge DaceVaranasi. I offered appt for Monday 04/06/14 at 12:00pm, wife states they will be coming back into town on that day and pt won't be able to make appt. Pt's wife stating that he can come to any appt scheduled for the week of Nov 16th or the week of Nov 23rd. Please call pt back and advise.

## 2014-04-01 NOTE — Telephone Encounter (Signed)
I spoke with Cody Barnes. The patient has been scheduled to follow up with Dr. Eldridge DaceVaranasi on 04/21/14 at 2:00 pm.

## 2014-04-01 NOTE — Telephone Encounter (Signed)
I left a message for the Mrs. Carlos AmericanKaufman to please call back. We can work him in on 04/21/14 with Dr. Eldridge DaceVaranasi.

## 2014-04-10 ENCOUNTER — Ambulatory Visit: Payer: 59 | Admitting: Internal Medicine

## 2014-04-13 ENCOUNTER — Other Ambulatory Visit (INDEPENDENT_AMBULATORY_CARE_PROVIDER_SITE_OTHER): Payer: 59

## 2014-04-13 ENCOUNTER — Encounter: Payer: Self-pay | Admitting: Internal Medicine

## 2014-04-13 ENCOUNTER — Ambulatory Visit (INDEPENDENT_AMBULATORY_CARE_PROVIDER_SITE_OTHER): Payer: 59 | Admitting: Internal Medicine

## 2014-04-13 VITALS — BP 102/70 | HR 73 | Temp 98.0°F | Resp 18 | Ht 72.0 in | Wt 173.0 lb

## 2014-04-13 DIAGNOSIS — D62 Acute posthemorrhagic anemia: Secondary | ICD-10-CM

## 2014-04-13 DIAGNOSIS — T402X5A Adverse effect of other opioids, initial encounter: Secondary | ICD-10-CM | POA: Insufficient documentation

## 2014-04-13 DIAGNOSIS — K5909 Other constipation: Secondary | ICD-10-CM | POA: Diagnosis not present

## 2014-04-13 DIAGNOSIS — I340 Nonrheumatic mitral (valve) insufficiency: Secondary | ICD-10-CM

## 2014-04-13 DIAGNOSIS — M542 Cervicalgia: Secondary | ICD-10-CM

## 2014-04-13 DIAGNOSIS — K5903 Drug induced constipation: Secondary | ICD-10-CM

## 2014-04-13 DIAGNOSIS — N410 Acute prostatitis: Secondary | ICD-10-CM

## 2014-04-13 DIAGNOSIS — F5105 Insomnia due to other mental disorder: Secondary | ICD-10-CM

## 2014-04-13 DIAGNOSIS — N138 Other obstructive and reflux uropathy: Secondary | ICD-10-CM

## 2014-04-13 DIAGNOSIS — N401 Enlarged prostate with lower urinary tract symptoms: Secondary | ICD-10-CM

## 2014-04-13 DIAGNOSIS — F409 Phobic anxiety disorder, unspecified: Secondary | ICD-10-CM | POA: Insufficient documentation

## 2014-04-13 DIAGNOSIS — I351 Nonrheumatic aortic (valve) insufficiency: Secondary | ICD-10-CM

## 2014-04-13 LAB — CBC WITH DIFFERENTIAL/PLATELET
Basophils Absolute: 0 10*3/uL (ref 0.0–0.1)
Basophils Relative: 0.4 % (ref 0.0–3.0)
Eosinophils Absolute: 0.7 10*3/uL (ref 0.0–0.7)
Eosinophils Relative: 7.2 % — ABNORMAL HIGH (ref 0.0–5.0)
HCT: 33.6 % — ABNORMAL LOW (ref 39.0–52.0)
Hemoglobin: 11 g/dL — ABNORMAL LOW (ref 13.0–17.0)
Lymphocytes Relative: 15.2 % (ref 12.0–46.0)
Lymphs Abs: 1.4 10*3/uL (ref 0.7–4.0)
MCHC: 32.8 g/dL (ref 30.0–36.0)
MCV: 86.6 fl (ref 78.0–100.0)
Monocytes Absolute: 1.2 10*3/uL — ABNORMAL HIGH (ref 0.1–1.0)
Monocytes Relative: 13.6 % — ABNORMAL HIGH (ref 3.0–12.0)
Neutro Abs: 5.8 10*3/uL (ref 1.4–7.7)
Neutrophils Relative %: 63.6 % (ref 43.0–77.0)
Platelets: 465 10*3/uL — ABNORMAL HIGH (ref 150.0–400.0)
RBC: 3.88 Mil/uL — ABNORMAL LOW (ref 4.22–5.81)
RDW: 13.6 % (ref 11.5–15.5)
WBC: 9.1 10*3/uL (ref 4.0–10.5)

## 2014-04-13 LAB — URINALYSIS, ROUTINE W REFLEX MICROSCOPIC
Hgb urine dipstick: NEGATIVE
Ketones, ur: NEGATIVE
Leukocytes, UA: NEGATIVE
Nitrite: NEGATIVE
Specific Gravity, Urine: 1.025 (ref 1.000–1.030)
Total Protein, Urine: NEGATIVE
Urine Glucose: NEGATIVE
Urobilinogen, UA: 0.2 (ref 0.0–1.0)
pH: 5.5 (ref 5.0–8.0)

## 2014-04-13 LAB — COMPREHENSIVE METABOLIC PANEL
ALT: 39 U/L (ref 0–53)
AST: 20 U/L (ref 0–37)
Albumin: 3.6 g/dL (ref 3.5–5.2)
Alkaline Phosphatase: 204 U/L — ABNORMAL HIGH (ref 39–117)
BUN: 18 mg/dL (ref 6–23)
CO2: 27 mEq/L (ref 19–32)
Calcium: 9.3 mg/dL (ref 8.4–10.5)
Chloride: 103 mEq/L (ref 96–112)
Creatinine, Ser: 1 mg/dL (ref 0.4–1.5)
GFR: 79.12 mL/min (ref >60.00)
Glucose, Bld: 79 mg/dL (ref 70–99)
Potassium: 4.6 mEq/L (ref 3.5–5.1)
Sodium: 138 mEq/L (ref 135–145)
Total Bilirubin: 0.6 mg/dL (ref 0.2–1.2)
Total Protein: 6.4 g/dL (ref 6.0–8.3)

## 2014-04-13 LAB — IBC PANEL
Iron: 18 ug/dL — ABNORMAL LOW (ref 42–165)
Saturation Ratios: 6.5 % — ABNORMAL LOW (ref 20.0–50.0)
Transferrin: 199.3 mg/dL — ABNORMAL LOW (ref 212.0–360.0)

## 2014-04-13 LAB — VITAMIN B12: Vitamin B-12: 482 pg/mL (ref 211–911)

## 2014-04-13 LAB — PSA: PSA: 12.51 ng/mL — ABNORMAL HIGH (ref 0.10–4.00)

## 2014-04-13 LAB — TSH: TSH: 1.19 u[IU]/mL (ref 0.35–4.50)

## 2014-04-13 LAB — FERRITIN: Ferritin: 254.5 ng/mL (ref 22.0–322.0)

## 2014-04-13 LAB — FECAL OCCULT BLOOD, GUAIAC: Fecal Occult Blood: NEGATIVE

## 2014-04-13 LAB — FOLATE: Folate: >24.5 ng/mL (ref >5.9)

## 2014-04-13 MED ORDER — NALOXEGOL OXALATE 25 MG PO TABS: 1.0000 | ORAL_TABLET | Freq: Every day | ORAL | Status: DC

## 2014-04-13 MED ORDER — SUVOREXANT 10 MG PO TABS: 1.0000 | ORAL_TABLET | Freq: Every evening | ORAL | Status: DC | PRN

## 2014-04-13 MED ORDER — CIPROFLOXACIN HCL 500 MG PO TABS: 500.0000 mg | ORAL_TABLET | Freq: Two times a day (BID) | ORAL | Status: DC

## 2014-04-13 NOTE — Assessment & Plan Note (Signed)
He will start movantik for this

## 2014-04-13 NOTE — Progress Notes (Signed)
Pre visit review using our clinic review tool, if applicable. No additional management support is needed unless otherwise documented below in the visit note. 

## 2014-04-13 NOTE — Assessment & Plan Note (Signed)
I will check his UA and urine culture Will start cipro for the infection

## 2014-04-13 NOTE — Assessment & Plan Note (Signed)
He will try belsomra for this 

## 2014-04-13 NOTE — Assessment & Plan Note (Signed)
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 He requests to hold off on the E/T if this at this time since there are so many other things going on He will cont his current pain meds Will consider xrays of the cervical spine is this does not resolve soon

## 2014-04-13 NOTE — Patient Instructions (Signed)
Anemia, Nonspecific Anemia is a condition in which the concentration of red blood cells or hemoglobin in the blood is below normal. Hemoglobin is a substance in red blood cells that carries oxygen to the tissues of the body. Anemia results in not enough oxygen reaching these tissues.  CAUSES  Common causes of anemia include:   Excessive bleeding. Bleeding may be internal or external. This includes excessive bleeding from periods (in women) or from the intestine.   Poor nutrition.   Chronic kidney, thyroid, and liver disease.  Bone marrow disorders that decrease red blood cell production.  Cancer and treatments for cancer.  HIV, AIDS, and their treatments.  Spleen problems that increase red blood cell destruction.  Blood disorders.  Excess destruction of red blood cells due to infection, medicines, and autoimmune disorders. SIGNS AND SYMPTOMS   Minor weakness.   Dizziness.   Headache.  Palpitations.   Shortness of breath, especially with exercise.   Paleness.  Cold sensitivity.  Indigestion.  Nausea.  Difficulty sleeping.  Difficulty concentrating. Symptoms may occur suddenly or they may develop slowly.  DIAGNOSIS  Additional blood tests are often needed. These help your health care provider determine the best treatment. Your health care provider will check your stool for blood and look for other causes of blood loss.  TREATMENT  Treatment varies depending on the cause of the anemia. Treatment can include:   Supplements of iron, vitamin B12, or folic acid.   Hormone medicines.   A blood transfusion. This may be needed if blood loss is severe.   Hospitalization. This may be needed if there is significant continual blood loss.   Dietary changes.  Spleen removal. HOME CARE INSTRUCTIONS Keep all follow-up appointments. It often takes many weeks to correct anemia, and having your health care provider check on your condition and your response to  treatment is very important. SEEK IMMEDIATE MEDICAL CARE IF:   You develop extreme weakness, shortness of breath, or chest pain.   You become dizzy or have trouble concentrating.  You develop heavy vaginal bleeding.   You develop a rash.   You have bloody or black, tarry stools.   You faint.   You vomit up blood.   You vomit repeatedly.   You have abdominal pain.  You have a fever or persistent symptoms for more than 2-3 days.   You have a fever and your symptoms suddenly get worse.   You are dehydrated.  MAKE SURE YOU:  Understand these instructions.  Will watch your condition.  Will get help right away if you are not doing well or get worse. Document Released: 06/22/2004 Document Revised: 01/15/2013 Document Reviewed: 11/08/2012 Exit[employer] Patient Information 2015 [employer]. This information is not intended to replace advice given to you by your health care provider. Make sure you discuss any questions you have with your health care provider.  

## 2014-04-13 NOTE — Assessment & Plan Note (Signed)
He tells me that he was anemic after the surgery Will recheck his CBC and vitamin levels today

## 2014-04-13 NOTE — Progress Notes (Signed)
Subjective:    Patient ID: Cody Barnes, male    DOB: [birth year], 53 y.o.   MRN: [id number]  Anemia Presents for follow-up visit. The condition has lasted for 3 weeks. Symptoms include light-headedness, malaise/fatigue and paresthesias (N/T in LUE). There has been no abdominal pain, anorexia, bruising/bleeding easily, confusion, fever, leg swelling, pallor, palpitations, pica or weight loss. Signs of blood loss that are not present include hematemesis, hematochezia and melena. Past treatments include nothing. Past medical history includes recent illness and recent surgery. There is no history of alcohol abuse, cancer or clotting disorder.      Review of Systems  Constitutional: Positive for malaise/fatigue and fatigue. Negative for fever, chills, weight loss, diaphoresis and appetite change.  HENT: Negative.  Negative for trouble swallowing and voice change.   Eyes: Negative.   Respiratory: Negative.   Cardiovascular: Positive for chest pain (sharp pain over the sternum associated with movement). Negative for palpitations and leg swelling.  Gastrointestinal: Positive for constipation. Negative for nausea, vomiting, abdominal pain, diarrhea, blood in stool, melena, hematochezia, abdominal distention, anal bleeding, rectal pain, anorexia and hematemesis.  Endocrine: Negative.   Genitourinary: Positive for dysuria, frequency and difficulty urinating. Negative for urgency, hematuria, flank pain, decreased urine volume, discharge, penile swelling, scrotal swelling, enuresis, genital sores, penile pain and testicular pain.  Musculoskeletal: Positive for neck pain. Negative for myalgias, back pain, joint swelling, gait problem and neck stiffness.       He has had intermittent sharp left neck pain with an area of numbness over his left distal humerus area, there was no trauma or injury but he was held in an unusual position after his surgery 3 weeks ago.  Skin: Negative.  Negative for pallor.    Allergic/Immunologic: Negative.   Neurological: Positive for light-headedness, numbness and paresthesias (N/T in LUE). Negative for dizziness, tremors, seizures, syncope, facial asymmetry, weakness and headaches.  Hematological: Negative.  Negative for adenopathy. Does not bruise/bleed easily.  Psychiatric/Behavioral: Positive for sleep disturbance. Negative for suicidal ideas, hallucinations, behavioral problems, confusion, self-injury, dysphoric mood, decreased concentration and agitation. The patient is not nervous/anxious and is not hyperactive.        Objective:   Physical Exam  Constitutional: He is oriented to person, place, and time. He appears well-developed and well-nourished. No distress.  HENT:  Head: Normocephalic and atraumatic.  Mouth/Throat: Oropharynx is clear and moist.  Eyes: Conjunctivae are normal. Right eye exhibits no discharge. Left eye exhibits no discharge. No scleral icterus.  Neck: Normal range of motion. Neck supple. No JVD present. No tracheal deviation present. No thyromegaly present.  Cardiovascular: Normal rate, regular rhythm, S1 normal and S2 normal.  Exam reveals no gallop, no S3, no S4 and no friction rub.   Murmur heard.  Systolic murmur is present with a grade of 1/6   No diastolic murmur is present  Pulses:      Carotid pulses are 1+ on the right side, and 1+ on the left side.      Radial pulses are 1+ on the right side, and 1+ on the left side.       Femoral pulses are 1+ on the right side, and 1+ on the left side.      Popliteal pulses are 1+ on the right side, and 1+ on the left side.       Dorsalis pedis pulses are 1+ on the right side, and 1+ on the left side.       Posterior tibial pulses are 1+ on  the right side, and 1+ on the left side.  Pulmonary/Chest: Effort normal and breath sounds normal. No stridor. No respiratory distress. He has no wheezes. He has no rales. He exhibits no tenderness.  Abdominal: Soft. Bowel sounds are normal. He  exhibits no distension and no mass. There is no tenderness. There is no rebound and no guarding. Hernia confirmed negative in the right inguinal area and confirmed negative in the left inguinal area.  Genitourinary: Rectum normal, testes normal and penis normal. Rectal exam shows no external hemorrhoid, no internal hemorrhoid, no fissure, no mass, no tenderness and anal tone normal. Guaiac negative stool. Prostate is enlarged (right gland is 2+ enlarged boggy) and tender (right lobe). Right testis shows no mass, no swelling and no tenderness. Right testis is descended. Left testis shows no mass, no swelling and no tenderness. Left testis is descended. Circumcised. No penile erythema or penile tenderness. No discharge found.  Musculoskeletal: Normal range of motion. He exhibits no edema or tenderness.  Lymphadenopathy:    He has no cervical adenopathy.       Right: No inguinal adenopathy present.       Left: No inguinal adenopathy present.  Neurological: He is alert and oriented to person, place, and time. He has normal strength. He displays no atrophy and no tremor. No cranial nerve deficit or sensory deficit. He exhibits normal muscle tone. He displays a negative Romberg sign. He displays no seizure activity. Coordination and gait normal.  Reflex Scores:      Tricep reflexes are 1+ on the right side and 2+ on the left side.      Bicep reflexes are 1+ on the right side and 2+ on the left side.      Brachioradialis reflexes are 1+ on the right side and 2+ on the left side.      Patellar reflexes are 1+ on the right side and 1+ on the left side.      Achilles reflexes are 1+ on the right side and 1+ on the left side. Skin: Skin is warm and dry. He is not diaphoretic. No erythema. No pallor.  Psychiatric: He has a normal mood and affect. His behavior is normal. Judgment and thought content normal.  Vitals reviewed.    Lab Results  Component Value Date   WBC 6.7 12/10/2013   HGB 14.8 12/10/2013    HCT 44.8 12/10/2013   PLT 213.0 12/10/2013   GLUCOSE 121* 12/10/2013   CHOL 197 10/02/2013   TRIG 70.0 10/02/2013   HDL 46.80 10/02/2013   LDLCALC 136* 10/02/2013   ALT 38 10/02/2013   AST 20 10/02/2013   NA 137 12/10/2013   K 4.1 12/10/2013   CL 104 12/10/2013   CREATININE 1.2 12/10/2013   CREATININE 1.2 12/10/2013   BUN 18 12/10/2013   BUN 18 12/10/2013   CO2 26 12/10/2013   TSH 1.54 10/02/2013   PSA 0.94 09/25/2012   INR 1.0 12/10/2013       Assessment & Plan:

## 2014-04-14 ENCOUNTER — Telehealth: Payer: Self-pay | Admitting: *Deleted

## 2014-04-14 ENCOUNTER — Other Ambulatory Visit: Payer: Self-pay | Admitting: Internal Medicine

## 2014-04-14 ENCOUNTER — Encounter: Payer: Self-pay | Admitting: Internal Medicine

## 2014-04-14 DIAGNOSIS — D509 Iron deficiency anemia, unspecified: Secondary | ICD-10-CM

## 2014-04-14 LAB — CULTURE, URINE COMPREHENSIVE
Colony Count: NO GROWTH
Organism ID, Bacteria: NO GROWTH

## 2014-04-14 MED ORDER — FERROUS SULFATE 325 (65 FE) MG PO TABS: 325.0000 mg | ORAL_TABLET | Freq: Two times a day (BID) | ORAL | Status: DC

## 2014-04-14 MED ORDER — PUREFE PLUS 106-1 MG PO CAPS: 1.0000 | ORAL_CAPSULE | Freq: Two times a day (BID) | ORAL | Status: DC

## 2014-04-14 NOTE — Telephone Encounter (Signed)
[facility] called about rx for iron supplement that was sent in this morning. Pharmacist said that they never heard of this brand and they do not have this med in their stock. Is there something else the came be given?

## 2014-04-14 NOTE — Telephone Encounter (Signed)
changed

## 2014-04-21 ENCOUNTER — Telehealth: Payer: Self-pay | Admitting: *Deleted

## 2014-04-21 ENCOUNTER — Ambulatory Visit (INDEPENDENT_AMBULATORY_CARE_PROVIDER_SITE_OTHER): Payer: 59 | Admitting: Interventional Cardiology

## 2014-04-21 ENCOUNTER — Encounter: Payer: Self-pay | Admitting: Interventional Cardiology

## 2014-04-21 VITALS — BP 100/80 | HR 68 | Ht 72.0 in | Wt 171.1 lb

## 2014-04-21 DIAGNOSIS — I351 Nonrheumatic aortic (valve) insufficiency: Secondary | ICD-10-CM

## 2014-04-21 DIAGNOSIS — Z9889 Other specified postprocedural states: Secondary | ICD-10-CM

## 2014-04-21 DIAGNOSIS — I340 Nonrheumatic mitral (valve) insufficiency: Secondary | ICD-10-CM

## 2014-04-21 DIAGNOSIS — Z952 Presence of prosthetic heart valve: Secondary | ICD-10-CM

## 2014-04-21 DIAGNOSIS — Z954 Presence of other heart-valve replacement: Secondary | ICD-10-CM

## 2014-04-21 NOTE — Patient Instructions (Signed)
You have been referred to cardiac rehab Your physician wants you to follow-up in: 3 months with Dr. Eldridge DaceVaranasi.   You will receive a reminder letter in the mail two months in advance. If you don't receive a letter, please call our office to schedule the follow-up appointment.

## 2014-04-21 NOTE — Telephone Encounter (Signed)
Cardiac rehab request/referral form placed in nurse fax bin in medical records.

## 2014-04-21 NOTE — Progress Notes (Signed)
Patient ID: Cody Barnes Cody Barnes, male   DOB: 07-29-1960, 53 y.o.   MRN: [id number]    [address], [address] [place], [place]  [address] Phone: [phone] Fax:  [phone]  Date:  04/21/2014   ID:  Cody Barnes Cody Barnes, DOB 07-29-1960, MRN [id number]  PCP:  Sanda Lingerhomas Jones, MD      History of Present Illness: Cody Barnes [name] is a 53 y.o. male who has had mitral valve prolapse for many years. He was followed with serial echoes. He was noted to have increased LV size at the time of his last echocardiogram with some symptoms of dyspnea on exertion despite preserved LVEF. He was referred for surgery. He chose to [facility]. He underwent mitral valve repair and aortic valve replacement due to aortic insufficiency as well. He chose to have a bioprosthetic valve. Cardiac cath prior to the valve surgery was negative for significant coronary artery disease.  He has done well since his surgery. [place]  Overall, he feels that he is doing well. He still has some pain in his chest at the surgical site. He wants to when he can drive. He wants to know when he can sleep on his stomach. He is also interested in cardiac rehabilitation. He did have an echocardiogram after the surgery [facility].   Wt Readings from Last 3 Encounters:  04/21/14 171 lb 1.9 oz (77.62 kg)  04/13/14 173 lb (78.472 kg)  12/29/13 175 lb (79.379 kg)     Past Medical History  Diagnosis Date  . Heart murmur   . Mitral valve prolapse   . Mitral regurgitation   . Aortic insufficiency     Current Outpatient Prescriptions  Medication Sig Dispense Refill  . aspirin 81 MG chewable tablet Chew 162 mg by mouth daily.     . B Complex-C (SUPER B COMPLEX PO) Take 1 tablet by mouth daily.    . ciprofloxacin (CIPRO) 500 MG tablet Take 1 tablet (500 mg total) by mouth 2 (two) times daily. 60 tablet 0  . docusate sodium (COLACE) 100 MG capsule Take 100 mg by mouth daily.    . ferrous  sulfate (FEOSOL) 325 (65 FE) MG tablet Take 1 tablet (325 mg total) by mouth 2 (two) times daily with a meal. 60 tablet 2  . metoprolol tartrate (LOPRESSOR) 25 MG tablet Take 25 mg by mouth 2 (two) times daily.     . Suvorexant (BELSOMRA) 10 MG TABS Take 1 tablet by mouth at bedtime as needed. 30 tablet 5  . oxyCODONE-acetaminophen (PERCOCET) 5-325 MG per tablet Take 1 tablet by mouth every 6 (six) hours as needed. Take 1-2 by mouth every 6 hours as needed for moderate to severe pain.    . pantoprazole (PROTONIX) 20 MG tablet Take 20 mg by mouth daily.      No current facility-administered medications for this visit.    Allergies:   No Known Allergies  Social History:  The patient  reports that he has never smoked. He has never used smokeless tobacco. He reports that he drinks about 3.0 oz of alcohol per week. He reports that he does not use illicit drugs.   Family History:  The patient's family history includes Cancer in his mother. There is no history of Drug abuse, Early death, Heart disease, Hyperlipidemia, Hypertension, Kidney disease, Stroke, or Arthritis.   ROS:  Please see the history of present illness.  No nausea, vomiting.  No fevers, chills.  No focal weakness.  No dysuria.    All other systems reviewed and negative.   PHYSICAL EXAM: VS:  BP 100/80 mmHg  Pulse 68  Ht 6' (1.829 m)  Wt 171 lb 1.9 oz (77.62 kg)  BMI 23.20 kg/m2 General: Well developed, well nourished, in no acute distress HEENT: normal Neck: no JVD, no carotid bruits Cardiac:  normal S1, S2; RRR;  Lungs:  clear to auscultation bilaterally, no wheezing, rhonchi or rales Abd: soft, nontender, no hepatomegaly Ext: no edema Skin: warm and dry Neuro:   no focal abnormalities noted Psych: normal affect    ASSESSMENT AND PLAN:  1. Mitral valve disease/aortic valve replacement: Status post mitral valve repair. He needs SBE prophylaxis. No signs of heart failure. We'll try to obtain CD of echocardiogram post  surgery.  Refer for cardiac rehabilitation. He will [facility] with his [facility] and [facility]. I suspect he will be allowed to drive. I asked him to avoid sleeping on his stomach until the three-month restriction on lifting is over.  He was not given anticoagulation postoperatively. Continue aspirin 81 mg daily for at least 3 months postoperatively. I will see him back in 3 months.    Signed, Fredric MareJay S. Evonte Prestage, MD, [facility] 04/21/2014 3:00 PM

## 2014-04-29 ENCOUNTER — Ambulatory Visit (INDEPENDENT_AMBULATORY_CARE_PROVIDER_SITE_OTHER): Payer: 59 | Admitting: Internal Medicine

## 2014-04-29 ENCOUNTER — Encounter: Payer: Self-pay | Admitting: Internal Medicine

## 2014-04-29 ENCOUNTER — Other Ambulatory Visit: Payer: Self-pay | Admitting: *Deleted

## 2014-04-29 VITALS — BP 102/64 | HR 91 | Temp 98.1°F | Resp 16 | Ht 72.0 in | Wt 174.0 lb

## 2014-04-29 DIAGNOSIS — N410 Acute prostatitis: Secondary | ICD-10-CM

## 2014-04-29 DIAGNOSIS — D509 Iron deficiency anemia, unspecified: Secondary | ICD-10-CM

## 2014-04-29 MED ORDER — FERROUS SULFATE 325 (65 FE) MG PO TABS: 325.0000 mg | ORAL_TABLET | Freq: Two times a day (BID) | ORAL | Status: DC

## 2014-04-29 NOTE — Progress Notes (Signed)
Pre visit review using our clinic review tool, if applicable. No additional management support is needed unless otherwise documented below in the visit note. 

## 2014-04-29 NOTE — Assessment & Plan Note (Signed)
He is doing well on cipro

## 2014-04-29 NOTE — Telephone Encounter (Signed)
Resent iron rx.

## 2014-04-29 NOTE — Progress Notes (Signed)
   Subjective:    Patient ID: Cody MuseStuart Barnes, male    DOB: 16-May-1961, 53 y.o.   MRN: [id number]  Anemia Presents for follow-up visit. Symptoms include malaise/fatigue. There has been no abdominal pain, anorexia, bruising/bleeding easily, confusion, fever, light-headedness, palpitations, paresthesias, pica or weight loss. Signs of blood loss that are not present include hematemesis, hematochezia and melena. Past treatments include nothing. Past medical history includes recent surgery. Compliance problems include psychosocial issues.       Review of Systems  Constitutional: Positive for malaise/fatigue. Negative for fever and weight loss.  HENT: Negative.   Eyes: Negative.   Respiratory: Positive for shortness of breath. Negative for cough, choking, chest tightness, wheezing and stridor.   Cardiovascular: Negative.  Negative for chest pain, palpitations and leg swelling.  Gastrointestinal: Negative.  Negative for vomiting, abdominal pain, diarrhea, constipation, blood in stool, melena, hematochezia, anorexia and hematemesis.  Endocrine: Negative.   Genitourinary: Negative.  Negative for dysuria, urgency, discharge, difficulty urinating, penile pain and testicular pain.  Musculoskeletal: Negative.  Negative for myalgias, back pain, arthralgias and neck pain.  Skin: Negative.  Negative for rash.  Allergic/Immunologic: Negative.   Neurological: Negative.  Negative for dizziness, syncope, speech difficulty, light-headedness, numbness, headaches and paresthesias.  Hematological: Negative.  Negative for adenopathy. Does not bruise/bleed easily.  Psychiatric/Behavioral: Negative.  Negative for confusion.       Objective:   Physical Exam  Constitutional: He is oriented to person, place, and time. He appears well-developed and well-nourished. No distress.  HENT:  Head: Normocephalic and atraumatic.  Mouth/Throat: Oropharynx is clear and moist. No oropharyngeal exudate.  Eyes: Conjunctivae are  normal. Right eye exhibits no discharge. Left eye exhibits no discharge. No scleral icterus.  Neck: Normal range of motion. Neck supple. No JVD present. No tracheal deviation present. No thyromegaly present.  Cardiovascular: Normal rate, regular rhythm, S1 normal, S2 normal and intact distal pulses.  Exam reveals no gallop, no S3, no S4 and no friction rub.   Murmur heard.  Decrescendo systolic murmur is present with a grade of 1/6   Decrescendo diastolic murmur is present with a grade of 2/6  Pulmonary/Chest: Effort normal and breath sounds normal. No stridor. No respiratory distress. He has no wheezes. He has no rales. He exhibits no tenderness.  Abdominal: Soft. Bowel sounds are normal. He exhibits no distension and no mass. There is no tenderness. There is no rebound and no guarding.  Musculoskeletal: Normal range of motion. He exhibits no edema or tenderness.  Lymphadenopathy:    He has no cervical adenopathy.  Neurological: He is oriented to person, place, and time.  Skin: Skin is warm and dry. No rash noted. He is not diaphoretic. No erythema. No pallor.  Vitals reviewed.    Lab Results  Component Value Date   WBC 9.1 04/13/2014   HGB 11.0* 04/13/2014   HCT 33.6* 04/13/2014   PLT 465.0* 04/13/2014   GLUCOSE 79 04/13/2014   CHOL 197 10/02/2013   TRIG 70.0 10/02/2013   HDL 46.80 10/02/2013   LDLCALC 136* 10/02/2013   ALT 39 04/13/2014   AST 20 04/13/2014   NA 138 04/13/2014   K 4.6 04/13/2014   CL 103 04/13/2014   CREATININE 1.0 04/13/2014   BUN 18 04/13/2014   CO2 27 04/13/2014   TSH 1.19 04/13/2014   PSA 12.51* 04/13/2014   INR 1.0 12/10/2013       Assessment & Plan:

## 2014-04-29 NOTE — Assessment & Plan Note (Signed)
He has not started the iron yet and remains symptomatic from the anemia He agrees to start the iron therapy

## 2014-04-30 ENCOUNTER — Encounter (HOSPITAL_COMMUNITY)
Admission: RE | Admit: 2014-04-30 | Discharge: 2014-04-30 | Disposition: A | Discharge status: Home / Self Care | Payer: 59 | Source: Ambulatory Visit | Attending: Interventional Cardiology | Admitting: Interventional Cardiology

## 2014-04-30 DIAGNOSIS — I059 Rheumatic mitral valve disease, unspecified: Secondary | ICD-10-CM | POA: Insufficient documentation

## 2014-04-30 DIAGNOSIS — Z952 Presence of prosthetic heart valve: Secondary | ICD-10-CM | POA: Insufficient documentation

## 2014-04-30 NOTE — Progress Notes (Signed)
Cardiac Rehab Medication Review by a Pharmacist  Does the patient  feel that his/her medications are working for him/her?  yes  Has the patient been experiencing any side effects to the medications prescribed?  no  Does the patient measure his/her own blood pressure or blood glucose at home?  No. BP this am was 120/85   Does the patient have any problems obtaining medications due to transportation or finances?   no  Understanding of regimen: good Understanding of indications: fair Potential of compliance: good  Pharmacist comments: Pt reports taking medications as prescribed.Pt reports not taking any percocet anymore and only Tylenol as needed for chest pain from open heart surgery. Pt also reports being told to take lopressor for only a week after the surgery and then to stop. Pt reports good understanding of medications and has no further questions or concerns.  Armandina StammerBATCHELDER,Theordore Cisnero J 04/30/2014 8:29 AM

## 2014-05-01 ENCOUNTER — Ambulatory Visit: Payer: 59 | Admitting: Internal Medicine

## 2014-05-04 ENCOUNTER — Encounter (HOSPITAL_COMMUNITY)
Admission: RE | Admit: 2014-05-04 | Discharge: 2014-05-04 | Disposition: A | Discharge status: Home / Self Care | Payer: 59 | Source: Ambulatory Visit | Attending: Interventional Cardiology | Admitting: Interventional Cardiology

## 2014-05-04 DIAGNOSIS — Z952 Presence of prosthetic heart valve: Secondary | ICD-10-CM | POA: Diagnosis not present

## 2014-05-04 DIAGNOSIS — I059 Rheumatic mitral valve disease, unspecified: Secondary | ICD-10-CM | POA: Diagnosis present

## 2014-05-04 NOTE — Progress Notes (Signed)
Pt in today for his first day of exercise in cardiac rehab phase II 6:45 class. Pt tolerated light exercise with no complaints.  Monitor showed sr with occasional PVC.  Pt denies any palpitations or complaints.  Medication list reconciled.  Pt verbalizes compliance with medications.  PHQ2 score 0.  Pt feels positive about his recovery and has support from family and friends.  Alanson Alyarlette Harriet Sutphen RN, BSN

## 2014-05-06 ENCOUNTER — Encounter (HOSPITAL_COMMUNITY)
Admission: RE | Admit: 2014-05-06 | Discharge: 2014-05-06 | Disposition: A | Discharge status: Home / Self Care | Payer: 59 | Source: Ambulatory Visit | Attending: Interventional Cardiology | Admitting: Interventional Cardiology

## 2014-05-06 DIAGNOSIS — I059 Rheumatic mitral valve disease, unspecified: Secondary | ICD-10-CM | POA: Diagnosis not present

## 2014-05-07 ENCOUNTER — Encounter (HOSPITAL_COMMUNITY): Payer: Self-pay | Admitting: Interventional Cardiology

## 2014-05-08 ENCOUNTER — Encounter (HOSPITAL_COMMUNITY)
Admission: RE | Admit: 2014-05-08 | Discharge: 2014-05-08 | Disposition: A | Discharge status: Home / Self Care | Payer: 59 | Source: Ambulatory Visit | Attending: Interventional Cardiology | Admitting: Interventional Cardiology

## 2014-05-08 DIAGNOSIS — I059 Rheumatic mitral valve disease, unspecified: Secondary | ICD-10-CM | POA: Diagnosis not present

## 2014-05-11 ENCOUNTER — Encounter (HOSPITAL_COMMUNITY)
Admission: RE | Admit: 2014-05-11 | Discharge: 2014-05-11 | Disposition: A | Discharge status: Home / Self Care | Payer: 59 | Source: Ambulatory Visit | Attending: Interventional Cardiology | Admitting: Interventional Cardiology

## 2014-05-11 DIAGNOSIS — I059 Rheumatic mitral valve disease, unspecified: Secondary | ICD-10-CM | POA: Diagnosis not present

## 2014-05-13 ENCOUNTER — Encounter (HOSPITAL_COMMUNITY)
Admission: RE | Admit: 2014-05-13 | Discharge: 2014-05-13 | Disposition: A | Discharge status: Home / Self Care | Payer: 59 | Source: Ambulatory Visit | Attending: Interventional Cardiology | Admitting: Interventional Cardiology

## 2014-05-13 DIAGNOSIS — I059 Rheumatic mitral valve disease, unspecified: Secondary | ICD-10-CM | POA: Diagnosis not present

## 2014-05-15 ENCOUNTER — Encounter (HOSPITAL_COMMUNITY)
Admission: RE | Admit: 2014-05-15 | Discharge: 2014-05-15 | Disposition: A | Discharge status: Home / Self Care | Payer: 59 | Source: Ambulatory Visit | Attending: Interventional Cardiology | Admitting: Interventional Cardiology

## 2014-05-15 DIAGNOSIS — I059 Rheumatic mitral valve disease, unspecified: Secondary | ICD-10-CM | POA: Diagnosis not present

## 2014-05-15 NOTE — Progress Notes (Signed)
I have reviewed home exercise with Lu DuffelStuart. The patient was advised to walk 2-4 days per week outside of CRP II for 15 minutes, 2 times per day until he can walk 30 minutes continuously.  Pt will also complete one additional day of hand weights outside of CRP II.  Progression of exercise prescription was discussed.  Reviewed THR, pulse, RPE, sign and symptoms and when to call 911 or MD.  Pt voiced understanding. [id number]  Alexia FreestoneKendall Javaria Knapke, MS, ACSM RCEP 05/15/2014 3:23 PM

## 2014-05-18 ENCOUNTER — Encounter (HOSPITAL_COMMUNITY)
Admission: RE | Admit: 2014-05-18 | Discharge: 2014-05-18 | Disposition: A | Discharge status: Home / Self Care | Payer: 59 | Source: Ambulatory Visit | Attending: Interventional Cardiology | Admitting: Interventional Cardiology

## 2014-05-18 DIAGNOSIS — I059 Rheumatic mitral valve disease, unspecified: Secondary | ICD-10-CM | POA: Diagnosis not present

## 2014-05-20 ENCOUNTER — Encounter (HOSPITAL_COMMUNITY)
Admission: RE | Admit: 2014-05-20 | Discharge: 2014-05-20 | Disposition: A | Discharge status: Home / Self Care | Payer: 59 | Source: Ambulatory Visit | Attending: Interventional Cardiology | Admitting: Interventional Cardiology

## 2014-05-20 DIAGNOSIS — I059 Rheumatic mitral valve disease, unspecified: Secondary | ICD-10-CM | POA: Diagnosis not present

## 2014-05-25 ENCOUNTER — Encounter (HOSPITAL_COMMUNITY)
Admission: RE | Admit: 2014-05-25 | Discharge: 2014-05-25 | Disposition: A | Discharge status: Home / Self Care | Payer: 59 | Source: Ambulatory Visit | Attending: Interventional Cardiology | Admitting: Interventional Cardiology

## 2014-05-25 DIAGNOSIS — I059 Rheumatic mitral valve disease, unspecified: Secondary | ICD-10-CM | POA: Diagnosis not present

## 2014-05-27 ENCOUNTER — Encounter (HOSPITAL_COMMUNITY)
Admission: RE | Admit: 2014-05-27 | Discharge: 2014-05-27 | Disposition: A | Discharge status: Home / Self Care | Payer: 59 | Source: Ambulatory Visit | Attending: Interventional Cardiology | Admitting: Interventional Cardiology

## 2014-05-27 DIAGNOSIS — I059 Rheumatic mitral valve disease, unspecified: Secondary | ICD-10-CM | POA: Diagnosis not present

## 2014-05-27 NOTE — Progress Notes (Signed)
Pt elected to discharge early due to insurance changes in the new year.  Pt completed  10 exercise sessions in cardiac rehab phase II.  Pt made excellent progress as evident by his increased MET level from 3.0 to 3.8.  Pt attended exercise classes that was offered during his short participation in cardiac rehab. Medication list reconciled.  Pt verbalizes compliance with medical therapy.  Repeat PHQ2 score 0.  Pt demonstrates appropriate coping skills and has supportive family and friends.   Short term goal is to get back to walking which patient has successfully incorporated in his regimen.  Long term goal partially met due to the short time of participation.  It was a pleasure to work with this patient in cardiac rehab. Cherre Huger, BSN

## 2014-06-01 ENCOUNTER — Encounter (HOSPITAL_COMMUNITY): Payer: 59

## 2014-06-03 ENCOUNTER — Encounter (HOSPITAL_COMMUNITY): Payer: 59

## 2014-06-05 ENCOUNTER — Encounter (HOSPITAL_COMMUNITY): Payer: 59

## 2014-06-08 ENCOUNTER — Encounter (HOSPITAL_COMMUNITY): Payer: 59

## 2014-06-10 ENCOUNTER — Encounter (HOSPITAL_COMMUNITY): Payer: 59

## 2014-06-12 ENCOUNTER — Encounter (HOSPITAL_COMMUNITY): Payer: 59

## 2014-06-15 ENCOUNTER — Encounter (HOSPITAL_COMMUNITY): Payer: 59

## 2014-06-17 ENCOUNTER — Encounter (HOSPITAL_COMMUNITY): Payer: 59

## 2014-06-19 ENCOUNTER — Encounter (HOSPITAL_COMMUNITY): Payer: 59

## 2014-06-22 ENCOUNTER — Encounter (HOSPITAL_COMMUNITY): Payer: 59

## 2014-06-24 ENCOUNTER — Encounter (HOSPITAL_COMMUNITY): Payer: 59

## 2014-06-26 ENCOUNTER — Encounter (HOSPITAL_COMMUNITY): Payer: 59

## 2014-06-29 ENCOUNTER — Encounter (HOSPITAL_COMMUNITY): Payer: 59

## 2014-07-01 ENCOUNTER — Encounter (HOSPITAL_COMMUNITY): Payer: 59

## 2014-07-03 ENCOUNTER — Encounter (HOSPITAL_COMMUNITY): Payer: 59

## 2014-07-06 ENCOUNTER — Encounter (HOSPITAL_COMMUNITY): Payer: 59

## 2014-07-08 ENCOUNTER — Ambulatory Visit (INDEPENDENT_AMBULATORY_CARE_PROVIDER_SITE_OTHER): Payer: 59 | Admitting: Interventional Cardiology

## 2014-07-08 ENCOUNTER — Encounter (HOSPITAL_COMMUNITY): Payer: 59

## 2014-07-08 ENCOUNTER — Encounter: Payer: Self-pay | Admitting: Interventional Cardiology

## 2014-07-08 VITALS — BP 108/72 | HR 80 | Ht 72.0 in | Wt 180.0 lb

## 2014-07-08 DIAGNOSIS — I340 Nonrheumatic mitral (valve) insufficiency: Secondary | ICD-10-CM

## 2014-07-08 MED ORDER — ASPIRIN EC 81 MG PO TBEC: 81.0000 mg | DELAYED_RELEASE_TABLET | Freq: Every day | ORAL | Status: DC

## 2014-07-08 NOTE — Patient Instructions (Signed)
Your physician has recommended you make the following change in your medication:  1) START taking Aspirin 81mg  daily  Your physician wants you to follow-up in: 1 year with Dr. Eldridge DaceVaranasi. You will receive a reminder letter in the mail two months in advance. If you don't receive a letter, please call our office to schedule the follow-up appointment.

## 2014-07-08 NOTE — Progress Notes (Signed)
Patient ID: Cody Barnes, male   DOB: June 24, 1960, 54 y.o.   MRN: [id number] Patient ID: Cody Barnes, male   DOB: [birth year], 55 y.o.   MRN: [id number]    [address] [place], [place]  [address] Phone: [phone] Fax:  [phone]  Date:  07/08/2014   ID:  Avelardo Reesman, DOB 06/30/1960, MRN [id number]  PCP:  Sanda Linger, MD      History of Present Illness: Cody Barnes is a 54 y.o. male who has had mitral valve prolapse for many years. He was followed with serial echoes. He was noted to have increased LV size at the time of his last echocardiogram in 2015 with some symptoms of dyspnea on exertion despite preserved LVEF. He was referred for surgery. He chose to go to [facility] clinic for his operation. He underwent mitral valve repair and aortic valve replacement due to aortic insufficiency as well. He chose to have a bioprosthetic valve. Cardiac cath prior to the valve surgery was negative for significant coronary artery disease.  He has done well since his surgery. He has returned to regular exercise 4-5 days 45-60 minutes at a time.  Overall, he feels that he is doing well. No further pain in his chest at the surgical site. He is back to driving and working full time. He wants to know when he can sleep on his stomach. He did a month of cardiac rehabilitation. He did have an echocardiogram after the surgery at [facility].  Sleeping is back to normal.   He has some occasional left arm numbness that lasts 30 seconds at a time.  He has some dizziness at random times, lasting 1-2 minutes.  It can occur while sitting.  Not always related to change in position. No palpitations. No syncope. Never happened during exercise.      Wt Readings from Last 3 Encounters:  07/08/14 180 lb (81.647 kg)  04/30/14 173 lb 15.1 oz (78.9 kg)  04/29/14 174 lb (78.926 kg)     Past Medical History  Diagnosis Date  . Heart murmur   . Mitral valve prolapse   . Mitral regurgitation    . Aortic insufficiency     No current outpatient prescriptions on file.   No current facility-administered medications for this visit.    Allergies:   No Known Allergies  Social History:  The patient  reports that he has never smoked. He has never used smokeless tobacco. He reports that he drinks about 3.0 oz of alcohol per week. He reports that he does not use illicit drugs.   Family History:  The patient's family history includes Cancer in his mother. There is no history of Drug abuse, Early death, Heart disease, Hyperlipidemia, Hypertension, Kidney disease, Stroke, or Arthritis.   ROS:  Please see the history of present illness.  No nausea, vomiting.  No fevers, chills.  No focal weakness.  No dysuria.    All other systems reviewed and negative.   PHYSICAL EXAM: VS:  BP 108/72 mmHg  Pulse 80  Ht 6' (1.829 m)  Wt 180 lb (81.647 kg)  BMI 24.41 kg/m2 General: Well developed, well nourished, in no acute distress HEENT: normal Neck: no JVD, no carotid bruits Cardiac:  normal S1,; RRR; early systolic murmur, split S2; well healed scar on chest Lungs:  clear to auscultation bilaterally, no wheezing, rhonchi or rales Abd: soft, nontender, no hepatomegaly Ext: no edema Skin: warm and dry Neuro:   no focal abnormalities noted Psych: normal  affect    ASSESSMENT AND PLAN:  1. Mitral valve disease/aortic valve replacement: Status post mitral valve repair. He needs SBE prophylaxis. No signs of heart failure. We'll try to obtain CD of echocardiogram post surgery.  Finished cardiac rehabilitation. [place] [facility] [place].   Back to sleeping on his stomach.  He was not given anticoagulation postoperatively. Continue aspirin 81 mg daily. I will see him back in 1 year.    Signed, Fredric MareJay S. Inanna Telford, MD, [facility] 07/08/2014 9:03 AM

## 2014-07-10 ENCOUNTER — Encounter (HOSPITAL_COMMUNITY): Payer: 59

## 2014-07-13 ENCOUNTER — Encounter (HOSPITAL_COMMUNITY): Payer: 59

## 2014-07-15 ENCOUNTER — Encounter (HOSPITAL_COMMUNITY): Payer: 59

## 2014-07-17 ENCOUNTER — Encounter (HOSPITAL_COMMUNITY): Payer: 59

## 2014-07-20 ENCOUNTER — Encounter (HOSPITAL_COMMUNITY): Payer: 59

## 2014-07-22 ENCOUNTER — Encounter (HOSPITAL_COMMUNITY): Payer: 59

## 2014-07-24 ENCOUNTER — Encounter (HOSPITAL_COMMUNITY): Payer: 59

## 2014-07-27 ENCOUNTER — Encounter (HOSPITAL_COMMUNITY): Payer: 59

## 2014-07-29 ENCOUNTER — Ambulatory Visit (INDEPENDENT_AMBULATORY_CARE_PROVIDER_SITE_OTHER): Payer: 59 | Admitting: Internal Medicine

## 2014-07-29 ENCOUNTER — Ambulatory Visit: Payer: 59 | Admitting: Internal Medicine

## 2014-07-29 ENCOUNTER — Encounter: Payer: Self-pay | Admitting: Internal Medicine

## 2014-07-29 ENCOUNTER — Encounter (HOSPITAL_COMMUNITY): Payer: 59

## 2014-07-29 ENCOUNTER — Other Ambulatory Visit (INDEPENDENT_AMBULATORY_CARE_PROVIDER_SITE_OTHER): Payer: 59

## 2014-07-29 VITALS — BP 118/78 | HR 67 | Temp 97.7°F | Resp 16 | Wt 179.0 lb

## 2014-07-29 DIAGNOSIS — R972 Elevated prostate specific antigen [PSA]: Secondary | ICD-10-CM

## 2014-07-29 DIAGNOSIS — D509 Iron deficiency anemia, unspecified: Secondary | ICD-10-CM | POA: Diagnosis not present

## 2014-07-29 LAB — CBC WITH DIFFERENTIAL/PLATELET
Basophils Absolute: 0 10*3/uL (ref 0.0–0.1)
Basophils Relative: 0.7 % (ref 0.0–3.0)
Eosinophils Absolute: 0.2 10*3/uL (ref 0.0–0.7)
Eosinophils Relative: 3.1 % (ref 0.0–5.0)
HCT: 44.9 % (ref 39.0–52.0)
Hemoglobin: 15.2 g/dL (ref 13.0–17.0)
Lymphocytes Relative: 26.6 % (ref 12.0–46.0)
Lymphs Abs: 1.6 10*3/uL (ref 0.7–4.0)
MCHC: 33.8 g/dL (ref 30.0–36.0)
MCV: 81 fl (ref 78.0–100.0)
Monocytes Absolute: 0.8 10*3/uL (ref 0.1–1.0)
Monocytes Relative: 13.3 % — ABNORMAL HIGH (ref 3.0–12.0)
Neutro Abs: 3.4 10*3/uL (ref 1.4–7.7)
Neutrophils Relative %: 56.3 % (ref 43.0–77.0)
Platelets: 195 10*3/uL (ref 150.0–400.0)
RBC: 5.54 Mil/uL (ref 4.22–5.81)
RDW: 16.9 % — ABNORMAL HIGH (ref 11.5–15.5)
WBC: 6 10*3/uL (ref 4.0–10.5)

## 2014-07-29 LAB — URINALYSIS, ROUTINE W REFLEX MICROSCOPIC
Bilirubin Urine: NEGATIVE
Hgb urine dipstick: NEGATIVE
Ketones, ur: NEGATIVE
Leukocytes, UA: NEGATIVE
Nitrite: NEGATIVE
RBC / HPF: NONE SEEN (ref 0–?)
Specific Gravity, Urine: 1.025 (ref 1.000–1.030)
Total Protein, Urine: NEGATIVE
Urine Glucose: NEGATIVE
Urobilinogen, UA: 0.2 (ref 0.0–1.0)
WBC, UA: NONE SEEN (ref 0–?)
pH: 6 (ref 5.0–8.0)

## 2014-07-29 LAB — IBC PANEL
Iron: 84 ug/dL (ref 42–165)
Saturation Ratios: 23 % (ref 20.0–50.0)
Transferrin: 261 mg/dL (ref 212.0–360.0)

## 2014-07-29 LAB — PSA: PSA: 4.35 ng/mL — ABNORMAL HIGH (ref 0.10–4.00)

## 2014-07-29 LAB — FERRITIN: Ferritin: 36.5 ng/mL (ref 22.0–322.0)

## 2014-07-29 NOTE — Assessment & Plan Note (Signed)
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

## 2014-07-29 NOTE — Patient Instructions (Signed)
Iron Deficiency Anemia Anemia is a condition in which there are less red blood cells or hemoglobin in the blood than normal. Hemoglobin is the part of red blood cells that carries oxygen. Iron deficiency anemia is anemia caused by too little iron. It is the most common type of anemia. It may leave you tired and short of breath. CAUSES   Lack of iron in the diet.  Poor absorption of iron, as seen with intestinal disorders.  Intestinal bleeding.  Heavy periods. SIGNS AND SYMPTOMS  Mild anemia may not be noticeable. Symptoms may include:  Fatigue.  Headache.  Pale skin.  Weakness.  Tiredness.  Shortness of breath.  Dizziness.  Cold hands and feet.  Fast or irregular heartbeat. DIAGNOSIS  Diagnosis requires a thorough evaluation and physical exam by your health care provider. Blood tests are generally used to confirm iron deficiency anemia. Additional tests may be done to find the underlying cause of your anemia. These may include:  Testing for blood in the stool (fecal occult blood test).  A procedure to see inside the colon and rectum (colonoscopy).  A procedure to see inside the esophagus and stomach (endoscopy). TREATMENT  Iron deficiency anemia is treated by correcting the cause of the deficiency. Treatment may involve:  Adding iron-rich foods to your diet.  Taking iron supplements. Pregnant or breastfeeding women need to take extra iron because their normal diet usually does not provide the required amount.  Taking vitamins. Vitamin C improves the absorption of iron. Your health care provider may recommend that you take your iron tablets with a glass of orange juice or vitamin C supplement.  Medicines to make heavy menstrual flow lighter.  Surgery. HOME CARE INSTRUCTIONS   Take iron as directed by your health care provider.  If you cannot tolerate taking iron supplements by mouth, talk to your health care provider about taking them through a vein  (intravenously) or an injection into a muscle.  For the best iron absorption, iron supplements should be taken on an empty stomach. If you cannot tolerate them on an empty stomach, you may need to take them with food.  Do not drink milk or take antacids at the same time as your iron supplements. Milk and antacids may interfere with the absorption of iron.  Iron supplements can cause constipation. Make sure to include fiber in your diet to prevent constipation. A stool softener may also be recommended.  Take vitamins as directed by your health care provider.  Eat a diet rich in iron. Foods high in iron include liver, lean beef, whole-grain bread, eggs, dried fruit, and dark green leafy vegetables. SEEK IMMEDIATE MEDICAL CARE IF:   You faint. If this happens, do not drive. Call your local emergency services (911 in [place] if no other help is available.  You have chest pain.  You feel nauseous or vomit.  You have severe or increased shortness of breath with activity.  You feel weak.  You have a rapid heartbeat.  You have unexplained sweating.  You become light-headed when getting up from a chair or bed. MAKE SURE YOU:   Understand these instructions.  Will watch your condition.  Will get help right away if you are not doing well or get worse. Document Released: 05/12/2000 Document Revised: 05/20/2013 Document Reviewed: 01/20/2013 ExitCare Patient Information 2015 [employer]. This information is not intended to replace advice given to you by your health care provider. Make sure you discuss any questions you have with your health care provider.  

## 2014-07-29 NOTE — Progress Notes (Signed)
   Subjective:    Patient ID: Cody Barnes, male    DOB: 1960-08-02, 54 y.o.   MRN: [id number]  Anemia Presents for follow-up visit. Symptoms include malaise/fatigue. There has been no abdominal pain, anorexia, bruising/bleeding easily, confusion, fever, leg swelling, light-headedness, pallor, palpitations, paresthesias, pica or weight loss. Signs of blood loss that are not present include hematemesis, hematochezia and melena. Past treatments include oral iron supplements. Past medical history includes recent surgery.      Review of Systems  Constitutional: Positive for malaise/fatigue. Negative for fever, chills, weight loss, diaphoresis, appetite change and fatigue.  HENT: Negative.   Eyes: Negative.   Respiratory: Negative.  Negative for cough, choking, chest tightness, shortness of breath and stridor.   Cardiovascular: Negative.  Negative for chest pain, palpitations and leg swelling.  Gastrointestinal: Negative.  Negative for nausea, vomiting, abdominal pain, diarrhea, constipation, blood in stool, melena, hematochezia, anorexia and hematemesis.  Endocrine: Negative.   Genitourinary: Negative.  Negative for dysuria, urgency, frequency, hematuria, flank pain, decreased urine volume and difficulty urinating.  Musculoskeletal: Negative.   Skin: Negative.  Negative for pallor.  Allergic/Immunologic: Negative.   Neurological: Negative.  Negative for light-headedness and paresthesias.  Hematological: Negative.  Negative for adenopathy. Does not bruise/bleed easily.  Psychiatric/Behavioral: Negative.  Negative for confusion.       Objective:   Physical Exam  Constitutional: He is oriented to person, place, and time. He appears well-developed and well-nourished. No distress.  HENT:  Head: Normocephalic and atraumatic.  Mouth/Throat: Oropharynx is clear and moist. No oropharyngeal exudate.  Eyes: Conjunctivae are normal. Right eye exhibits no discharge. Left eye exhibits no discharge.  No scleral icterus.  Neck: Normal range of motion. Neck supple. No JVD present. No tracheal deviation present. No thyromegaly present.  Cardiovascular: Normal rate, regular rhythm, S1 normal, S2 normal and intact distal pulses.  Exam reveals no gallop and no friction rub.   Murmur heard.  Systolic murmur is present with a grade of 1/6   Diastolic murmur is present with a grade of 1/6  Pulmonary/Chest: Effort normal and breath sounds normal. No stridor. No respiratory distress. He has no wheezes. He has no rales. He exhibits no tenderness.  Abdominal: Soft. Bowel sounds are normal. He exhibits no distension and no mass. There is no tenderness. There is no rebound and no guarding.  Musculoskeletal: Normal range of motion. He exhibits no edema or tenderness.  Lymphadenopathy:    He has no cervical adenopathy.  Neurological: He is oriented to person, place, and time.  Skin: Skin is warm and dry. No rash noted. He is not diaphoretic. No erythema. No pallor.  Psychiatric: He has a normal mood and affect. His behavior is normal. Judgment and thought content normal.  Vitals reviewed.    Lab Results  Component Value Date   WBC 9.1 04/13/2014   HGB 11.0* 04/13/2014   HCT 33.6* 04/13/2014   PLT 465.0* 04/13/2014   GLUCOSE 79 04/13/2014   CHOL 197 10/02/2013   TRIG 70.0 10/02/2013   HDL 46.80 10/02/2013   LDLCALC 136* 10/02/2013   ALT 39 04/13/2014   AST 20 04/13/2014   NA 138 04/13/2014   K 4.6 04/13/2014   CL 103 04/13/2014   CREATININE 1.0 04/13/2014   BUN 18 04/13/2014   CO2 27 04/13/2014   TSH 1.19 04/13/2014   PSA 12.51* 04/13/2014   INR 1.0 12/10/2013       Assessment & Plan:

## 2014-07-29 NOTE — Assessment & Plan Note (Signed)
CBC and iron levels are normal now

## 2014-07-29 NOTE — Progress Notes (Signed)
Pre visit review using our clinic review tool, if applicable. No additional management support is needed unless otherwise documented below in the visit note. 

## 2014-07-31 ENCOUNTER — Encounter (HOSPITAL_COMMUNITY): Payer: 59

## 2014-08-03 ENCOUNTER — Encounter (HOSPITAL_COMMUNITY): Payer: 59

## 2014-08-05 ENCOUNTER — Encounter (HOSPITAL_COMMUNITY): Payer: 59

## 2014-08-07 ENCOUNTER — Encounter (HOSPITAL_COMMUNITY): Payer: 59

## 2014-10-12 ENCOUNTER — Other Ambulatory Visit (INDEPENDENT_AMBULATORY_CARE_PROVIDER_SITE_OTHER): Payer: 59

## 2014-10-12 ENCOUNTER — Encounter: Payer: Self-pay | Admitting: Internal Medicine

## 2014-10-12 ENCOUNTER — Ambulatory Visit (INDEPENDENT_AMBULATORY_CARE_PROVIDER_SITE_OTHER): Payer: 59 | Admitting: Internal Medicine

## 2014-10-12 VITALS — BP 114/78 | HR 73 | Temp 98.4°F | Resp 16 | Ht 72.0 in | Wt 184.0 lb

## 2014-10-12 DIAGNOSIS — R972 Elevated prostate specific antigen [PSA]: Secondary | ICD-10-CM

## 2014-10-12 DIAGNOSIS — Z0000 Encounter for general adult medical examination without abnormal findings: Secondary | ICD-10-CM | POA: Diagnosis not present

## 2014-10-12 DIAGNOSIS — B3749 Other urogenital candidiasis: Secondary | ICD-10-CM | POA: Diagnosis not present

## 2014-10-12 HISTORY — DX: Elevated prostate specific antigen (PSA): R97.20

## 2014-10-12 LAB — CBC WITH DIFFERENTIAL/PLATELET
Basophils Absolute: 0 10*3/uL (ref 0.0–0.1)
Basophils Relative: 0.6 % (ref 0.0–3.0)
Eosinophils Absolute: 0.2 10*3/uL (ref 0.0–0.7)
Eosinophils Relative: 2.6 % (ref 0.0–5.0)
HCT: 44.8 % (ref 39.0–52.0)
Hemoglobin: 15.3 g/dL (ref 13.0–17.0)
Lymphocytes Relative: 22.2 % (ref 12.0–46.0)
Lymphs Abs: 1.3 10*3/uL (ref 0.7–4.0)
MCHC: 34.1 g/dL (ref 30.0–36.0)
MCV: 86 fl (ref 78.0–100.0)
Monocytes Absolute: 0.5 10*3/uL (ref 0.1–1.0)
Monocytes Relative: 8.6 % (ref 3.0–12.0)
Neutro Abs: 3.9 10*3/uL (ref 1.4–7.7)
Neutrophils Relative %: 66 % (ref 43.0–77.0)
Platelets: 194 10*3/uL (ref 150.0–400.0)
RBC: 5.21 Mil/uL (ref 4.22–5.81)
RDW: 13.9 % (ref 11.5–15.5)
WBC: 5.9 10*3/uL (ref 4.0–10.5)

## 2014-10-12 LAB — URINALYSIS, ROUTINE W REFLEX MICROSCOPIC
Bilirubin Urine: NEGATIVE
Hgb urine dipstick: NEGATIVE
Ketones, ur: NEGATIVE
Leukocytes, UA: NEGATIVE
Nitrite: NEGATIVE
Specific Gravity, Urine: 1.02 (ref 1.000–1.030)
Total Protein, Urine: NEGATIVE
Urine Glucose: NEGATIVE
Urobilinogen, UA: 0.2 (ref 0.0–1.0)
pH: 6 (ref 5.0–8.0)

## 2014-10-12 LAB — COMPREHENSIVE METABOLIC PANEL
ALT: 51 U/L (ref 0–53)
AST: 29 U/L (ref 0–37)
Albumin: 4.3 g/dL (ref 3.5–5.2)
Alkaline Phosphatase: 86 U/L (ref 39–117)
BUN: 17 mg/dL (ref 6–23)
CO2: 30 mEq/L (ref 19–32)
Calcium: 9.7 mg/dL (ref 8.4–10.5)
Chloride: 102 mEq/L (ref 96–112)
Creatinine, Ser: 1.21 mg/dL (ref 0.40–1.50)
GFR: 66.31 mL/min (ref >60.00)
Glucose, Bld: 98 mg/dL (ref 70–99)
Potassium: 4.7 mEq/L (ref 3.5–5.1)
Sodium: 137 mEq/L (ref 135–145)
Total Bilirubin: 0.5 mg/dL (ref 0.2–1.2)
Total Protein: 6.8 g/dL (ref 6.0–8.3)

## 2014-10-12 LAB — LIPID PANEL
Cholesterol: 199 mg/dL (ref 0–200)
HDL: 47.3 mg/dL (ref >39.00)
LDL Cholesterol: 127 mg/dL — ABNORMAL HIGH (ref 0–99)
NonHDL: 151.7
Total CHOL/HDL Ratio: 4
Triglycerides: 125 mg/dL (ref 0.0–149.0)
VLDL: 25 mg/dL (ref 0.0–40.0)

## 2014-10-12 LAB — TSH: TSH: 1.09 u[IU]/mL (ref 0.35–4.50)

## 2014-10-12 LAB — PSA: PSA: 5.45 ng/mL — ABNORMAL HIGH (ref 0.10–4.00)

## 2014-10-12 MED ORDER — CICLOPIROX OLAMINE 0.77 % EX CREA: TOPICAL_CREAM | Freq: Two times a day (BID) | CUTANEOUS | Status: DC

## 2014-10-12 NOTE — Addendum Note (Signed)
Addended by: Etta GrandchildJONES, Ricarda Atayde L on: 10/12/2014 04:32 PM   Modules accepted: Kipp BroodSmartSet

## 2014-10-12 NOTE — Patient Instructions (Signed)

## 2014-10-12 NOTE — Progress Notes (Addendum)
Subjective:  Patient ID: Cody MuseStuart Barnes, male    DOB: 22-Mar-1961  Age: 54 y.o. MRN: [id number]  CC: Annual Exam   HPI Cody Barnes presents for an annual exam but he also complains of rash and itching around his anus.  Outpatient Prescriptions Prior to Visit  Medication Sig Dispense Refill  . aspirin EC 81 MG tablet Take 1 tablet (81 mg total) by mouth daily. (Patient not taking: Reported on 10/12/2014)     No facility-administered medications prior to visit.    ROS Review of Systems  Constitutional: Negative for fever, chills, diaphoresis, appetite change and fatigue.  HENT: Negative.   Eyes: Negative.   Respiratory: Negative.  Negative for cough, choking, chest tightness, shortness of breath, wheezing and stridor.   Cardiovascular: Negative.  Negative for chest pain, palpitations and leg swelling.  Gastrointestinal: Negative.  Negative for nausea, vomiting, abdominal pain, diarrhea, constipation and blood in stool.  Endocrine: Negative.   Genitourinary: Negative.  Negative for dysuria, urgency, hematuria, decreased urine volume and difficulty urinating.  Musculoskeletal: Negative.   Skin: Positive for rash.  Allergic/Immunologic: Negative.   Neurological: Negative.   Hematological: Negative.  Negative for adenopathy. Does not bruise/bleed easily.  Psychiatric/Behavioral: Negative.     Objective:  BP 114/78 mmHg  Pulse 73  Temp(Src) 98.4 F (36.9 C) (Oral)  Resp 16  Ht 6' (1.829 m)  Wt 184 lb (83.462 kg)  BMI 24.95 kg/m2  SpO2 96%  BP Readings from Last 3 Encounters:  10/12/14 114/78  07/29/14 118/78  07/08/14 108/72    Wt Readings from Last 3 Encounters:  10/12/14 184 lb (83.462 kg)  07/29/14 179 lb (81.194 kg)  07/08/14 180 lb (81.647 kg)    Physical Exam  Constitutional: He is oriented to person, place, and time. He appears well-developed and well-nourished. No distress.  HENT:  Head: Normocephalic and atraumatic.  Mouth/Throat: Oropharynx is clear  and moist. No oropharyngeal exudate.  Eyes: Conjunctivae are normal. Right eye exhibits no discharge. Left eye exhibits no discharge. No scleral icterus.  Neck: Normal range of motion. Neck supple. No JVD present. No tracheal deviation present. No thyromegaly present.  Cardiovascular: Normal rate, regular rhythm, S1 normal, S2 normal and intact distal pulses.  Exam reveals no gallop, no S3, no S4 and no friction rub.   Murmur heard.  Systolic murmur is present with a grade of 2/6   No diastolic murmur is present  Pulmonary/Chest: Effort normal and breath sounds normal. No stridor. No respiratory distress. He has no wheezes. He has no rales. He exhibits no tenderness.  Abdominal: Soft. Bowel sounds are normal. He exhibits no distension and no mass. There is no tenderness. There is no rebound and no guarding. Hernia confirmed negative in the right inguinal area and confirmed negative in the left inguinal area.  Genitourinary: Rectum normal and penis normal. Rectal exam shows no external hemorrhoid, no internal hemorrhoid, no fissure, no mass, no tenderness and anal tone normal. Guaiac negative stool. Prostate is enlarged. Prostate is not tender. Right testis shows no mass, no swelling and no tenderness. Right testis is descended. Left testis shows no swelling and no tenderness. Left testis is descended. Circumcised. No penile erythema or penile tenderness. No discharge found.  Circumferentially around the anus there is erythema and scale  Musculoskeletal: Normal range of motion. He exhibits no edema or tenderness.  Lymphadenopathy:    He has no cervical adenopathy.       Right: No inguinal adenopathy present.  Left: No inguinal adenopathy present.  Neurological: He is oriented to person, place, and time.  Skin: Skin is warm and dry. Rash noted. He is not diaphoretic. No erythema. No pallor.  Psychiatric: He has a normal mood and affect. His behavior is normal. Judgment and thought content  normal.  Vitals reviewed.   Lab Results  Component Value Date   WBC 5.9 10/12/2014   HGB 15.3 10/12/2014   HCT 44.8 10/12/2014   PLT 194.0 10/12/2014   GLUCOSE 98 10/12/2014   CHOL 199 10/12/2014   TRIG 125.0 10/12/2014   HDL 47.30 10/12/2014   LDLCALC 127* 10/12/2014   ALT 51 10/12/2014   AST 29 10/12/2014   NA 137 10/12/2014   K 4.7 10/12/2014   CL 102 10/12/2014   CREATININE 1.21 10/12/2014   BUN 17 10/12/2014   CO2 30 10/12/2014   TSH 1.09 10/12/2014   PSA 5.45* 10/12/2014   INR 1.0 12/10/2013    No results found.  Assessment & Plan:   Cody Barnes was seen today for annual exam.  Diagnoses and all orders for this visit:  Routine general medical examination at a health care facility Orders: -     Lipid panel; Future -     Comprehensive metabolic panel; Future -     CBC with Differential/Platelet; Future -     Urinalysis, Routine w reflex microscopic; Future -     TSH; Future -     PSA; Future  Candida infection of genital region Orders: -     ciclopirox (LOPROX) 0.77 % cream; Apply topically 2 (two) times daily.  Elevated PSA Orders: -     Ambulatory referral to Urology   I have discontinued Cody Barnes's aspirin EC. I am also having him start on ciclopirox.  Meds ordered this encounter  Medications  . ciclopirox (LOPROX) 0.77 % cream    Sig: Apply topically 2 (two) times daily.    Dispense:  90 g    Refill:  1   Comments:   Follow-up: Return in about 4 months (around 02/12/2015).  Sanda Lingerhomas Jones, MD

## 2014-10-12 NOTE — Progress Notes (Signed)
Pre visit review using our clinic review tool, if applicable. No additional management support is needed unless otherwise documented below in the visit note. 

## 2014-10-18 ENCOUNTER — Encounter (HOSPITAL_COMMUNITY): Payer: Self-pay | Admitting: Emergency Medicine

## 2014-10-18 ENCOUNTER — Emergency Department (HOSPITAL_COMMUNITY)
Admission: EM | Admit: 2014-10-18 | Discharge: 2014-10-18 | Disposition: A | Discharge status: Home / Self Care | Payer: 59 | Attending: Emergency Medicine | Admitting: Emergency Medicine

## 2014-10-18 ENCOUNTER — Emergency Department (HOSPITAL_COMMUNITY): Payer: 59

## 2014-10-18 DIAGNOSIS — Z9889 Other specified postprocedural states: Secondary | ICD-10-CM | POA: Diagnosis not present

## 2014-10-18 DIAGNOSIS — Z79899 Other long term (current) drug therapy: Secondary | ICD-10-CM | POA: Diagnosis not present

## 2014-10-18 DIAGNOSIS — Z8679 Personal history of other diseases of the circulatory system: Secondary | ICD-10-CM | POA: Insufficient documentation

## 2014-10-18 DIAGNOSIS — R011 Cardiac murmur, unspecified: Secondary | ICD-10-CM | POA: Insufficient documentation

## 2014-10-18 DIAGNOSIS — R109 Unspecified abdominal pain: Secondary | ICD-10-CM | POA: Insufficient documentation

## 2014-10-18 LAB — CBC WITH DIFFERENTIAL/PLATELET
Basophils Absolute: 0 10*3/uL (ref 0.0–0.1)
Basophils Relative: 1 % (ref 0–1)
Eosinophils Absolute: 0.3 10*3/uL (ref 0.0–0.7)
Eosinophils Relative: 4 % (ref 0–5)
HCT: 45.8 % (ref 39.0–52.0)
Hemoglobin: 15.4 g/dL (ref 13.0–17.0)
Lymphocytes Relative: 22 % (ref 12–46)
Lymphs Abs: 1.8 10*3/uL (ref 0.7–4.0)
MCH: 29.7 pg (ref 26.0–34.0)
MCHC: 33.6 g/dL (ref 30.0–36.0)
MCV: 88.4 fL (ref 78.0–100.0)
Monocytes Absolute: 0.8 10*3/uL (ref 0.1–1.0)
Monocytes Relative: 10 % (ref 3–12)
Neutro Abs: 5 10*3/uL (ref 1.7–7.7)
Neutrophils Relative %: 63 % (ref 43–77)
Platelets: 189 10*3/uL (ref 150–400)
RBC: 5.18 MIL/uL (ref 4.22–5.81)
RDW: 13.2 % (ref 11.5–15.5)
WBC: 7.9 10*3/uL (ref 4.0–10.5)

## 2014-10-18 LAB — COMPREHENSIVE METABOLIC PANEL
ALT: 53 U/L (ref 17–63)
AST: 37 U/L (ref 15–41)
Albumin: 4.5 g/dL (ref 3.5–5.0)
Alkaline Phosphatase: 84 U/L (ref 38–126)
Anion gap: 11 (ref 5–15)
BUN: 18 mg/dL (ref 6–20)
CO2: 23 mmol/L (ref 22–32)
Calcium: 9.4 mg/dL (ref 8.9–10.3)
Chloride: 104 mmol/L (ref 101–111)
Creatinine, Ser: 1.09 mg/dL (ref 0.61–1.24)
GFR calc Af Amer: >60 mL/min (ref >60)
GFR calc non Af Amer: >60 mL/min (ref >60)
Glucose, Bld: 106 mg/dL — ABNORMAL HIGH (ref 65–99)
Potassium: 4.3 mmol/L (ref 3.5–5.1)
Sodium: 138 mmol/L (ref 135–145)
Total Bilirubin: 0.6 mg/dL (ref 0.3–1.2)
Total Protein: 7.4 g/dL (ref 6.5–8.1)

## 2014-10-18 LAB — URINALYSIS, ROUTINE W REFLEX MICROSCOPIC
Bilirubin Urine: NEGATIVE
Glucose, UA: NEGATIVE mg/dL
Hgb urine dipstick: NEGATIVE
Ketones, ur: NEGATIVE mg/dL
Leukocytes, UA: NEGATIVE
Nitrite: NEGATIVE
Protein, ur: NEGATIVE mg/dL
Specific Gravity, Urine: 1.022 (ref 1.005–1.030)
Urobilinogen, UA: 0.2 mg/dL (ref 0.0–1.0)
pH: 5.5 (ref 5.0–8.0)

## 2014-10-18 LAB — POC URINE PREG, ED: Preg Test, Ur: NEGATIVE

## 2014-10-18 LAB — LIPASE, BLOOD: Lipase: 26 U/L (ref 22–51)

## 2014-10-18 MED ORDER — ONDANSETRON HCL 4 MG/2ML IJ SOLN: 4.0000 mg | Freq: Once | INTRAMUSCULAR | Status: DC

## 2014-10-18 MED ORDER — DOCUSATE SODIUM 100 MG PO CAPS: 100.0000 mg | ORAL_CAPSULE | Freq: Every day | ORAL | Status: DC | PRN

## 2014-10-18 MED ORDER — OXYCODONE-ACETAMINOPHEN 5-325 MG PO TABS: 1.0000 | ORAL_TABLET | Freq: Four times a day (QID) | ORAL | Status: DC | PRN

## 2014-10-18 MED ORDER — HYDROMORPHONE HCL 1 MG/ML IJ SOLN
0.5000 mg | Freq: Once | INTRAMUSCULAR | Status: AC
  Administered 2014-10-18: 0.5 mg via INTRAVENOUS
  Filled 2014-10-18: qty 1

## 2014-10-18 MED ORDER — HYDROMORPHONE HCL 1 MG/ML IJ SOLN
1.0000 mg | INTRAMUSCULAR | Status: AC
  Administered 2014-10-18: 1 mg via INTRAVENOUS
  Filled 2014-10-18: qty 1

## 2014-10-18 MED ORDER — SODIUM CHLORIDE 0.9 % IV BOLUS (SEPSIS)
500.0000 mL | Freq: Once | INTRAVENOUS | Status: AC
Start: 2014-10-18 — End: 2014-10-18
  Administered 2014-10-18: 500 mL via INTRAVENOUS

## 2014-10-18 NOTE — ED Notes (Signed)
Patient able to void without catheterization.

## 2014-10-18 NOTE — ED Provider Notes (Signed)
CSN: [id number]     Arrival date & time 10/18/14  0751 History   First MD Initiated Contact with Patient 10/18/14 [id number]     Chief Complaint  Patient presents with  . Flank Pain     (Consider location/radiation/quality/duration/timing/severity/associated sxs/prior Treatment) Patient is a 54 y.o. male presenting with flank pain. The history is provided by the patient.  Flank Pain This is a new problem. The current episode started more than 2 days ago. The problem occurs constantly. The problem has not changed since onset.Associated symptoms include abdominal pain. Pertinent negatives include no chest pain, no headaches and no shortness of breath. Nothing aggravates the symptoms. Nothing relieves the symptoms. He has tried nothing for the symptoms. The treatment provided no relief.    Past Medical History  Diagnosis Date  . Heart murmur   . Mitral valve prolapse   . Mitral regurgitation   . Aortic insufficiency   . Elevated PSA 10/12/2014   Past Surgical History  Procedure Laterality Date  . Hip fracture surgery      left  . Inguinal hernia repair      as infant  . Tee without cardioversion N/A 11/25/2013    Procedure: TRANSESOPHAGEAL ECHOCARDIOGRAM (TEE);  Surgeon: Quintella Reichert, MD;  Location: [facility] ENDOSCOPY;  Service: Cardiovascular;  Laterality: N/A;  . Left and right heart catheterization with coronary angiogram N/A 12/12/2013    Procedure: LEFT AND RIGHT HEART CATHETERIZATION WITH CORONARY ANGIOGRAM;  Surgeon: Corky Crafts, MD;  Location: [facility] CATH LAB;  Service: Cardiovascular;  Laterality: N/A;   Family History  Problem Relation Age of Onset  . Drug abuse Neg Hx   . Early death Neg Hx   . Heart disease Neg Hx   . Hyperlipidemia Neg Hx   . Hypertension Neg Hx   . Kidney disease Neg Hx   . Stroke Neg Hx   . Arthritis Neg Hx   . Cancer Mother    History  Substance Use Topics  . Smoking status: Never Smoker   . Smokeless tobacco: Never Used  . Alcohol Use: 3.0  oz/week    5 Cans of beer per week    Review of Systems  Constitutional: Negative for fever.  HENT: Negative for drooling and rhinorrhea.   Eyes: Negative for pain.  Respiratory: Negative for cough and shortness of breath.   Cardiovascular: Negative for chest pain and leg swelling.  Gastrointestinal: Positive for abdominal pain. Negative for nausea, vomiting and diarrhea.  Genitourinary: Positive for flank pain. Negative for dysuria and hematuria.  Musculoskeletal: Negative for gait problem and neck pain.  Skin: Negative for color change.  Neurological: Negative for numbness and headaches.  Hematological: Negative for adenopathy.  Psychiatric/Behavioral: Negative for behavioral problems.  All other systems reviewed and are negative.     Allergies  Review of patient's allergies indicates no known allergies.  Home Medications   Prior to Admission medications   Medication Sig Start Date End Date Taking? Authorizing Provider  ciclopirox (LOPROX) 0.77 % cream Apply topically 2 (two) times daily. 10/12/14   Etta Grandchild, MD   There were no vitals taken for this visit. Physical Exam  Constitutional: He is oriented to person, place, and time. He appears well-developed and well-nourished.  HENT:  Head: Normocephalic and atraumatic.  Right Ear: External ear normal.  Left Ear: External ear normal.  Nose: Nose normal.  Mouth/Throat: Oropharynx is clear and moist. No oropharyngeal exudate.  Eyes: Conjunctivae and EOM are normal. Pupils are equal, round,  and reactive to light.  Neck: Normal range of motion. Neck supple.  No vertebral tenderness noted.  Cardiovascular: Normal rate, regular rhythm and intact distal pulses.  Exam reveals no gallop and no friction rub.   Murmur heard. Pulmonary/Chest: Effort normal and breath sounds normal. No respiratory distress. He has no wheezes.  Abdominal: Soft. Bowel sounds are normal. He exhibits no distension. There is tenderness (mild left  flank tenderness.). There is no rebound and no guarding.  Musculoskeletal: Normal range of motion. He exhibits no edema or tenderness.  Neurological: He is alert and oriented to person, place, and time.  Skin: Skin is warm and dry.  Psychiatric: He has a normal mood and affect. His behavior is normal.  Nursing note and vitals reviewed.   ED Course  Procedures (including critical care time) Labs Review Labs Reviewed  COMPREHENSIVE METABOLIC PANEL - Abnormal; Notable for the following:    Glucose, Bld 106 (*)    All other components within normal limits  URINALYSIS, ROUTINE W REFLEX MICROSCOPIC  CBC WITH DIFFERENTIAL/PLATELET  LIPASE, BLOOD  POC URINE PREG, ED    Imaging Review Ct Renal Stone Study  10/18/2014   CLINICAL DATA:  Left upper flank pain.  EXAM: CT ABDOMEN AND PELVIS WITHOUT CONTRAST  TECHNIQUE: Multidetector CT imaging of the abdomen and pelvis was performed following the standard protocol without IV contrast.  COMPARISON:  None.  FINDINGS: Lung bases are clear. Postoperative changes in the heart which are incompletely evaluated on this examination. Negative for free air.  Unenhanced CT was performed per clinician order. Lack of IV contrast limits sensitivity and specificity, especially for evaluation of abdominal/pelvic solid viscera.  Normal appearance of the gallbladder, liver and spleen. Normal appearance of the pancreas and adrenal glands. No evidence for kidney stones or hydronephrosis. No acute abnormality involving the kidneys or urinary bladder. Possible low-density cyst in the left kidney upper pole. Normal appearance of the seminal vesicles and prostate. Moderate amount of stool in the colon. Normal appearance of the small bowel and appendix. There appears to be bilateral inguinal hernias containing fat. Difficult to exclude previous surgery in the inguinal areas, particularly on the left side. There is no significant free fluid or lymphadenopathy within the abdomen or  pelvis.  Internal fixation in the left femoral head and proximal left femur. No acute bone abnormalities.  IMPRESSION: No acute abnormalities in the abdomen or pelvis. Specifically, no evidence for kidney stones or hydronephrosis.   Electronically Signed   By: Richarda OverlieAdam  Henn M.D.   On: 10/18/2014 08:53     EKG Interpretation   Date/Time:  Sunday Oct 18 2014 08:31:47 EDT Ventricular Rate:  66 PR Interval:  188 QRS Duration: 114 QT Interval:  422 QTC Calculation: 442 R Axis:   -35 Text Interpretation:  Sinus rhythm LAE, consider biatrial enlargement LAD,  consider left anterior fascicular block Anteroseptal infarct, age  indeterminate No significant change since last tracing Confirmed by  Forest Pruden  MD, Kelsey Durflinger ([id number]) on 10/18/2014 8:36:08 AM      MDM   Final diagnoses:  Left flank pain    8:04 AM 54 y.o. male with history of mitral valve repair and aortic valve replacement who presents with left flank pain for the last 3-4 days. He denies any fevers, vomiting, diarrhea, or other associated symptoms. Vital signs unremarkable here. Suspicious for kidney stone although no history of this.  The patient was unable to void spontaneously here but stated that he had voided just prior to arrival. In  out cath was unsuccessful. Bladder scan showed around 500 mL. The patient was ultimately able to void a large amount of urine on his own. Possibly iatrogenic as he states he has trouble urinating under pressure.  2:40 PM: I interpreted/reviewed the labs and/or imaging which were non-contributory.  Possibly msk cause of pain.  I have discussed the diagnosis/risks/treatment options with the patient and believe the pt to be eligible for discharge home to follow-up with his pcp in 1-2 days. We also discussed returning to the ED immediately if new or worsening sx occur. We discussed the sx which are most concerning (e.g., fever, vomiting, worsening pain) that necessitate immediate return. Medications  administered to the patient during their visit and any new prescriptions provided to the patient are listed below.  Medications given during this visit Medications  ondansetron (ZOFRAN) injection 4 mg (not administered)  sodium chloride 0.9 % bolus 500 mL (0 mLs Intravenous Stopped 10/18/14 1128)  HYDROmorphone (DILAUDID) injection 1 mg (1 mg Intravenous Given 10/18/14 0817)  HYDROmorphone (DILAUDID) injection 0.5 mg (0.5 mg Intravenous Given 10/18/14 0934)    New Prescriptions   DOCUSATE SODIUM (COLACE) 100 MG CAPSULE    Take 1 capsule (100 mg total) by mouth daily as needed for mild constipation.   OXYCODONE-ACETAMINOPHEN (PERCOCET) 5-325 MG PER TABLET    Take 1-2 tablets by mouth every 6 (six) hours as needed for moderate pain.       Purvis Sheffield, MD 10/18/14 [phone]

## 2014-10-18 NOTE — ED Notes (Addendum)
Per pt, states left upper flank pain that started last Thursday-pain with movement-no trauma/injury-no dysuria

## 2014-10-18 NOTE — ED Notes (Signed)
Bladder scanned pt./ bladder scan 507cc. Nurse aware.

## 2014-10-18 NOTE — ED Notes (Addendum)
Unable to urinate at the moment.

## 2014-10-18 NOTE — ED Notes (Signed)
In and Out Cath was unsuccessful. No urine return. Nurse was notified.

## 2014-10-18 NOTE — ED Notes (Signed)
Patient taken to bathroom unable to urinate.

## 2014-10-18 NOTE — Discharge Instructions (Signed)
Flank Pain °Flank pain is pain in your side. The flank is the area of your side between your upper belly (abdomen) and your back. Pain in this area can be caused by many different things. °HOME CARE °Home care and treatment will depend on the cause of your pain. °· Rest as told by your doctor. °· Drink enough fluids to keep your pee (urine) clear or pale yellow.   °· Only take medicine as told by your doctor. °· Tell your doctor about any changes in your pain. °· Follow up with your doctor. °GET HELP RIGHT AWAY IF:  °· Your pain does not get better with medicine.   °· You have new symptoms or your symptoms get worse. °· Your pain gets worse.   °· You have belly (abdominal) pain.   °· You are short of breath.   °· You always feel sick to your stomach (nauseous).   °· You keep throwing up (vomiting).   °· You have puffiness (swelling) in your belly.   °· You feel light-headed or you pass out (faint).   °· You have blood in your pee. °· You have a fever or lasting symptoms for more than 2-3 days. °· You have a fever and your symptoms suddenly get worse. °MAKE SURE YOU:  °· Understand these instructions. °· Will watch your condition. °· Will get help right away if you are not doing well or get worse. °Document Released: 02/22/2008 Document Revised: 09/29/2013 Document Reviewed: 12/28/2011 °ExitCare® Patient Information ©2015 [employer]. This information is not intended to replace advice given to you by your health care provider. Make sure you discuss any questions you have with your health care provider. ° °

## 2014-10-19 ENCOUNTER — Other Ambulatory Visit: Payer: Self-pay | Admitting: Internal Medicine

## 2014-10-20 ENCOUNTER — Telehealth: Payer: Self-pay | Admitting: *Deleted

## 2014-10-20 NOTE — Telephone Encounter (Signed)
[facility] - Client TELEPHONE ADVICE RECORD [facility] Patient Name: Cody MuseSTUART Barnes Gender: Male DOB: [birth year] Age: [age] Y 5 M 10 D Return Phone Number: [phone] (Primary), [phone] (Secondary) Address: [address] City[place]State/Zip: [place] [address] Client [facility] - Client Client Site [facility] Night Physician Sanda LingerJones, Thomas Contact Type Call Call Type Triage / Clinical Caller Name Debra Relationship To Patient Spouse Return Phone Number [phone] (Primary) Chief Complaint Back Pain - General Initial Comment Caller states husband started to have pain in back yesterday, on the left side of lower back and getting worse PreDisposition Go to ED Nurse Assessment Nurse: Orson AloeHenderson, RN, Morley KosGeorgeanne Date/Time (Eastern Time): 10/18/2014 7:19:03 AM Confirm and document reason for call. If symptomatic, describe symptoms. ---pt has back pain and this morning is in bad pain. no fever. no pain on urination. Has the patient traveled out of the country within the last 30 days? ---No Does the patient require triage? ---Yes Related visit to physician within the last 2 weeks? ---No Does the PT have any chronic conditions? (i.e. diabetes, asthma, etc.) ---No Guidelines Guideline Title Affirmed Question Affirmed Notes Nurse Date/Time Lamount Cohen(Eastern Time) Flank Pain [1] SEVERE pain (e.g., excruciating, scale 8-10) AND [2] present > 1 hour Orson AloeHenderson, RN, Morley KosGeorgeanne 10/18/2014 7:23:17 AM Disp. Time Lamount Cohen(Eastern Time) Disposition Final User 10/18/2014 7:26:52 AM Go to ED Now Yes Orson AloeHenderson, RN, Francis GainesGeorgeanne Caller Understands: Yes Disagree/Comply: Comply Care Advice Given Per Guideline PLEASE NOTE: All timestamps contained within this report are represented as [place] Standard Time. CONFIDENTIALTY NOTICE: This fax transmission is intended only for the addressee. It contains information that is legally  privileged, confidential or otherwise protected from use or disclosure. If you are not the intended recipient, you are strictly prohibited from reviewing, disclosing, copying using or disseminating any of this information or taking any action in reliance on or regarding this information. If you have received this fax in error, please notify us immediately by telephone so that we can arrange for its return to us. Phone: [phone], Toll-Free: [phone], Fax: [phone] Page: 2 of 2 Call Id: [id number] Care Advice Given Per Guideline GO TO ED NOW: You need to be seen in the Emergency Department. Go to the ER at ___________ Hospital. Leave now. Drive carefully. DRIVING: Another adult should drive. BRING MEDICINES: CARE ADVICE given per Flank Pain (Adult) guideline. After Care Instructions Given Call Event Type User Date / Time Description Referrals [facility]

## 2014-10-21 ENCOUNTER — Encounter: Payer: Self-pay | Admitting: Internal Medicine

## 2014-12-19 IMAGING — CT CT ANGIO CHEST
2 of 8 series · 2 of 36 positions shown · IV contrast (omnipaque)
Comparison: No priors.

CLINICAL DATA: Shortness of breath with exertion.  Heart murmur.

EXAM:
CT ANGIOGRAPHY CHEST WITH CONTRAST
TECHNIQUE: Multidetector CT imaging of the chest was performed using the
standard protocol during bolus administration of intravenous
contrast. Multiplanar CT image reconstructions and MIPs were
obtained to evaluate the vascular anatomy.
CONTRAST:  100mL OMNIPAQUE IOHEXOL 350 MG/ML SOLN

[Series 300: locator · axial · 0.49mm/px · 1 of 1 slices shown]
[im 1/1  lung]
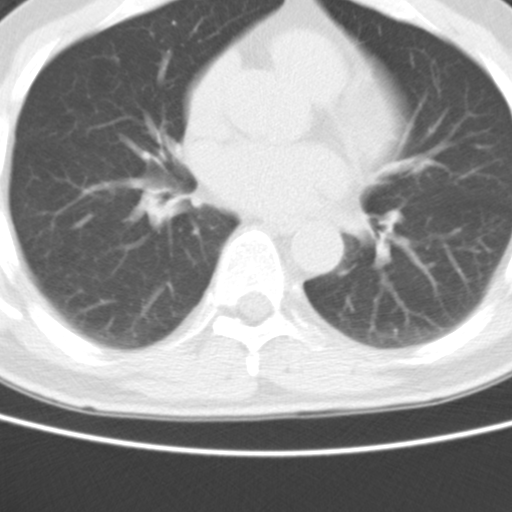

[Series 509: coronal mpr · coronal · 0.45mm/px · 1 of 150 slices shown]
[im 75/150  mediastinal]
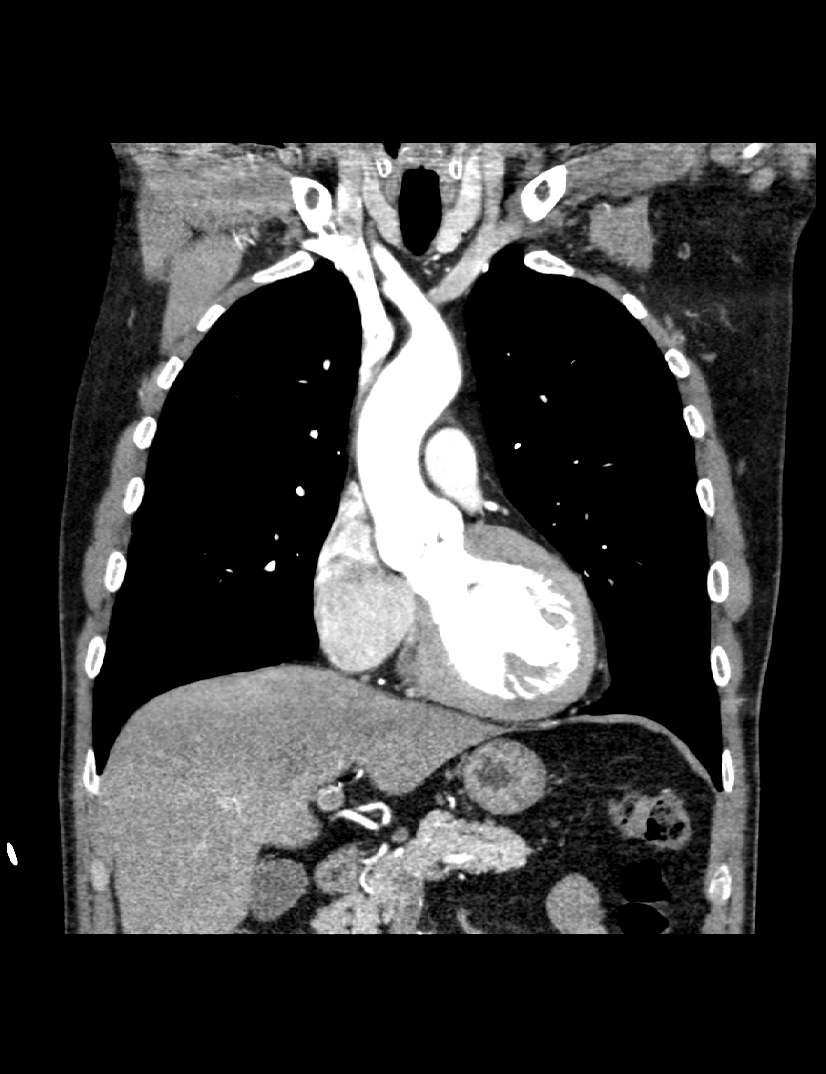

[2 of 36 positions shown; findings below may reference images not displayed]

FINDINGS: Mediastinum: The aortic valve appears to have 3 cusps (although
difficult to say for certain on today's study which was cardiac
gated, but only obtained during late-diastolic phase). The left cusp
of the valve is irregular, and appears to slightly prolapse
resulting in a central area of malcoaptation which is estimated to
measure approximately 13 mm sq by planimetry. The aortic root
measures approximately 32 mm at the annulus, 41 mm at the sinuses of
Valsalva and 30 mm at the sinotubular junction. The ascending
thoracic aorta, mid arch and descending thoracic aorta are normal in
caliber measuring 3.0 cm, 2.8 cm and 2.2 cm in diameter
respectively. No evidence of thoracic aortic dissection. Heart size
is normal. There is no significant pericardial fluid, thickening or
pericardial calcification. Examination was not tailored for
evaluation of the coronary arteries, however, no coronary artery
calcium or hemodynamically significant stenosis is identified on
today's gated study. No pathologically enlarged mediastinal or hilar
lymph nodes. Esophagus is unremarkable in appearance.

Lungs/Pleura: Small calcified granuloma in the posteromedial aspect
of the left lower lobe. No other suspicious appearing pulmonary
nodules or masses are noted. No acute consolidative airspace
disease. No pleural effusions.

Upper Abdomen: [DATE] x 0.9 cm low-attenuation lesion in the upper pole
of the left kidney is compatible with a small simple cyst.

Musculoskeletal: There are no aggressive appearing lytic or blastic
lesions noted in the visualized portions of the skeleton.

Review of the MIP images confirms the above findings.
IMPRESSION: 1. The aortic root is only mildly dilated measuring 41 mm at the
level of the sinuses of Valsalva. The thoracic aorta is otherwise
normal in caliber, as detailed above.
2. The aortic valve appears to have 3 cusps, however, the left cusp
of the valve is irregular and appears to slightly prolapse resulting
in a small central area of malcoaptation estimated to measure
approximately 13 mm squared by planimetry.
3. Additional incidental findings, as above.

## 2015-01-06 LAB — PSA: PSA: 3.17

## 2015-02-05 ENCOUNTER — Ambulatory Visit: Payer: 59 | Admitting: Internal Medicine

## 2015-02-10 ENCOUNTER — Encounter: Payer: Self-pay | Admitting: Internal Medicine

## 2015-02-10 ENCOUNTER — Ambulatory Visit: Payer: 59 | Admitting: Internal Medicine

## 2015-02-10 ENCOUNTER — Ambulatory Visit (INDEPENDENT_AMBULATORY_CARE_PROVIDER_SITE_OTHER): Payer: 59 | Admitting: Internal Medicine

## 2015-02-10 ENCOUNTER — Encounter: Payer: 59 | Admitting: Internal Medicine

## 2015-02-10 VITALS — BP 120/78 | HR 63 | Temp 97.8°F | Resp 16 | Ht 72.0 in | Wt 183.0 lb

## 2015-02-10 DIAGNOSIS — Z23 Encounter for immunization: Secondary | ICD-10-CM

## 2015-02-10 DIAGNOSIS — I351 Nonrheumatic aortic (valve) insufficiency: Secondary | ICD-10-CM | POA: Diagnosis not present

## 2015-02-10 DIAGNOSIS — Z0000 Encounter for general adult medical examination without abnormal findings: Secondary | ICD-10-CM

## 2015-02-10 NOTE — Progress Notes (Signed)
Subjective:  Patient ID: Cody Barnes, male    DOB: [birth year]  Age: 54 y.o. MRN: [id number]  CC: Cardiac Valve Problem   HPI Cody Barnes presents for follow-up. He feels well and offers no complaints.  Outpatient Prescriptions Prior to Visit  Medication Sig Dispense Refill  . ciclopirox (LOPROX) 0.77 % cream Apply topically 2 (two) times daily. 90 g 1  . docusate sodium (COLACE) 100 MG capsule Take 1 capsule (100 mg total) by mouth daily as needed for mild constipation. 30 capsule 0  . oxyCODONE-acetaminophen (PERCOCET) 5-325 MG per tablet Take 1-2 tablets by mouth every 6 (six) hours as needed for moderate pain. 20 tablet 0   No facility-administered medications prior to visit.    ROS Review of Systems  Constitutional: Negative.   HENT: Negative.   Eyes: Negative.   Respiratory: Negative.  Negative for cough, choking, chest tightness, shortness of breath and stridor.   Cardiovascular: Negative.  Negative for chest pain, palpitations and leg swelling.  Gastrointestinal: Negative.  Negative for abdominal pain.  Endocrine: Negative.   Genitourinary: Negative.   Musculoskeletal: Negative.   Skin: Negative.   Allergic/Immunologic: Negative.   Neurological: Negative.  Negative for dizziness, tremors, syncope, light-headedness and numbness.  Hematological: Negative.   Psychiatric/Behavioral: Negative.     Objective:  BP 120/78 mmHg  Pulse 63  Temp(Src) 97.8 F (36.6 C) (Oral)  Resp 16  Ht 6' (1.829 m)  Wt 183 lb (83.008 kg)  BMI 24.81 kg/m2  SpO2 96%  BP Readings from Last 3 Encounters:  02/10/15 120/78  10/18/14 113/80  10/12/14 114/78    Wt Readings from Last 3 Encounters:  02/10/15 183 lb (83.008 kg)  10/12/14 184 lb (83.462 kg)  07/29/14 179 lb (81.194 kg)    Physical Exam  Constitutional: He is oriented to person, place, and time. No distress.  HENT:  Mouth/Throat: Oropharynx is clear and moist. No oropharyngeal exudate.  Eyes: Conjunctivae are  normal. Right eye exhibits no discharge. Left eye exhibits no discharge. No scleral icterus.  Neck: Normal range of motion. Neck supple. No JVD present. No tracheal deviation present. No thyromegaly present.  Cardiovascular: Normal rate, regular rhythm and intact distal pulses.  Exam reveals no gallop and no friction rub.   Murmur heard. Pulmonary/Chest: Effort normal and breath sounds normal. No stridor. No respiratory distress. He has no wheezes. He has no rales. He exhibits no tenderness.  Abdominal: Soft. Bowel sounds are normal. He exhibits no distension and no mass. There is no tenderness. There is no rebound and no guarding.  Musculoskeletal: Normal range of motion. He exhibits no edema or tenderness.  Lymphadenopathy:    He has no cervical adenopathy.  Neurological: He is oriented to person, place, and time.  Skin: Skin is warm and dry. No rash noted. He is not diaphoretic. No erythema. No pallor.  Psychiatric: He has a normal mood and affect. His behavior is normal. Judgment and thought content normal.  Vitals reviewed.   Lab Results  Component Value Date   WBC 7.9 10/18/2014   HGB 15.4 10/18/2014   HCT 45.8 10/18/2014   PLT 189 10/18/2014   GLUCOSE 106* 10/18/2014   CHOL 199 10/12/2014   TRIG 125.0 10/12/2014   HDL 47.30 10/12/2014   LDLCALC 127* 10/12/2014   ALT 53 10/18/2014   AST 37 10/18/2014   NA 138 10/18/2014   K 4.3 10/18/2014   CL 104 10/18/2014   CREATININE 1.09 10/18/2014   BUN 18 10/18/2014  CO2 23 10/18/2014   TSH 1.09 10/12/2014   PSA 3.17 01/06/2015   INR 1.0 12/10/2013    Ct Renal Stone Study  10/18/2014   CLINICAL DATA:  Left upper flank pain.  EXAM: CT ABDOMEN AND PELVIS WITHOUT CONTRAST  TECHNIQUE: Multidetector CT imaging of the abdomen and pelvis was performed following the standard protocol without IV contrast.  COMPARISON:  None.  FINDINGS: Lung bases are clear. Postoperative changes in the heart which are incompletely evaluated on this  examination. Negative for free air.  Unenhanced CT was performed per clinician order. Lack of IV contrast limits sensitivity and specificity, especially for evaluation of abdominal/pelvic solid viscera.  Normal appearance of the gallbladder, liver and spleen. Normal appearance of the pancreas and adrenal glands. No evidence for kidney stones or hydronephrosis. No acute abnormality involving the kidneys or urinary bladder. Possible low-density cyst in the left kidney upper pole. Normal appearance of the seminal vesicles and prostate. Moderate amount of stool in the colon. Normal appearance of the small bowel and appendix. There appears to be bilateral inguinal hernias containing fat. Difficult to exclude previous surgery in the inguinal areas, particularly on the left side. There is no significant free fluid or lymphadenopathy within the abdomen or pelvis.  Internal fixation in the left femoral head and proximal left femur. No acute bone abnormalities.  IMPRESSION: No acute abnormalities in the abdomen or pelvis. Specifically, no evidence for kidney stones or hydronephrosis.   Electronically Signed   By: Richarda Overlie M.D.   On: 10/18/2014 08:53    Assessment & Plan:   [name] was seen today for cardiac valve problem.  Diagnoses and all orders for this visit:  Routine general medical examination at a health care facility -     Cancel: Lipid panel; Future -     Cancel: Comprehensive metabolic panel; Future -     Cancel: CBC with Differential/Platelet; Future -     Cancel: TSH; Future -     Cancel: PSA; Future   I have discontinued [name] ciclopirox, oxyCODONE-acetaminophen, and docusate sodium.  No orders of the defined types were placed in this encounter.     Follow-up: Return in about 1 year (around 02/10/2016).  Sanda Linger, MD

## 2015-02-10 NOTE — Progress Notes (Signed)
Pre visit review using our clinic review tool, if applicable. No additional management support is needed unless otherwise documented below in the visit note. 

## 2015-02-10 NOTE — Patient Instructions (Signed)

## 2015-02-10 NOTE — Assessment & Plan Note (Signed)
He is doing well with no signs or symptoms related to this. He will be due for cardiac follow-up in February 2017.

## 2015-02-11 NOTE — Addendum Note (Signed)
Addended by: Tonye Becket on: 02/11/2015 09:33 AM   Modules accepted: Orders

## 2015-09-28 NOTE — Progress Notes (Signed)
Patient ID: Cody Barnes, male   DOB: March 19, 1961, 55 y.o.   MRN: [id number]     Cardiology Office Note   Date:  09/29/2015   ID:  Cody Barnes, DOB 28-Sep-1960, MRN [id number]  PCP:  Sanda Linger, MD    No chief complaint on file. f/u valve surgery   Wt Readings from Last 3 Encounters:  09/29/15 186 lb (84.369 kg)  02/10/15 183 lb (83.008 kg)  10/12/14 184 lb (83.462 kg)       History of Present Illness: Cody Barnes is a 55 y.o. male  who has had mitral valve prolapse for many years. He was followed with serial echoes. He was noted to have increased LV size at the time of his last echocardiogram in 2015 with some symptoms of dyspnea on exertion despite preserved LVEF. He was referred for surgery. He chose to go to [facility] clinic for his operation. He underwent mitral valve repair and aortic valve replacement due to aortic insufficiency as well. He chose to have a bioprosthetic valve. Cardiac cath prior to the valve surgery was negative for significant coronary artery disease.  He has done well since his surgery. He has returned to regular exercise, typically walking, 3-4 days 45-60 minutes at a time.  No chest pain or shortness of breath with walking.  He played golf a few days ago and had some chest soreness with swinging.  Did not feel like "heart pain."    Past Medical History  Diagnosis Date  . Heart murmur   . Mitral valve prolapse   . Mitral regurgitation   . Aortic insufficiency   . Elevated PSA 10/12/2014    Past Surgical History  Procedure Laterality Date  . Hip fracture surgery      left  . Inguinal hernia repair      as infant  . Tee without cardioversion N/A 11/25/2013    Procedure: TRANSESOPHAGEAL ECHOCARDIOGRAM (TEE);  Surgeon: Quintella Reichert, MD;  Location: [facility] ENDOSCOPY;  Service: Cardiovascular;  Laterality: N/A;  . Left and right heart catheterization with coronary angiogram N/A 12/12/2013    Procedure: LEFT AND RIGHT HEART CATHETERIZATION WITH  CORONARY ANGIOGRAM;  Surgeon: Corky Crafts, MD;  Location: [facility] CATH LAB;  Service: Cardiovascular;  Laterality: N/A;     No current outpatient prescriptions on file.   No current facility-administered medications for this visit.    Allergies:   Review of patient's allergies indicates no known allergies.    Social History:  The patient  reports that he has never smoked. He has never used smokeless tobacco. He reports that he drinks about 3.0 oz of alcohol per week. He reports that he does not use illicit drugs.   Family History:  The patient's family history includes Cancer in his mother. There is no history of Drug abuse, Early death, Heart disease, Hyperlipidemia, Hypertension, Kidney disease, Stroke, or Arthritis.    ROS:  Please see the history of present illness.   Otherwise, review of systems are positive for incision looking "puffier" in the last year.   All other systems are reviewed and negative.    PHYSICAL EXAM: VS:  BP 120/86 mmHg  Pulse 65  Ht 6' (1.829 m)  Wt 186 lb (84.369 kg)  BMI 25.22 kg/m2 , BMI Body mass index is 25.22 kg/(m^2). GEN: Well nourished, well developed, in no acute distress HEENT: normal Neck: no JVD, carotid bruits, or masses Cardiac: RRR; 2/6 early systolic murmur murmurs, rubs, or gallops,no edema  Respiratory:  clear to  auscultation bilaterally, normal work of breathing GI: soft, nontender, nondistended, + BS MS: no deformity or atrophy Skin: warm and dry, no rash Neuro:  Strength and sensation are intact Psych: euthymic mood, full affect   EKG:   The ekg ordered today demonstrates NSR, LAD, no ST segment changes   Recent Labs: 10/12/2014: TSH 1.09 10/18/2014: ALT 53; BUN 18; Creatinine, Ser 1.09; Hemoglobin 15.4; Platelets 189; Potassium 4.3; Sodium 138   Lipid Panel    Component Value Date/Time   CHOL 199 10/12/2014 0926   TRIG 125.0 10/12/2014 0926   HDL 47.30 10/12/2014 0926   CHOLHDL 4 10/12/2014 0926   VLDL 25.0  10/12/2014 0926   LDLCALC 127* 10/12/2014 0926     Other studies Reviewed: Additional studies/ records that were reviewed today with results demonstrating: last echo from 2015 June.   ASSESSMENT AND PLAN:  1. Mitral valve repair:Status post mitral valve repair. He needs SBE prophylaxis. No signs of heart failure. 2. Aortic valve replacement: murmur on exam may be related to valve surgery.  Check echo.  3. No CAD on cath in 2015.   Current medicines are reviewed at length with the patient today.  The patient concerns regarding his medicines were addressed.  The following changes have been made:  No change  Labs/ tests ordered today include: echo No orders of the defined types were placed in this encounter.    Recommend 150 minutes/week of aerobic exercise Low fat, low carb, high fiber diet recommended  Disposition:   FU in 1 year   Delorise JacksonSigned, Rosco Harriott S., MD  09/29/2015 8:39 AM    [facility] [address], [place], [place]  [address] Phone: [phone]) [phone]; Fax: [phone]

## 2015-09-29 ENCOUNTER — Encounter: Payer: Self-pay | Admitting: Interventional Cardiology

## 2015-09-29 ENCOUNTER — Ambulatory Visit (INDEPENDENT_AMBULATORY_CARE_PROVIDER_SITE_OTHER): Payer: 59 | Admitting: Interventional Cardiology

## 2015-09-29 VITALS — BP 120/86 | HR 65 | Ht 72.0 in | Wt 186.0 lb

## 2015-09-29 DIAGNOSIS — I351 Nonrheumatic aortic (valve) insufficiency: Secondary | ICD-10-CM | POA: Diagnosis not present

## 2015-09-29 DIAGNOSIS — I340 Nonrheumatic mitral (valve) insufficiency: Secondary | ICD-10-CM | POA: Diagnosis not present

## 2015-09-29 NOTE — Patient Instructions (Signed)
Medication Instructions:  Same-no changes  Labwork: None  Testing/Procedures: Your physician has requested that you have an echocardiogram. Echocardiography is a painless test that uses sound waves to create images of your heart. It provides your doctor with information about the size and shape of your heart and how well your heart's chambers and valves are working. This procedure takes approximately one hour. There are no restrictions for this procedure.   Follow-Up: Your physician wants you to follow-up in: 1 year. You will receive a reminder letter in the mail two months in advance. If you don't receive a letter, please call our office to schedule the follow-up appointment.     If you need a refill on your cardiac medications before your next appointment, please call your pharmacy.   

## 2015-10-12 ENCOUNTER — Ambulatory Visit (HOSPITAL_COMMUNITY): Payer: 59 | Attending: Cardiology

## 2015-10-12 ENCOUNTER — Other Ambulatory Visit: Payer: Self-pay

## 2015-10-12 DIAGNOSIS — I5189 Other ill-defined heart diseases: Secondary | ICD-10-CM | POA: Diagnosis not present

## 2015-10-12 DIAGNOSIS — I340 Nonrheumatic mitral (valve) insufficiency: Secondary | ICD-10-CM | POA: Insufficient documentation

## 2015-10-12 DIAGNOSIS — I351 Nonrheumatic aortic (valve) insufficiency: Secondary | ICD-10-CM | POA: Insufficient documentation

## 2015-10-12 DIAGNOSIS — Z953 Presence of xenogenic heart valve: Secondary | ICD-10-CM | POA: Diagnosis not present

## 2015-10-12 DIAGNOSIS — I517 Cardiomegaly: Secondary | ICD-10-CM | POA: Diagnosis not present

## 2015-10-12 DIAGNOSIS — I059 Rheumatic mitral valve disease, unspecified: Secondary | ICD-10-CM | POA: Diagnosis present

## 2015-11-01 LAB — PSA: PSA: 2.77

## 2015-11-08 LAB — PSA: PSA: 2.77

## 2015-11-17 ENCOUNTER — Other Ambulatory Visit: Payer: Self-pay | Admitting: Internal Medicine

## 2015-12-16 ENCOUNTER — Ambulatory Visit (INDEPENDENT_AMBULATORY_CARE_PROVIDER_SITE_OTHER): Payer: 59 | Admitting: Internal Medicine

## 2015-12-16 ENCOUNTER — Telehealth: Payer: Self-pay | Admitting: Emergency Medicine

## 2015-12-16 ENCOUNTER — Encounter: Payer: Self-pay | Admitting: Internal Medicine

## 2015-12-16 VITALS — BP 130/80 | HR 65 | Temp 98.4°F | Resp 20 | Wt 182.0 lb

## 2015-12-16 DIAGNOSIS — B029 Zoster without complications: Secondary | ICD-10-CM | POA: Diagnosis not present

## 2015-12-16 DIAGNOSIS — Z9889 Other specified postprocedural states: Secondary | ICD-10-CM

## 2015-12-16 DIAGNOSIS — Z953 Presence of xenogenic heart valve: Secondary | ICD-10-CM

## 2015-12-16 HISTORY — DX: Other specified postprocedural states: Z98.890

## 2015-12-16 HISTORY — DX: Presence of xenogenic heart valve: Z95.3

## 2015-12-16 MED ORDER — VALACYCLOVIR HCL 1 G PO TABS: 1000.0000 mg | ORAL_TABLET | Freq: Three times a day (TID) | ORAL | Status: DC

## 2015-12-16 MED ORDER — GABAPENTIN 100 MG PO CAPS: 100.0000 mg | ORAL_CAPSULE | Freq: Three times a day (TID) | ORAL | Status: DC

## 2015-12-16 MED ORDER — LIDOCAINE 5 % EX PTCH: 1.0000 | MEDICATED_PATCH | CUTANEOUS | Status: DC

## 2015-12-16 MED ORDER — HYDROCODONE-ACETAMINOPHEN 5-325 MG PO TABS: 1.0000 | ORAL_TABLET | Freq: Four times a day (QID) | ORAL | Status: DC | PRN

## 2015-12-16 NOTE — Progress Notes (Signed)
   Subjective:    Patient ID: Cody Barnes, male    DOB: 24-Jan-1961, 55 y.o.   MRN: [id number]  HPI  Here with left lower chest/left lateral side pain, constant, dull/sharp/burning, moderate but ? Getting worse, and hard to sleep, tylenol wears off too soon and wakes up 3AM and cannot get the right position to go back to sleep, no fever, trauma, Pt denies other chest pain, increased sob or doe, wheezing, orthopnea, PND, increased LE swelling, palpitations, dizziness or syncope.  Though initially might be some kind of stone such as renal, but Denies urinary symptoms such as dysuria, frequency, urgency, flank pain, hematuria or n/v, fever, chills.  Now with new rash to the area since yesterday.   Past Medical History  Diagnosis Date  . Heart murmur   . Mitral valve prolapse   . Mitral regurgitation   . Aortic insufficiency   . Elevated PSA 10/12/2014   Past Surgical History  Procedure Laterality Date  . Hip fracture surgery      left  . Inguinal hernia repair      as infant  . Tee without cardioversion N/A 11/25/2013    Procedure: TRANSESOPHAGEAL ECHOCARDIOGRAM (TEE);  Surgeon: Quintella Reichertraci R Turner, MD;  [facility];  Service: Cardiovascular;  Laterality: N/A;  . Left and right heart catheterization with coronary angiogram N/A 12/12/2013    Procedure: LEFT AND RIGHT HEART CATHETERIZATION WITH CORONARY ANGIOGRAM;  Surgeon: Corky CraftsJayadeep S Varanasi, MD;  [facility];  Service: Cardiovascular;  Laterality: N/A;    reports that he has never smoked. He has never used smokeless tobacco. He reports that he drinks about 3.0 oz of alcohol per week. He reports that he does not use illicit drugs. family history includes Cancer in his mother. There is no history of Drug abuse, Early death, Heart disease, Hyperlipidemia, Hypertension, Kidney disease, Stroke, or Arthritis. No Known Allergies No current outpatient prescriptions on file prior to visit.   No current facility-administered  medications on file prior to visit.   Review of Systems  Constitutional: Negative for unusual diaphoresis or night sweats HENT: Negative for ear swelling or discharge Eyes: Negative for worsening visual haziness  Respiratory: Negative for choking and stridor.   Gastrointestinal: Negative for distension or worsening eructation Genitourinary: Negative for retention or change in urine volume.  Musculoskeletal: Negative for other MSK pain or swelling Skin: Negative for color change and worsening wound Neurological: Negative for tremors and numbness other than noted  Psychiatric/Behavioral: Negative for decreased concentration or agitation other than above       Objective:   Physical Exam BP 130/80 mmHg  Pulse 65  Temp(Src) 98.4 F (36.9 C) (Oral)  Resp 20  Wt 182 lb (82.555 kg)  SpO2 97% VS noted,  Constitutional: Pt appears in no apparent distress HENT: Head: NCAT.  Right Ear: External ear normal.  Left Ear: External ear normal.  Eyes: . Pupils are equal, round, and reactive to light. Conjunctivae and EOM are normal Neck: Normal range of motion. Neck supple.  Cardiovascular: Normal rate and regular rhythm.   Pulmonary/Chest: Effort normal and breath sounds without rales or wheezing.  Abd:  Soft, NT, ND, + BS Neurological: Pt is alert. Not confused , motor grossly intact Skin: Skin is warm. no LE edema, + grouped vesicles on erythem base Psychiatric: Pt behavior is normal. No agitation.     Assessment & Plan:

## 2015-12-16 NOTE — Assessment & Plan Note (Signed)
New onset x 2 days with mod to severe pain, for valtrex asd, lidoderm pnr, hydrocodone prn, and gabapentin trial 100 tid which can be increased to 300 tid if no sedation

## 2015-12-16 NOTE — Progress Notes (Signed)
Pre visit review using our clinic review tool, if applicable. No additional management support is needed unless otherwise documented below in the visit note. 

## 2015-12-16 NOTE — Telephone Encounter (Signed)
Forwarding to Dr. John 

## 2015-12-16 NOTE — Patient Instructions (Signed)
Please take all new medication as prescribed - the antibiotic (valtrex), gabapentin for pain, hydrocodone for pain as needed, and lidoderm patch for pain as well  Please continue all other medications as before, and refills have been done if requested.  Please have the pharmacy call with any other refills you may need.  Please keep your appointments with your specialists as you may have planned

## 2015-12-16 NOTE — Telephone Encounter (Signed)
I would do the patch as this is the common treatment , and the gel would be pretty messy

## 2015-12-16 NOTE — Telephone Encounter (Signed)
Pts wife called an stated her husband was prescribed lidocaine (LIDODERM) 5 %. She was wondering if the gel works better or if that would be easier to get since he will be leaving to go out off town tomorrow. Please follow up thanks.

## 2015-12-17 NOTE — Telephone Encounter (Signed)
Patient advised.

## 2015-12-17 NOTE — Telephone Encounter (Signed)
PA started for Lidocaine 5% patch. Pt had a recent diagnosis of shingles. Provider advised that the patch would be the most effective therapy for treatment of the post herpetic neuralgia pain.   KEY: [id number]

## 2015-12-17 NOTE — Telephone Encounter (Signed)
Called pharmacy to left them know that PA has been approved. They said it would take up to 48 hours before it would go through on their end because it has not went through for them yet. Called patient to advise him of this, left message to give them a call back.

## 2015-12-20 ENCOUNTER — Telehealth: Payer: Self-pay | Admitting: *Deleted

## 2015-12-20 ENCOUNTER — Telehealth: Payer: Self-pay | Admitting: Emergency Medicine

## 2015-12-20 NOTE — Telephone Encounter (Signed)
Left msg on triage pt has been calling about the Lidoderm refill. [employer] is needing PA. The PA # [phone]...Raechel Chute

## 2015-12-20 NOTE — Telephone Encounter (Signed)
Patient states insurance company does not have PA back from office.  Patient was very uncomfortable over the weekend and needs something.  Spouse is very upset because insurance company states they are waiting on our office.  Spouse is requesting a call back today at [phone].

## 2015-12-20 NOTE — Telephone Encounter (Signed)
Pt wife called and wanted to know if she can get a note stating pt had shingles and was not able to travel. They were suppose to fly to see pts father at a nursing home and didn't think it was a good idea or safe to go in that environment with shingles. Patent with come pick up when ready.Please advise thanks.

## 2015-12-20 NOTE — Telephone Encounter (Signed)
Note placed up front for pick up, patient aware

## 2015-12-20 NOTE — Telephone Encounter (Signed)
Corrine contacted regarding the PA has been approved on 12/17/15.   Contacting the pharmacy: Pharmacy was able to get it run through with current insurance information.   Pharmacy is contacting patient spouse regarding same.

## 2015-12-20 NOTE — Telephone Encounter (Signed)
Yes, I am ok with a note for this

## 2015-12-20 NOTE — Telephone Encounter (Signed)
I have not seen anything about a PA for this?

## 2015-12-28 ENCOUNTER — Encounter: Payer: Self-pay | Admitting: Internal Medicine

## 2015-12-28 ENCOUNTER — Other Ambulatory Visit (INDEPENDENT_AMBULATORY_CARE_PROVIDER_SITE_OTHER): Payer: 59

## 2015-12-28 ENCOUNTER — Ambulatory Visit (INDEPENDENT_AMBULATORY_CARE_PROVIDER_SITE_OTHER): Payer: 59 | Admitting: Internal Medicine

## 2015-12-28 VITALS — BP 132/88 | HR 68 | Temp 98.0°F | Ht 72.0 in | Wt 185.0 lb

## 2015-12-28 DIAGNOSIS — R739 Hyperglycemia, unspecified: Secondary | ICD-10-CM

## 2015-12-28 DIAGNOSIS — Z0000 Encounter for general adult medical examination without abnormal findings: Secondary | ICD-10-CM

## 2015-12-28 DIAGNOSIS — Z23 Encounter for immunization: Secondary | ICD-10-CM | POA: Diagnosis not present

## 2015-12-28 LAB — LIPID PANEL
Cholesterol: 194 mg/dL (ref 0–200)
HDL: 44.2 mg/dL (ref >39.00)
LDL Cholesterol: 124 mg/dL — ABNORMAL HIGH (ref 0–99)
NonHDL: 149.42
Total CHOL/HDL Ratio: 4
Triglycerides: 128 mg/dL (ref 0.0–149.0)
VLDL: 25.6 mg/dL (ref 0.0–40.0)

## 2015-12-28 LAB — COMPREHENSIVE METABOLIC PANEL
ALT: 64 U/L — ABNORMAL HIGH (ref 0–53)
AST: 23 U/L (ref 0–37)
Albumin: 4.5 g/dL (ref 3.5–5.2)
Alkaline Phosphatase: 77 U/L (ref 39–117)
BUN: 21 mg/dL (ref 6–23)
CO2: 28 mEq/L (ref 19–32)
Calcium: 9.5 mg/dL (ref 8.4–10.5)
Chloride: 104 mEq/L (ref 96–112)
Creatinine, Ser: 1.2 mg/dL (ref 0.40–1.50)
GFR: 66.65 mL/min (ref >60.00)
Glucose, Bld: 102 mg/dL — ABNORMAL HIGH (ref 70–99)
Potassium: 4.2 mEq/L (ref 3.5–5.1)
Sodium: 139 mEq/L (ref 135–145)
Total Bilirubin: 0.7 mg/dL (ref 0.2–1.2)
Total Protein: 7.1 g/dL (ref 6.0–8.3)

## 2015-12-28 LAB — CBC WITH DIFFERENTIAL/PLATELET
Basophils Absolute: 0 10*3/uL (ref 0.0–0.1)
Basophils Relative: 0.5 % (ref 0.0–3.0)
Eosinophils Absolute: 0.2 10*3/uL (ref 0.0–0.7)
Eosinophils Relative: 3.1 % (ref 0.0–5.0)
HCT: 45.3 % (ref 39.0–52.0)
Hemoglobin: 15.5 g/dL (ref 13.0–17.0)
Lymphocytes Relative: 23 % (ref 12.0–46.0)
Lymphs Abs: 1.6 10*3/uL (ref 0.7–4.0)
MCHC: 34.2 g/dL (ref 30.0–36.0)
MCV: 88.4 fl (ref 78.0–100.0)
Monocytes Absolute: 0.7 10*3/uL (ref 0.1–1.0)
Monocytes Relative: 10.3 % (ref 3.0–12.0)
Neutro Abs: 4.3 10*3/uL (ref 1.4–7.7)
Neutrophils Relative %: 63.1 % (ref 43.0–77.0)
Platelets: 198 10*3/uL (ref 150.0–400.0)
RBC: 5.13 Mil/uL (ref 4.22–5.81)
RDW: 13 % (ref 11.5–15.5)
WBC: 6.9 10*3/uL (ref 4.0–10.5)

## 2015-12-28 LAB — TSH: TSH: 1.37 u[IU]/mL (ref 0.35–4.50)

## 2015-12-28 LAB — HEMOGLOBIN A1C: Hgb A1c MFr Bld: 5.3 % (ref 4.6–6.5)

## 2015-12-28 NOTE — Progress Notes (Signed)
Pre visit review using our clinic review tool, if applicable. No additional management support is needed unless otherwise documented below in the visit note. 

## 2015-12-28 NOTE — Patient Instructions (Signed)

## 2015-12-28 NOTE — Progress Notes (Signed)
Subjective:  Patient ID: Cody Barnes, male    DOB: [birth year]  Age: 55 y.o. MRN: [id number]  CC: Annual Exam   HPI Zaiah Eckerson presents for a CPX.  He is recovering from a recent outbreak of shingles on his left lower abdomen. The rash has resolved and the pain is improving. He is taking the occasional dose of gabapentin for symptom relief. He otherwise feels well and offers no other complaints today.  Outpatient Medications Prior to Visit  Medication Sig Dispense Refill  . gabapentin (NEURONTIN) 100 MG capsule Take 1 capsule (100 mg total) by mouth 3 (three) times daily. 90 capsule 3  . lidocaine (LIDODERM) 5 % Place 1 patch onto the skin daily. Remove & Discard patch within 12 hours or as directed by MD 50 patch 1  . HYDROcodone-acetaminophen (NORCO/VICODIN) 5-325 MG tablet Take 1 tablet by mouth every 6 (six) hours as needed for moderate pain. (Patient not taking: Reported on 12/28/2015) 40 tablet 0  . valACYclovir (VALTREX) 1000 MG tablet Take 1 tablet (1,000 mg total) by mouth 3 (three) times daily. (Patient not taking: Reported on 12/28/2015) 21 tablet 0   No facility-administered medications prior to visit.     ROS Review of Systems  Constitutional: Negative.  Negative for appetite change, chills, fatigue and fever.  HENT: Negative.  Negative for trouble swallowing.   Eyes: Negative.   Respiratory: Negative.  Negative for cough, choking, chest tightness, shortness of breath and stridor.   Cardiovascular: Negative.  Negative for chest pain, palpitations and leg swelling.  Gastrointestinal: Negative.  Negative for abdominal pain, constipation, diarrhea, nausea and vomiting.  Endocrine: Negative.   Genitourinary: Negative.  Negative for difficulty urinating.  Musculoskeletal: Negative.  Negative for back pain and myalgias.  Skin: Negative.  Negative for color change and rash.  Allergic/Immunologic: Negative.   Neurological: Negative.  Negative for dizziness, syncope and  weakness.  Hematological: Negative.  Negative for adenopathy. Does not bruise/bleed easily.  Psychiatric/Behavioral: Negative.     Objective:  BP 132/88 (BP Location: Left Arm, Patient Position: Sitting)   Pulse 68   Temp 98 F (36.7 C) (Oral)   Ht 6' (1.829 m)   Wt 185 lb (83.9 kg)   SpO2 97%   BMI 25.09 kg/m   BP Readings from Last 3 Encounters:  12/28/15 132/88  12/16/15 130/80  09/29/15 120/86    Wt Readings from Last 3 Encounters:  12/28/15 185 lb (83.9 kg)  12/16/15 182 lb (82.6 kg)  09/29/15 186 lb (84.4 kg)    Physical Exam  Constitutional: He is oriented to person, place, and time. He appears well-developed and well-nourished. No distress.  HENT:  Mouth/Throat: Oropharynx is clear and moist. No oropharyngeal exudate.  Eyes: Conjunctivae are normal. Right eye exhibits no discharge. Left eye exhibits no discharge. No scleral icterus.  Neck: Normal range of motion. Neck supple. No JVD present. No tracheal deviation present. No thyromegaly present.  Cardiovascular: Normal rate, regular rhythm, S1 normal, S2 normal and intact distal pulses.  Exam reveals no gallop and no friction rub.   Murmur heard.  Decrescendo systolic murmur is present with a grade of 2/6   No diastolic murmur is present  Pulses:      Carotid pulses are 1+ on the right side, and 1+ on the left side.      Radial pulses are 1+ on the right side, and 1+ on the left side.       Femoral pulses are 1+ on the right  side, and 1+ on the left side.      Popliteal pulses are 1+ on the right side, and 1+ on the left side.       Dorsalis pedis pulses are 1+ on the right side, and 1+ on the left side.       Posterior tibial pulses are 1+ on the right side, and 1+ on the left side.  Pulmonary/Chest: Effort normal and breath sounds normal. No stridor. No respiratory distress. He has no wheezes. He has no rales. He exhibits no tenderness.  Abdominal: Soft. Bowel sounds are normal. He exhibits no distension and no  mass. There is no tenderness. There is no rebound and no guarding.  Genitourinary:  Genitourinary Comments: GU and rectal exams were deferred at his request in [facility] followed every 4-6 months by urology  Musculoskeletal: Normal range of motion. He exhibits no edema, tenderness or deformity.  Lymphadenopathy:    He has no cervical adenopathy.  Neurological: He is oriented to person, place, and time.  Skin: Skin is warm and dry. No rash noted. He is not diaphoretic. No erythema. No pallor.  Psychiatric: He has a normal mood and affect. His behavior is normal. Judgment and thought content normal.  Vitals reviewed.   Lab Results  Component Value Date   WBC 6.9 12/28/2015   HGB 15.5 12/28/2015   HCT 45.3 12/28/2015   PLT 198.0 12/28/2015   GLUCOSE 102 (H) 12/28/2015   CHOL 194 12/28/2015   TRIG 128.0 12/28/2015   HDL 44.20 12/28/2015   LDLCALC 124 (H) 12/28/2015   ALT 64 (H) 12/28/2015   AST 23 12/28/2015   NA 139 12/28/2015   K 4.2 12/28/2015   CL 104 12/28/2015   CREATININE 1.20 12/28/2015   BUN 21 12/28/2015   CO2 28 12/28/2015   TSH 1.37 12/28/2015   PSA 2.77 11/08/2015   INR 1.0 12/10/2013   HGBA1C 5.3 12/28/2015    Ct Renal Stone Study  Result Date: 10/18/2014 CLINICAL DATA:  Left upper flank pain. EXAM: CT ABDOMEN AND PELVIS WITHOUT CONTRAST TECHNIQUE: Multidetector CT imaging of the abdomen and pelvis was performed following the standard protocol without IV contrast. COMPARISON:  None. FINDINGS: Lung bases are clear. Postoperative changes in the heart which are incompletely evaluated on this examination. Negative for free air. Unenhanced CT was performed per clinician order. Lack of IV contrast limits sensitivity and specificity, especially for evaluation of abdominal/pelvic solid viscera. Normal appearance of the gallbladder, liver and spleen. Normal appearance of the pancreas and adrenal glands. No evidence for kidney stones or hydronephrosis. No acute abnormality involving  the kidneys or urinary bladder. Possible low-density cyst in the left kidney upper pole. Normal appearance of the seminal vesicles and prostate. Moderate amount of stool in the colon. Normal appearance of the small bowel and appendix. There appears to be bilateral inguinal hernias containing fat. Difficult to exclude previous surgery in the inguinal areas, particularly on the left side. There is no significant free fluid or lymphadenopathy within the abdomen or pelvis. Internal fixation in the left femoral head and proximal left femur. No acute bone abnormalities. IMPRESSION: No acute abnormalities in the abdomen or pelvis. Specifically, no evidence for kidney stones or hydronephrosis. Electronically Signed   By: Richarda Overlie M.D.   On: 10/18/2014 08:53    Assessment & Plan:   Demico was seen today for annual exam.  Diagnoses and all orders for this visit:  Routine general medical examination at a health care facility- exam completed,  labs ordered and reviewed, vaccines reviewed and updated, his colonoscopy is up-to-date, patient education material was given. -     Lipid panel; Future -     CBC with Differential/Platelet; Future -     TSH; Future -     Comprehensive metabolic panel; Future  Hyperglycemia -     Hemoglobin A1c; Future -     Comprehensive metabolic panel; Future  Need for vaccination for zoster -     Varicella-zoster vaccine subcutaneous   I have discontinued Mr. Guillette HYDROcodone-acetaminophen and valACYclovir. I am also having him maintain his lidocaine and gabapentin.  No orders of the defined types were placed in this encounter.    Follow-up: Return if symptoms worsen or fail to improve.  Sanda Linger, MD

## 2015-12-29 ENCOUNTER — Other Ambulatory Visit: Payer: Self-pay | Admitting: Internal Medicine

## 2015-12-29 DIAGNOSIS — E785 Hyperlipidemia, unspecified: Secondary | ICD-10-CM

## 2015-12-29 MED ORDER — ATORVASTATIN CALCIUM 20 MG PO TABS: 20.0000 mg | ORAL_TABLET | Freq: Every day | ORAL | 3 refills | Status: DC

## 2016-01-25 ENCOUNTER — Telehealth: Payer: Self-pay | Admitting: Internal Medicine

## 2016-01-25 NOTE — Telephone Encounter (Signed)
States insurance has denied shingle shot injection due to age.  It was suggested to patient that he get this injection on last OV due to the fact that he had the shingles.  Spouse would like to know if there is anything that can be done for this to get covered?

## 2016-02-02 NOTE — Telephone Encounter (Signed)
Insurance is requesting medical records to reconsider this claim. Billing will forward the necessary records, they have informed the patient.

## 2016-03-13 ENCOUNTER — Telehealth: Payer: Self-pay | Admitting: Emergency Medicine

## 2016-03-13 NOTE — Telephone Encounter (Signed)
Can you schedule pt please?

## 2016-03-13 NOTE — Telephone Encounter (Signed)
Pt recently had shingles. He is having red and dry spots that itch really bad on the back of his neck and head/scalp. She is wondering if it could possibly be shingles. Does he need to make an office to be seen? Please advise thanks.

## 2016-03-13 NOTE — Telephone Encounter (Signed)
Please advise if OV is needed.

## 2016-03-13 NOTE — Telephone Encounter (Signed)
Needs to be seen

## 2016-03-14 NOTE — Telephone Encounter (Signed)
Got patient scheduled

## 2016-03-15 ENCOUNTER — Encounter: Payer: Self-pay | Admitting: Internal Medicine

## 2016-03-15 ENCOUNTER — Ambulatory Visit (INDEPENDENT_AMBULATORY_CARE_PROVIDER_SITE_OTHER): Payer: 59 | Admitting: Internal Medicine

## 2016-03-15 DIAGNOSIS — R21 Rash and other nonspecific skin eruption: Secondary | ICD-10-CM | POA: Diagnosis not present

## 2016-03-15 MED ORDER — PREDNISONE 10 MG PO TABS: ORAL_TABLET | ORAL | 0 refills | Status: DC

## 2016-03-15 MED ORDER — TRIAMCINOLONE ACETONIDE 0.1 % EX CREA: 1.0000 "application " | TOPICAL_CREAM | Freq: Two times a day (BID) | CUTANEOUS | 0 refills | Status: DC

## 2016-03-15 NOTE — Assessment & Plan Note (Signed)
Mild to mod, consistent with hypersens or allergic, for triam cr prn, predpac asd, to f/u any worsening symptoms or concerns

## 2016-03-15 NOTE — Progress Notes (Signed)
Pre visit review using our clinic review tool, if applicable. No additional management support is needed unless otherwise documented below in the visit note. 

## 2016-03-15 NOTE — Progress Notes (Signed)
Subjective:    Patient ID: Cody Barnes, male    DOB: Jun 28, 1960, 55 y.o.   MRN: [id number]  HPI  Her with acute visit for rash to right upper neck and right occiput with marked itching for 1 wk, no pain or weepiness, does have several erythem lesions nontender, scratching seems to make it worse but cant stop, overall mild, constant, nothing seems to make better or worse Past Medical History:  Diagnosis Date  . Aortic insufficiency   . Elevated PSA 10/12/2014  . 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 Heart murmur   . Mitral regurgitation   . Mitral valve prolapse   . 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 HIP FRACTURE SURGERY     left  . INGUINAL HERNIA REPAIR     as infant  . LEFT AND RIGHT HEART CATHETERIZATION WITH CORONARY ANGIOGRAM N/A 12/12/2013   Procedure: LEFT AND RIGHT HEART CATHETERIZATION WITH CORONARY ANGIOGRAM;  Surgeon: Corky Crafts, MD;  Location: [facility] CATH LAB;  Service: Cardiovascular;  Laterality: N/A;  . TEE WITHOUT CARDIOVERSION N/A 11/25/2013   Procedure: TRANSESOPHAGEAL ECHOCARDIOGRAM (TEE);  Surgeon: Quintella Reichert, MD;  Location: [facility] ENDOSCOPY;  Service: Cardiovascular;  Laterality: N/A;    reports that he has never smoked. He has never used smokeless tobacco. He reports that he drinks about 3.0 oz of alcohol per week . He reports that he does not use drugs. family history includes Cancer in his mother. No Known Allergies Current Outpatient Prescriptions on File Prior to Visit  Medication Sig Dispense Refill  . atorvastatin (LIPITOR) 20 MG tablet Take 1 tablet (20 mg total) by mouth daily. 90 tablet 3  . gabapentin (NEURONTIN) 100 MG capsule Take 1 capsule (100 mg total) by mouth 3 (three) times daily. 90 capsule 3  . lidocaine (LIDODERM) 5 % Place 1 patch onto the skin daily. Remove & Discard patch within 12 hours or as directed by MD 50 patch 1   No current  facility-administered medications on file prior to visit.    Review of Systems  Constitutional: Negative for unusual diaphoresis or night sweats HENT: Negative for ear swelling or discharge Eyes: Negative for worsening visual haziness  Respiratory: Negative for choking and stridor.   Gastrointestinal: Negative for distension or worsening eructation Genitourinary: Negative for retention or change in urine volume.  Musculoskeletal: Negative for other MSK pain or swelling Skin: Negative for color change and worsening wound Neurological: Negative for tremors and numbness other than noted  Psychiatric/Behavioral: Negative for decreased concentration or agitation other than above       Objective:   Physical Exam BP 136/80   Pulse 70   Resp 20   Wt 187 lb (84.8 kg)   SpO2 96%   BMI 25.36 kg/m  VS noted, not ill appearing Constitutional: Pt appears in no apparent distress HENT: Head: NCAT.  Right Ear: External ear normal.  Left Ear: External ear normal.  Eyes: . Pupils are equal, round, and reactive to light. Conjunctivae and EOM are normal Neck: Normal range of motion. Neck supple.  Cardiovascular: Normal rate and regular rhythm.   Pulmonary/Chest: Effort normal and breath sounds without rales or wheezing.  Neurological: Pt is alert. Not confused , motor grossly intact Skin: Skin is warm, no LE edema but with right upper neck and occiput area approx 3 x 4 cme area with numerous slight raised erythem lesions without d/c largest about  5 mm, non tender Psychiatric: Pt behavior is normal. No agitation.         Assessment & Plan:

## 2016-03-15 NOTE — Patient Instructions (Signed)
Please take all new medication as prescribed - the prednisone, and the cream if needed  Please continue all other medications as before, and refills have been done if requested.  Please have the pharmacy call with any other refills you may need.  Please keep your appointments with your specialists as you may have planned

## 2016-04-13 ENCOUNTER — Telehealth: Payer: Self-pay | Admitting: Internal Medicine

## 2016-04-13 NOTE — Telephone Encounter (Signed)
Please schedule patient.

## 2016-04-13 NOTE — Telephone Encounter (Signed)
pls come in for a recheck

## 2016-04-13 NOTE — Telephone Encounter (Signed)
Patient's wife called in stating that he was seen by Dr. Jonny RuizJohn last and was prescribed a cream.  States that the rash is not going away and that it is spreading up his chin.  Would like to know what to try next and if needs a referral to a dermatologist?  Wants to know if this could be some form of shingles?

## 2016-04-14 NOTE — Telephone Encounter (Signed)
Patient's wife is requesting patient to be referred to a specialist.

## 2016-04-14 NOTE — Telephone Encounter (Signed)
Tried to call pt but line is busy

## 2016-04-24 NOTE — Telephone Encounter (Signed)
Tried to call x 4.

## 2016-04-24 NOTE — Telephone Encounter (Signed)
Spoke to spouse. appt scheduled for next week to follow up on skin rash.

## 2016-04-26 ENCOUNTER — Ambulatory Visit: Payer: 59 | Admitting: Internal Medicine

## 2016-05-02 ENCOUNTER — Ambulatory Visit (INDEPENDENT_AMBULATORY_CARE_PROVIDER_SITE_OTHER): Payer: 59 | Admitting: Internal Medicine

## 2016-05-02 ENCOUNTER — Encounter: Payer: Self-pay | Admitting: Internal Medicine

## 2016-05-02 VITALS — BP 130/88 | HR 65 | Temp 98.2°F | Resp 16 | Ht 72.0 in | Wt 187.2 lb

## 2016-05-02 DIAGNOSIS — L219 Seborrheic dermatitis, unspecified: Secondary | ICD-10-CM | POA: Diagnosis not present

## 2016-05-02 DIAGNOSIS — L989 Disorder of the skin and subcutaneous tissue, unspecified: Secondary | ICD-10-CM

## 2016-05-02 DIAGNOSIS — L240 Irritant contact dermatitis due to detergents: Secondary | ICD-10-CM

## 2016-05-02 MED ORDER — DESONIDE 0.05 % EX LOTN: TOPICAL_LOTION | Freq: Two times a day (BID) | CUTANEOUS | 2 refills | Status: DC

## 2016-05-02 MED ORDER — CICLOPIROX 1 % EX SHAM: 1.0000 | MEDICATED_SHAMPOO | Freq: Every day | CUTANEOUS | 2 refills | Status: DC

## 2016-05-02 MED ORDER — BETAMETHASONE VALERATE 0.12 % EX FOAM: 1.0000 | Freq: Two times a day (BID) | CUTANEOUS | 2 refills | Status: DC

## 2016-05-02 NOTE — Progress Notes (Signed)
Pre visit review using our clinic review tool, if applicable. No additional management support is needed unless otherwise documented below in the visit note. 

## 2016-05-02 NOTE — Patient Instructions (Signed)
Seborrheic Dermatitis, Adult Seborrheic dermatitis is a skin disease that causes red, scaly patches. It usually occurs on the scalp, and it is often called dandruff. The patches may appear on other parts of the body. Skin patches tend to appear where there are many oil glands in the skin. Areas of the body that are commonly affected include:  Scalp.  Skin folds of the body.  Ears.  Eyebrows.  Neck.  Face.  Armpits.  The bearded area of men's faces. The condition may come and go for no known reason, and it is often long-lasting (chronic). What are the causes? The cause of this condition is not known. What increases the risk? This condition is more likely to develop in people who:  Have certain conditions, such as:  HIV (human immunodeficiency virus).  AIDS (acquired immunodeficiency syndrome).  Parkinson disease.  Mood disorders, such as depression.  Are 40-60 years old. What are the signs or symptoms? Symptoms of this condition include:  Thick scales on the scalp.  Redness on the face or in the armpits.  Skin that is flaky. The flakes may be white or yellow.  Skin that seems oily or dry but is not helped with moisturizers.  Itching or burning in the affected areas. How is this diagnosed? This condition is diagnosed with a medical history and physical exam. A sample of your skin may be tested (skin biopsy). You may need to see a skin specialist (dermatologist). How is this treated? There is no cure for this condition, but treatment can help to manage the symptoms. You may get treatment to remove scales, lower the risk of skin infection, and reduce swelling or itching. Treatment may include:  Creams that reduce swelling and irritation (steroids).  Creams that reduce skin yeast.  Medicated shampoo, soaps, moisturizing creams, or ointments.  Medicated moisturizing creams or ointments. Follow these instructions at home:  Apply over-the-counter and prescription  medicines only as told by your health care provider.  Use any medicated shampoo, soaps, skin creams, or ointments only as told by your health care provider.  Keep all follow-up visits as told by your health care provider. This is important. Contact a health care provider if:  Your symptoms do not improve with treatment.  Your symptoms get worse.  You have new symptoms. This information is not intended to replace advice given to you by your health care provider. Make sure you discuss any questions you have with your health care provider. Document Released: 05/15/2005 Document Revised: 12/03/2015 Document Reviewed: 09/02/2015 Elsevier Interactive Patient Education  2017 [employer]  

## 2016-05-02 NOTE — Progress Notes (Signed)
Subjective:  Patient ID: Cody Barnes, male    DOB: April 15, 1961  Age: 55 y.o. MRN: [id number]  CC: Rash   HPI Cody Barnes presents for concerns about his skin on his scalp. He has a fleshy lesion on the left superior scalp that he is concerned about. He also has a chronic, itchy rash on his scalp, more prominently on the right than the left, and posterior. He has a rash under his chin which he describes as diffuse redness and itchiness. He has tried multiple over-the-counter remedies for the rash on his scalp and the redness and itching on his chin. He has had no improvement in his symptoms.  Outpatient Medications Prior to Visit  Medication Sig Dispense Refill  . atorvastatin (LIPITOR) 20 MG tablet Take 1 tablet (20 mg total) by mouth daily. 90 tablet 3  . gabapentin (NEURONTIN) 100 MG capsule Take 1 capsule (100 mg total) by mouth 3 (three) times daily. (Patient not taking: Reported on 05/02/2016) 90 capsule 3  . lidocaine (LIDODERM) 5 % Place 1 patch onto the skin daily. Remove & Discard patch within 12 hours or as directed by MD (Patient not taking: Reported on 05/02/2016) 50 patch 1  . triamcinolone cream (KENALOG) 0.1 % Apply 1 application topically 2 (two) times daily. (Patient not taking: Reported on 05/02/2016) 30 g 0  . predniSONE (DELTASONE) 10 MG tablet 2 tabs by mouth per day for 5 days 10 tablet 0   No facility-administered medications prior to visit.     ROS Review of Systems  HENT: Negative.   Eyes: Negative.   Respiratory: Negative.   Cardiovascular: Negative.   Gastrointestinal: Negative.   All other systems reviewed and are negative.   Objective:  BP 130/88 (BP Location: Left Arm, Patient Position: Sitting, Cuff Size: Normal)   Pulse 65   Temp 98.2 F (36.8 C) (Oral)   Resp 16   Ht 6' (1.829 m)   Wt 187 lb 4 oz (84.9 kg)   SpO2 96%   BMI 25.40 kg/m   BP Readings from Last 3 Encounters:  05/02/16 130/88  03/15/16 136/80  12/28/15 132/88    Wt  Readings from Last 3 Encounters:  05/02/16 187 lb 4 oz (84.9 kg)  03/15/16 187 lb (84.8 kg)  12/28/15 185 lb (83.9 kg)    Physical Exam  HENT:  Head:      Lab Results  Component Value Date   WBC 6.9 12/28/2015   HGB 15.5 12/28/2015   HCT 45.3 12/28/2015   PLT 198.0 12/28/2015   GLUCOSE 102 (H) 12/28/2015   CHOL 194 12/28/2015   TRIG 128.0 12/28/2015   HDL 44.20 12/28/2015   LDLCALC 124 (H) 12/28/2015   ALT 64 (H) 12/28/2015   AST 23 12/28/2015   NA 139 12/28/2015   K 4.2 12/28/2015   CL 104 12/28/2015   CREATININE 1.20 12/28/2015   BUN 21 12/28/2015   CO2 28 12/28/2015   TSH 1.37 12/28/2015   PSA 2.77 11/08/2015   INR 1.0 12/10/2013   HGBA1C 5.3 12/28/2015    Ct Renal Stone Study  Result Date: 10/18/2014 CLINICAL DATA:  Left upper flank pain. EXAM: CT ABDOMEN AND PELVIS WITHOUT CONTRAST TECHNIQUE: Multidetector CT imaging of the abdomen and pelvis was performed following the standard protocol without IV contrast. COMPARISON:  None. FINDINGS: Lung bases are clear. Postoperative changes in the heart which are incompletely evaluated on this examination. Negative for free air. Unenhanced CT was performed per clinician order. Lack of  IV contrast limits sensitivity and specificity, especially for evaluation of abdominal/pelvic solid viscera. Normal appearance of the gallbladder, liver and spleen. Normal appearance of the pancreas and adrenal glands. No evidence for kidney stones or hydronephrosis. No acute abnormality involving the kidneys or urinary bladder. Possible low-density cyst in the left kidney upper pole. Normal appearance of the seminal vesicles and prostate. Moderate amount of stool in the colon. Normal appearance of the small bowel and appendix. There appears to be bilateral inguinal hernias containing fat. Difficult to exclude previous surgery in the inguinal areas, particularly on the left side. There is no significant free fluid or lymphadenopathy within the abdomen  or pelvis. Internal fixation in the left femoral head and proximal left femur. No acute bone abnormalities. IMPRESSION: No acute abnormalities in the abdomen or pelvis. Specifically, no evidence for kidney stones or hydronephrosis. Electronically Signed   By: Richarda OverlieAdam  Henn M.D.   On: 10/18/2014 08:53    Assessment & Plan:   Cody Barnes was seen today for rash.  Diagnoses and all orders for this visit:  Seborrheic dermatitis of scalp- will treat this infection with ciclopirox shampoo and will control the itching with betamethasone foam -     Ciclopirox (LOPROX) 1 % shampoo; Apply 1 Act topically at bedtime. Shampoo scalp QD with this -     Betamethasone Valerate 0.12 % foam; Apply 1 Act topically 2 (two) times daily. 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 -     desonide (DESOWEN) 0.05 % lotion; Apply topically 2 (two) times daily. Apply to rash on chin/neck BID -     Ambulatory referral to Dermatology  Skin lesion of scalp- this lesion is concerning for basal cell carcinoma or squamous cell carcinoma so I have asked him to see dermatology. -     Ambulatory referral to Dermatology   I have discontinued Cody Barnes's predniSONE. I am also having him start on Ciclopirox, Betamethasone Valerate, and desonide. Additionally, I am having him maintain his lidocaine, gabapentin, atorvastatin, and triamcinolone cream.  Meds ordered this encounter  Medications  . Ciclopirox (LOPROX) 1 % shampoo    Sig: Apply 1 Act topically at bedtime. Shampoo scalp QD with this    Dispense:  120 mL    Refill:  2  . Betamethasone Valerate 0.12 % foam    Sig: Apply 1 Act topically 2 (two) times daily. Apply to scalp BID    Dispense:  100 g    Refill:  2  . desonide (DESOWEN) 0.05 % lotion     Sig: Apply topically 2 (two) times daily. Apply to rash on chin/neck BID    Dispense:  59 mL    Refill:  2     Follow-up: Return in about 2 months (around 07/03/2016).  Sanda Lingerhomas Vonnie Spagnolo, MD

## 2016-05-08 ENCOUNTER — Telehealth: Payer: Self-pay | Admitting: Internal Medicine

## 2016-05-08 NOTE — Telephone Encounter (Signed)
Please work dermatology referral today and contact patient wife to advise of status.

## 2016-05-08 NOTE — Telephone Encounter (Signed)
Pt scheduled with [facility] with Dr Margo AyeHall office on 05/15/16  @ 830 am.  Pt aware.

## 2016-05-09 ENCOUNTER — Telehealth: Payer: Self-pay

## 2016-05-09 NOTE — Telephone Encounter (Signed)
Fax from pharmacy states that the betamethasone foam is not covered. Request for alternative. Please advise.

## 2016-05-10 NOTE — Telephone Encounter (Signed)
This is a generic How much does it cost?

## 2016-05-11 ENCOUNTER — Other Ambulatory Visit: Payer: Self-pay | Admitting: Internal Medicine

## 2016-05-11 DIAGNOSIS — L219 Seborrheic dermatitis, unspecified: Secondary | ICD-10-CM

## 2016-05-11 MED ORDER — CLOBETASOL PROPIONATE 0.05 % EX FOAM: Freq: Two times a day (BID) | CUTANEOUS | 2 refills | Status: DC

## 2016-05-11 NOTE — Telephone Encounter (Signed)
changed

## 2016-05-11 NOTE — Telephone Encounter (Signed)
Pt informed of same.  

## 2016-05-11 NOTE — Telephone Encounter (Signed)
Spoke to spouse and the medication was $400.00.   Spoke to pharmacy and they do have other steroid type foams.   Please advise if there is a less expensive alternative.

## 2016-09-12 ENCOUNTER — Encounter: Payer: Self-pay | Admitting: Interventional Cardiology

## 2016-09-26 ENCOUNTER — Telehealth: Payer: Self-pay

## 2016-09-26 DIAGNOSIS — E785 Hyperlipidemia, unspecified: Secondary | ICD-10-CM

## 2016-09-26 MED ORDER — ATORVASTATIN CALCIUM 20 MG PO TABS: 20.0000 mg | ORAL_TABLET | Freq: Every day | ORAL | 0 refills | Status: DC

## 2016-09-26 NOTE — Telephone Encounter (Signed)
Pt needs an appt for refills.  

## 2016-10-01 NOTE — Progress Notes (Signed)
Patient ID: Cody Barnes, male   DOB: 08/08/1960, 56 y.o.   MRN: [id number]     Cardiology Office Note   Date:  10/02/2016   ID:  Cody MuseStuart Barnes, DOB 08/08/1960, MRN [id number]  PCP:  Etta GrandchildJones, Thomas L, MD    No chief complaint on file. f/u AVR/MVR   Wt Readings from Last 3 Encounters:  10/02/16 185 lb 12.8 oz (84.3 kg)  05/02/16 187 lb 4 oz (84.9 kg)  03/15/16 187 lb (84.8 kg)       History of Present Illness: Cody Barnes is a 56 y.o. male  who has had mitral valve prolapse for many years. He was followed with serial echoes. He was noted to have increased LV size at the time of his last echocardiogram in 2015 with some symptoms of dyspnea on exertion despite preserved LVEF. He was referred for surgery. [facility]. He underwent mitral valve repair and aortic valve replacement due to aortic insufficiency as well. He chose to have a bioprosthetic aortic valve. Cardiac cath prior to the valve surgery was negative for significant coronary artery disease.  He has done well since his surgery. When the weather is good, he exercises by walking, 3-4 days 45-60 minutes at a time.  In cold weather, treadmill 2-3x/week 30-45 minutes- flat.   No chest pain or shortness of breath with walking.  Rarely plays golf Due to time constraints.    He was started on atorvastatin for elevated cholesterol. Of note, he had a mildly increased ALT before starting the medicine. He does not think labs have been checked since starting the medicine.    Past Medical History:  Diagnosis Date  . Aortic insufficiency   . Elevated PSA 10/12/2014  . 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 Heart murmur   . Mitral regurgitation   . Mitral valve prolapse   . 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 HIP FRACTURE SURGERY     left  . INGUINAL HERNIA REPAIR     as infant  . LEFT AND RIGHT HEART  CATHETERIZATION WITH CORONARY ANGIOGRAM N/A 12/12/2013   Procedure: LEFT AND RIGHT HEART CATHETERIZATION WITH CORONARY ANGIOGRAM;  Surgeon: Corky CraftsJayadeep S Delsie Amador, MD;  [facility];  Service: Cardiovascular;  Laterality: N/A;  . TEE WITHOUT CARDIOVERSION N/A 11/25/2013   Procedure: TRANSESOPHAGEAL ECHOCARDIOGRAM (TEE);  Surgeon: Quintella Reichertraci R Turner, MD;  [facility];  Service: Cardiovascular;  Laterality: N/A;     Current Outpatient Prescriptions  Medication Sig Dispense Refill  . amoxicillin (AMOXIL) 500 MG capsule Take 500 mg by mouth as directed. Only used when going to the dentist    . atorvastatin (LIPITOR) 20 MG tablet Take 1 tablet (20 mg total) by mouth daily. 90 tablet 0   No current facility-administered medications for this visit.     Allergies:   Patient has no known allergies.    Social History:  The patient  reports that he has never smoked. He has never used smokeless tobacco. He reports that he drinks about 3.0 oz of alcohol per week . He reports that he does not use drugs.   Family History:  The patient's family history includes Cancer in his mother.    ROS:  Please see the history of present illness.   Otherwise, review of systems are positive for mild DOE.   All other systems are reviewed and negative.  PHYSICAL EXAM: VS:  BP 112/80   Pulse 70   Ht 6' (1.829 m)   Wt 185 lb 12.8 oz (84.3 kg)   SpO2 97%   BMI 25.20 kg/m  , BMI Body mass index is 25.2 kg/m. GEN: Well nourished, well developed, in no acute distress  HEENT: normal  Neck: no JVD, carotid bruits, or masses Cardiac: RRR; 2/6 early systolic murmur murmurs, rubs, or gallops,no edema  Respiratory:  clear to auscultation bilaterally, normal work of breathing GI: soft, nontender, nondistended, + BS MS: no deformity or atrophy  Skin: warm and dry, no rash Neuro:  Strength and sensation are intact Psych: euthymic mood, full affect   EKG:   The ekg ordered today demonstrates NSR, LAD,  non specific ST segment changes   Recent Labs: 12/28/2015: ALT 64; BUN 21; Creatinine, Ser 1.20; Hemoglobin 15.5; Platelets 198.0; Potassium 4.2; Sodium 139; TSH 1.37   Lipid Panel    Component Value Date/Time   CHOL 194 12/28/2015 0918   TRIG 128.0 12/28/2015 0918   HDL 44.20 12/28/2015 0918   CHOLHDL 4 12/28/2015 0918   VLDL 25.6 12/28/2015 0918   LDLCALC 124 (H) 12/28/2015 0918     Other studies Reviewed: Additional studies/ records that were reviewed today with results demonstrating: last echo from 2017 May.   ASSESSMENT AND PLAN:  1. Mitral valve repair:Status post mitral valve repair. He needs SBE prophylaxis. No signs of heart failure.  Echo in 2017 reviewed showing low normal LV function with normal prosthetic valve function 2. Aortic valve replacement: murmur on exam, related to prosthetic aortic valve.   Continue SBE prophylaxis. 3. No CAD on cath in 2015.:  Started on atorvastatin.   4. Elevated LFT (ALT): Check labs now that he is on atorvastatin.   Current medicines are reviewed at length with the patient today.  The patient concerns regarding his medicines were addressed.  The following changes have been made:  No change  Labs/ tests ordered today include:  No orders of the defined types were placed in this encounter.   Recommend 150 minutes/week of aerobic exercise Low fat, low carb, high fiber diet recommended  Disposition:   FU in 1 year   Signed, Lance Muss, MD  10/02/2016 8:20 AM    [facility] [address] [place], [place], [place]  [address] Phone: [phone]; Fax: [phone]

## 2016-10-02 ENCOUNTER — Encounter: Payer: Self-pay | Admitting: Interventional Cardiology

## 2016-10-02 ENCOUNTER — Ambulatory Visit (INDEPENDENT_AMBULATORY_CARE_PROVIDER_SITE_OTHER): Payer: Managed Care, Other (non HMO) | Admitting: Interventional Cardiology

## 2016-10-02 VITALS — BP 112/80 | HR 70 | Ht 72.0 in | Wt 185.8 lb

## 2016-10-02 DIAGNOSIS — I340 Nonrheumatic mitral (valve) insufficiency: Secondary | ICD-10-CM

## 2016-10-02 DIAGNOSIS — I351 Nonrheumatic aortic (valve) insufficiency: Secondary | ICD-10-CM | POA: Diagnosis not present

## 2016-10-02 DIAGNOSIS — R7989 Other specified abnormal findings of blood chemistry: Secondary | ICD-10-CM | POA: Insufficient documentation

## 2016-10-02 DIAGNOSIS — R945 Abnormal results of liver function studies: Secondary | ICD-10-CM

## 2016-10-02 DIAGNOSIS — E785 Hyperlipidemia, unspecified: Secondary | ICD-10-CM

## 2016-10-02 DIAGNOSIS — Z953 Presence of xenogenic heart valve: Secondary | ICD-10-CM | POA: Diagnosis not present

## 2016-10-02 NOTE — Patient Instructions (Signed)
Medication Instructions:  None  Labwork: Your physician recommends that you return for lab work when you are fasting (CMET, Lipids)   Testing/Procedures: None  Follow-Up: Your physician wants you to follow-up in: 1 year with Dr. Varanasi.  You will receive a reminder letter in the mail two months in advance. If you don't receive a letter, please call our office to schedule the follow-up appointment.   Any Other Special Instructions Will Be Listed Below (If Applicable).     If you need a refill on your cardiac medications before your next appointment, please call your pharmacy.   

## 2016-10-04 ENCOUNTER — Other Ambulatory Visit: Payer: Managed Care, Other (non HMO) | Admitting: *Deleted

## 2016-10-04 DIAGNOSIS — Z953 Presence of xenogenic heart valve: Secondary | ICD-10-CM

## 2016-10-04 DIAGNOSIS — R7989 Other specified abnormal findings of blood chemistry: Secondary | ICD-10-CM

## 2016-10-04 DIAGNOSIS — E785 Hyperlipidemia, unspecified: Secondary | ICD-10-CM

## 2016-10-04 DIAGNOSIS — R945 Abnormal results of liver function studies: Secondary | ICD-10-CM

## 2016-10-04 DIAGNOSIS — Z0000 Encounter for general adult medical examination without abnormal findings: Secondary | ICD-10-CM

## 2016-10-04 DIAGNOSIS — Z9889 Other specified postprocedural states: Secondary | ICD-10-CM

## 2016-10-04 DIAGNOSIS — R739 Hyperglycemia, unspecified: Secondary | ICD-10-CM

## 2016-10-04 LAB — COMPREHENSIVE METABOLIC PANEL
ALT: 28 IU/L (ref 0–44)
AST: 21 IU/L (ref 0–40)
Albumin/Globulin Ratio: 2.3 — ABNORMAL HIGH (ref 1.2–2.2)
Albumin: 4.5 g/dL (ref 3.5–5.5)
Alkaline Phosphatase: 88 IU/L (ref 39–117)
BUN/Creatinine Ratio: 18 (ref 9–20)
BUN: 18 mg/dL (ref 6–24)
Bilirubin Total: 0.6 mg/dL (ref 0.0–1.2)
CO2: 22 mmol/L (ref 18–29)
Calcium: 9 mg/dL (ref 8.7–10.2)
Chloride: 100 mmol/L (ref 96–106)
Creatinine, Ser: 1.02 mg/dL (ref 0.76–1.27)
GFR calc Af Amer: 95 mL/min/{1.73_m2} (ref >59)
GFR calc non Af Amer: 82 mL/min/{1.73_m2} (ref >59)
Globulin, Total: 2 g/dL (ref 1.5–4.5)
Glucose: 97 mg/dL (ref 65–99)
Potassium: 4.7 mmol/L (ref 3.5–5.2)
Sodium: 140 mmol/L (ref 134–144)
Total Protein: 6.5 g/dL (ref 6.0–8.5)

## 2016-10-04 LAB — LIPID PANEL
Chol/HDL Ratio: 2.8 ratio (ref 0.0–5.0)
Cholesterol, Total: 114 mg/dL (ref 100–199)
HDL: 41 mg/dL (ref >39)
LDL Calculated: 61 mg/dL (ref 0–99)
Triglycerides: 61 mg/dL (ref 0–149)
VLDL Cholesterol Cal: 12 mg/dL (ref 5–40)

## 2016-10-04 NOTE — Addendum Note (Signed)
Addended by: Tonita PhoenixBOWDEN, ROBIN K on: 10/04/2016 07:57 AM   Modules accepted: Orders

## 2016-10-04 NOTE — Progress Notes (Signed)
[facility]

## 2016-12-15 LAB — PSA: PSA: 1.8

## 2016-12-26 ENCOUNTER — Encounter: Payer: Self-pay | Admitting: Internal Medicine

## 2016-12-26 NOTE — Progress Notes (Unsigned)
Results entered and sent to scan  

## 2017-01-03 ENCOUNTER — Encounter: Payer: Self-pay | Admitting: Internal Medicine

## 2017-01-03 ENCOUNTER — Other Ambulatory Visit (INDEPENDENT_AMBULATORY_CARE_PROVIDER_SITE_OTHER): Payer: Managed Care, Other (non HMO)

## 2017-01-03 ENCOUNTER — Ambulatory Visit (INDEPENDENT_AMBULATORY_CARE_PROVIDER_SITE_OTHER): Payer: Managed Care, Other (non HMO) | Admitting: Internal Medicine

## 2017-01-03 VITALS — BP 134/84 | HR 57 | Temp 97.9°F | Resp 16 | Ht 72.0 in | Wt 176.2 lb

## 2017-01-03 DIAGNOSIS — Z0000 Encounter for general adult medical examination without abnormal findings: Secondary | ICD-10-CM

## 2017-01-03 DIAGNOSIS — Z1159 Encounter for screening for other viral diseases: Secondary | ICD-10-CM

## 2017-01-03 LAB — TSH: TSH: 1.7 u[IU]/mL (ref 0.35–4.50)

## 2017-01-03 LAB — CBC WITH DIFFERENTIAL/PLATELET
Basophils Absolute: 0.1 10*3/uL (ref 0.0–0.1)
Basophils Relative: 0.9 % (ref 0.0–3.0)
Eosinophils Absolute: 0.2 10*3/uL (ref 0.0–0.7)
Eosinophils Relative: 3.4 % (ref 0.0–5.0)
HCT: 46.3 % (ref 39.0–52.0)
Hemoglobin: 15.5 g/dL (ref 13.0–17.0)
Lymphocytes Relative: 21.7 % (ref 12.0–46.0)
Lymphs Abs: 1.3 10*3/uL (ref 0.7–4.0)
MCHC: 33.6 g/dL (ref 30.0–36.0)
MCV: 90.8 fl (ref 78.0–100.0)
Monocytes Absolute: 0.6 10*3/uL (ref 0.1–1.0)
Monocytes Relative: 10.9 % (ref 3.0–12.0)
Neutro Abs: 3.7 10*3/uL (ref 1.4–7.7)
Neutrophils Relative %: 63.1 % (ref 43.0–77.0)
Platelets: 191 10*3/uL (ref 150.0–400.0)
RBC: 5.1 Mil/uL (ref 4.22–5.81)
RDW: 13 % (ref 11.5–15.5)
WBC: 5.9 10*3/uL (ref 4.0–10.5)

## 2017-01-03 NOTE — Progress Notes (Signed)
Subjective:  Patient ID: Cody Barnes, male    DOB: 03/08/1961  Age: 56 y.o. MRN: [id number]  CC: Annual Exam   HPI Cody Barnes presents for a CPX.  He feels well and offers no complaints. He saw his urologist a few weeks ago - no problems were noted. He walks about 5 miles a day and has good endurance. He's had no recent episodes of chest pain, DOE, SOB, palpitations, edema, or fatigue.  Outpatient Medications Prior to Visit  Medication Sig Dispense Refill  . amoxicillin (AMOXIL) 500 MG capsule Take 500 mg by mouth as directed. Only used when going to the dentist    . atorvastatin (LIPITOR) 20 MG tablet Take 1 tablet (20 mg total) by mouth daily. 90 tablet 0   No facility-administered medications prior to visit.     ROS Review of Systems  Constitutional: Negative.  Negative for appetite change, diaphoresis, fatigue and unexpected weight change.  HENT: Negative.  Negative for trouble swallowing.   Eyes: Negative for visual disturbance.  Respiratory: Negative.  Negative for apnea, cough, shortness of breath and wheezing.   Cardiovascular: Negative for chest pain, palpitations and leg swelling.  Gastrointestinal: Negative.  Negative for abdominal pain, blood in stool, constipation, diarrhea, nausea and vomiting.  Genitourinary: Negative.   Musculoskeletal: Negative.  Negative for arthralgias and myalgias.  Skin: Negative.   Allergic/Immunologic: Negative.   Neurological: Negative.  Negative for dizziness.  Hematological: Negative for adenopathy. Does not bruise/bleed easily.  Psychiatric/Behavioral: Negative.     Objective:  BP 134/84 (BP Location: Left Arm, Patient Position: Sitting, Cuff Size: Normal)   Pulse (!) 57   Temp 97.9 F (36.6 C) (Oral)   Resp 16   Ht 6' (1.829 m)   Wt 176 lb 4 oz (79.9 kg)   SpO2 94%   BMI 23.90 kg/m   BP Readings from Last 3 Encounters:  01/03/17 134/84  10/02/16 112/80  05/02/16 130/88    Wt Readings from Last 3 Encounters:    01/03/17 176 lb 4 oz (79.9 kg)  10/02/16 185 lb 12.8 oz (84.3 kg)  05/02/16 187 lb 4 oz (84.9 kg)    Physical Exam  Constitutional: He is oriented to person, place, and time. No distress.  HENT:  Mouth/Throat: Oropharynx is clear and moist. No oropharyngeal exudate.  Eyes: Conjunctivae are normal. Right eye exhibits no discharge. Left eye exhibits no discharge. No scleral icterus.  Neck: Normal range of motion. Neck supple. No JVD present. No thyromegaly present.  Cardiovascular: Normal rate, regular rhythm, S1 normal, S2 normal and intact distal pulses.  Exam reveals no gallop and no friction rub.   Murmur heard.  Systolic murmur is present with a grade of 1/6   No diastolic murmur is present  1/6 rumbling syst murmur  Pulmonary/Chest: Effort normal and breath sounds normal. No respiratory distress. He has no wheezes. He has no rales. He exhibits no tenderness.  Abdominal: Soft. Bowel sounds are normal. He exhibits no distension and no mass. There is no tenderness. There is no rebound and no guarding.  Genitourinary:  Genitourinary Comments: GU/rectal exam deferred since this was recently done by Urology  Musculoskeletal: Normal range of motion. He exhibits no edema, tenderness or deformity.  Neurological: He is alert and oriented to person, place, and time.  Skin: Skin is warm and dry. No rash noted. He is not diaphoretic. No erythema. No pallor.  Vitals reviewed.   Lab Results  Component Value Date   WBC 5.9 01/03/2017  HGB 15.5 01/03/2017   HCT 46.3 01/03/2017   PLT 191.0 01/03/2017   GLUCOSE 97 10/04/2016   CHOL 114 10/04/2016   TRIG 61 10/04/2016   HDL 41 10/04/2016   LDLCALC 61 10/04/2016   ALT 28 10/04/2016   AST 21 10/04/2016   NA 140 10/04/2016   K 4.7 10/04/2016   CL 100 10/04/2016   CREATININE 1.02 10/04/2016   BUN 18 10/04/2016   CO2 22 10/04/2016   TSH 1.70 01/03/2017   PSA 1.8 12/15/2016   INR 1.0 12/10/2013   HGBA1C 5.3 12/28/2015    Ct Renal  Stone Study  Result Date: 10/18/2014 CLINICAL DATA:  Left upper flank pain. EXAM: CT ABDOMEN AND PELVIS WITHOUT CONTRAST TECHNIQUE: Multidetector CT imaging of the abdomen and pelvis was performed following the standard protocol without IV contrast. COMPARISON:  None. FINDINGS: Lung bases are clear. Postoperative changes in the heart which are incompletely evaluated on this examination. Negative for free air. Unenhanced CT was performed per clinician order. Lack of IV contrast limits sensitivity and specificity, especially for evaluation of abdominal/pelvic solid viscera. Normal appearance of the gallbladder, liver and spleen. Normal appearance of the pancreas and adrenal glands. No evidence for kidney stones or hydronephrosis. No acute abnormality involving the kidneys or urinary bladder. Possible low-density cyst in the left kidney upper pole. Normal appearance of the seminal vesicles and prostate. Moderate amount of stool in the colon. Normal appearance of the small bowel and appendix. There appears to be bilateral inguinal hernias containing fat. Difficult to exclude previous surgery in the inguinal areas, particularly on the left side. There is no significant free fluid or lymphadenopathy within the abdomen or pelvis. Internal fixation in the left femoral head and proximal left femur. No acute bone abnormalities. IMPRESSION: No acute abnormalities in the abdomen or pelvis. Specifically, no evidence for kidney stones or hydronephrosis. Electronically Signed   By: Richarda OverlieAdam  Henn M.D.   On: 10/18/2014 08:53    Assessment & Plan:   Cody Barnes was seen today for annual exam.  Diagnoses and all orders for this visit:  Encounter for hepatitis C screening test for low risk patient -     Hepatitis C antibody; Future  Routine general medical examination at a health care facility- exam completed, labs reviewed, vaccines reviewed, colon cancer screening is up-to-date, patient education material was given. -     CBC  with Differential/Platelet; Future -     TSH; Future   I am having Cody Barnes maintain his atorvastatin and amoxicillin.  No orders of the defined types were placed in this encounter.    Follow-up: Return if symptoms worsen or fail to improve.  Sanda Lingerhomas Dynesha Woolen, MD

## 2017-01-03 NOTE — Patient Instructions (Signed)

## 2017-01-04 ENCOUNTER — Encounter: Payer: Self-pay | Admitting: Internal Medicine

## 2017-01-04 LAB — HEPATITIS C ANTIBODY: HCV Ab: NONREACTIVE

## 2017-01-12 ENCOUNTER — Other Ambulatory Visit: Payer: Self-pay | Admitting: Internal Medicine

## 2017-01-12 DIAGNOSIS — E785 Hyperlipidemia, unspecified: Secondary | ICD-10-CM

## 2017-07-25 ENCOUNTER — Other Ambulatory Visit: Payer: Self-pay | Admitting: Internal Medicine

## 2017-07-25 DIAGNOSIS — E785 Hyperlipidemia, unspecified: Secondary | ICD-10-CM

## 2017-10-05 NOTE — Progress Notes (Signed)
Cardiology Office Note   Date:  10/08/2017   ID:  Cody Barnes, DOB 1960/10/21, MRN [id number]  PCP:  Etta Grandchild, MD    No chief complaint on file.  MV repair/AVR  Wt Readings from Last 3 Encounters:  10/08/17 183 lb (83 kg)  01/03/17 176 lb 4 oz (79.9 kg)  10/02/16 185 lb 12.8 oz (84.3 kg)       History of Present Illness: Cody Barnes is a 57 y.o. male  who has had mitral valve prolapse for many years. He was followed with serial echoes. He was noted to have increased LV size at the time of his last echocardiogram in 2015 with some symptoms of dyspnea on exertion despite preserved LVEF. He was referred for surgery. He chose to go to [facility] clinic for his operation. He underwent mitral valve repair and aortic valve replacement due to aortic insufficiency as well. He chose to have a bioprosthetic aortic valve. Cardiac cath prior to the valve surgery was negative for significant coronary artery disease.  He was started on atorvastatin for elevated cholesterol. Of note, he had a mildly increased ALT before starting the medicine.  Since the last visit, he has noticed some SHOB with walking up stairs and walking up hills.  He walks 2-4 miles/day when the weather cooperates.  Occasional ankle edema at the end of the day.  Denies : Chest pain. Dizziness.  Nitroglycerin use. Orthopnea. Palpitations. Paroxysmal nocturnal dyspnea. Syncope.   He uses antibiotics before dental cleaning, 3x/year.  Past Medical History:  Diagnosis Date  . Aortic insufficiency   . Elevated PSA 10/12/2014  . 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 Heart murmur   . Mitral regurgitation   . Mitral valve prolapse   . 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 HIP FRACTURE SURGERY     left  . INGUINAL HERNIA REPAIR     as infant  . LEFT AND RIGHT HEART CATHETERIZATION WITH CORONARY ANGIOGRAM N/A 12/12/2013   Procedure: LEFT AND RIGHT HEART CATHETERIZATION WITH CORONARY ANGIOGRAM;  Surgeon: Corky Crafts, MD;  Location[facility] C[facility] LAB;  Service: Cardiovascular;  Laterality: N/A;  . TEE WITHOUT CARDIOVERSION N/A 11/25/2013   Procedure: TRANSESOPHAGEAL ECHOCARDIOGRAM (TEE);  Surgeon: Quintella Reichert, MD;  Location: [facility] ENDOSCOPY;  Service: Cardiovascular;  Laterality: N/A;     Current Outpatient Medications  Medication Sig Dispense Refill  . amoxicillin (AMOXIL) 500 MG capsule Take 500 mg by mouth as directed. Only used when going to the dentist    . atorvastatin (LIPITOR) 20 MG tablet TAKE 1 TABLET BY MOUTH EVERY DAY 90 tablet 1   No current facility-administered medications for this visit.     Allergies:   Patient has no known allergies.    Social History:  The patient  reports that he has never smoked. He has never used smokeless tobacco. He reports that he drinks about 3.0 oz of alcohol per week. He reports that he does not use drugs.   Family History:  The patient's family history includes Cancer in his mother.    ROS:  Please see the history of present illness.   Otherwise, review of systems are positive for mild DOE as he gets back to walking.   All other systems are reviewed and negative.    PHYSICAL EXAM: VS:  BP 122/82   Pulse (!) 58   Ht 6' (1.829 m)  Wt 183 lb (83 kg)   SpO2 96%   BMI 24.82 kg/m  , BMI Body mass index is 24.82 kg/m. GEN: Well nourished, well developed, in no acute distress  HEENT: normal  Neck: no JVD, carotid bruits, or masses Cardiac: RRR; 2/6 systolic murmur, no rubs, or gallops,no edema  Respiratory:  clear to auscultation bilaterally, normal work of breathing GI: soft, nontender, nondistended, + BS MS: no deformity or atrophy  Skin: warm and dry, no rash Neuro:  Strength and sensation are intact Psych: euthymic mood, full affect   EKG:   The ekg ordered today demonstrates sinus bradycardia, nonspecific ST changes   Recent Labs: 01/03/2017:  Hemoglobin 15.5; Platelets 191.0; TSH 1.70   Lipid Panel    Component Value Date/Time   CHOL 114 10/04/2016 0757   TRIG 61 10/04/2016 0757   HDL 41 10/04/2016 0757   CHOLHDL 2.8 10/04/2016 0757   CHOLHDL 4 12/28/2015 0918   VLDL 25.6 12/28/2015 0918   LDLCALC 61 10/04/2016 0757     Other studies Reviewed: Additional studies/ records that were reviewed today with results demonstrating: .   ASSESSMENT AND PLAN:  1. MV repair: Continue SBE prophylaxis.  No evidence of congestive heart failure.  Mild dyspnea on exertion.  We will see if this improves with further exercise.  He tends to be more sedentary in the colder weather. 2. AVR: Murmur likely coming from his aortic valve.  No symptoms of significant aortic valve stenosis.  2017 echo reviewed. 3. Elevated LFTs in the past: Labs to be rechecked with his primary care doctor.  Continue atorvastatin for hyperlipidemia.  LDL was well controlled in May 2018.   Current medicines are reviewed at length with the patient today.  The patient concerns regarding his medicines were addressed.  The following changes have been made:  No change  Labs/ tests ordered today include:  No orders of the defined types were placed in this encounter.   Recommend 150 minutes/week of aerobic exercise Low fat, low carb, high fiber diet recommended  Disposition:   FU in 1 year   Signed, Lance Muss, MD  10/08/2017 8:39 AM    [facility] [address] [place], [place], [place]  [address] Phone: [phone]; Fax: [phone]

## 2017-10-08 ENCOUNTER — Encounter: Payer: Self-pay | Admitting: Interventional Cardiology

## 2017-10-08 ENCOUNTER — Ambulatory Visit: Payer: Managed Care, Other (non HMO) | Admitting: Interventional Cardiology

## 2017-10-08 VITALS — BP 122/82 | HR 58 | Ht 72.0 in | Wt 183.0 lb

## 2017-10-08 DIAGNOSIS — E785 Hyperlipidemia, unspecified: Secondary | ICD-10-CM | POA: Diagnosis not present

## 2017-10-08 DIAGNOSIS — R0609 Other forms of dyspnea: Secondary | ICD-10-CM

## 2017-10-08 DIAGNOSIS — R945 Abnormal results of liver function studies: Secondary | ICD-10-CM | POA: Diagnosis not present

## 2017-10-08 DIAGNOSIS — R0600 Dyspnea, unspecified: Secondary | ICD-10-CM

## 2017-10-08 DIAGNOSIS — I340 Nonrheumatic mitral (valve) insufficiency: Secondary | ICD-10-CM | POA: Diagnosis not present

## 2017-10-08 DIAGNOSIS — I351 Nonrheumatic aortic (valve) insufficiency: Secondary | ICD-10-CM | POA: Diagnosis not present

## 2017-10-08 DIAGNOSIS — Z953 Presence of xenogenic heart valve: Secondary | ICD-10-CM | POA: Diagnosis not present

## 2017-10-08 DIAGNOSIS — R7989 Other specified abnormal findings of blood chemistry: Secondary | ICD-10-CM

## 2017-10-08 NOTE — Patient Instructions (Signed)

## 2017-12-07 LAB — PSA: PSA: 1.34

## 2018-01-08 ENCOUNTER — Other Ambulatory Visit (INDEPENDENT_AMBULATORY_CARE_PROVIDER_SITE_OTHER): Payer: Managed Care, Other (non HMO)

## 2018-01-08 ENCOUNTER — Encounter: Payer: Self-pay | Admitting: Internal Medicine

## 2018-01-08 ENCOUNTER — Ambulatory Visit (INDEPENDENT_AMBULATORY_CARE_PROVIDER_SITE_OTHER): Payer: Managed Care, Other (non HMO) | Admitting: Internal Medicine

## 2018-01-08 VITALS — BP 112/80 | HR 67 | Temp 98.2°F | Ht 72.0 in | Wt 176.0 lb

## 2018-01-08 DIAGNOSIS — Z0000 Encounter for general adult medical examination without abnormal findings: Secondary | ICD-10-CM

## 2018-01-08 DIAGNOSIS — E785 Hyperlipidemia, unspecified: Secondary | ICD-10-CM

## 2018-01-08 LAB — COMPREHENSIVE METABOLIC PANEL
ALT: 53 U/L (ref 0–53)
AST: 26 U/L (ref 0–37)
Albumin: 4.4 g/dL (ref 3.5–5.2)
Alkaline Phosphatase: 69 U/L (ref 39–117)
BUN: 18 mg/dL (ref 6–23)
CO2: 27 mEq/L (ref 19–32)
Calcium: 9.5 mg/dL (ref 8.4–10.5)
Chloride: 105 mEq/L (ref 96–112)
Creatinine, Ser: 1.07 mg/dL (ref 0.40–1.50)
GFR: 75.53 mL/min (ref >60.00)
Glucose, Bld: 98 mg/dL (ref 70–99)
Potassium: 4 mEq/L (ref 3.5–5.1)
Sodium: 139 mEq/L (ref 135–145)
Total Bilirubin: 0.6 mg/dL (ref 0.2–1.2)
Total Protein: 6.8 g/dL (ref 6.0–8.3)

## 2018-01-08 LAB — LIPID PANEL
Cholesterol: 119 mg/dL (ref 0–200)
HDL: 39.9 mg/dL (ref >39.00)
LDL Cholesterol: 62 mg/dL (ref 0–99)
NonHDL: 79.4
Total CHOL/HDL Ratio: 3
Triglycerides: 88 mg/dL (ref 0.0–149.0)
VLDL: 17.6 mg/dL (ref 0.0–40.0)

## 2018-01-08 NOTE — Progress Notes (Signed)
Subjective:  Patient ID: Cody Barnes, maleLoni Barnes    DOB: 1960-10-03  Age: 57 y.o. MRN: [id number]  CC: Annual Exam and Hyperlipidemia   HPI Cody MuseStuart Insco presents for a CPX.  He feels well and offers no complaints.  He saw his urologist about a month ago and was pleased with his PSA result and he tells me that his genitourinary exam was normal.  Is tolerating the statin with no muscle or joint aches.  Outpatient Medications Prior to Visit  Medication Sig Dispense Refill  . atorvastatin (LIPITOR) 20 MG tablet TAKE 1 TABLET BY MOUTH EVERY DAY 90 tablet 1  . amoxicillin (AMOXIL) 500 MG capsule Take 500 mg by mouth as directed. Only used when going to the dentist     No facility-administered medications prior to visit.     ROS Review of Systems  Constitutional: Negative for diaphoresis and fatigue.  HENT: Negative.   Eyes: Negative for visual disturbance.  Respiratory: Negative.  Negative for cough, chest tightness, shortness of breath and wheezing.   Cardiovascular: Negative for chest pain, palpitations and leg swelling.  Gastrointestinal: Negative for abdominal pain, constipation, diarrhea, nausea and vomiting.  Endocrine: Negative.   Genitourinary: Negative.  Negative for difficulty urinating.  Musculoskeletal: Negative.  Negative for arthralgias, back pain, myalgias and neck pain.  Skin: Negative for color change.  Neurological: Negative.  Negative for dizziness, weakness and light-headedness.  Hematological: Negative for adenopathy. Does not bruise/bleed easily.  Psychiatric/Behavioral: Negative.     Objective:  BP 112/80 (BP Location: Left Arm, Patient Position: Sitting, Cuff Size: Normal)   Pulse 67   Temp 98.2 F (36.8 C) (Oral)   Ht 6' (1.829 m)   Wt 176 lb (79.8 kg)   SpO2 97%   BMI 23.87 kg/m   BP Readings from Last 3 Encounters:  01/08/18 112/80  10/08/17 122/82  01/03/17 134/84    Wt Readings from Last 3 Encounters:  01/08/18 176 lb (79.8 kg)  10/08/17  183 lb (83 kg)  01/03/17 176 lb 4 oz (79.9 kg)    Physical Exam  Constitutional: He is oriented to person, place, and time. No distress.  HENT:  Mouth/Throat: Oropharynx is clear and moist. No oropharyngeal exudate.  Eyes: Conjunctivae are normal. No scleral icterus.  Neck: Normal range of motion. Neck supple. No JVD present. No thyromegaly present.  Cardiovascular: Normal rate, regular rhythm, S1 normal and S2 normal. Exam reveals no gallop.  Murmur heard.  Systolic murmur is present with a grade of 2/6.  No diastolic murmur is present. Pulmonary/Chest: Effort normal and breath sounds normal. He has no wheezes. He has no rales.  Abdominal: Soft. Normal appearance and bowel sounds are normal. He exhibits no mass. There is no hepatosplenomegaly. There is no tenderness. No hernia.  Musculoskeletal: Normal range of motion. He exhibits no edema, tenderness or deformity.  Lymphadenopathy:    He has no cervical adenopathy.  Neurological: He is alert and oriented to person, place, and time.  Skin: Skin is warm and dry. No rash noted. He is not diaphoretic.  Vitals reviewed.   Lab Results  Component Value Date   WBC 5.9 01/03/2017   HGB 15.5 01/03/2017   HCT 46.3 01/03/2017   PLT 191.0 01/03/2017   GLUCOSE 98 01/08/2018   CHOL 119 01/08/2018   TRIG 88.0 01/08/2018   HDL 39.90 01/08/2018   LDLCALC 62 01/08/2018   ALT 53 01/08/2018   AST 26 01/08/2018   NA 139 01/08/2018   K 4.0 01/08/2018  CL 105 01/08/2018   CREATININE 1.07 01/08/2018   BUN 18 01/08/2018   CO2 27 01/08/2018   TSH 1.70 01/03/2017   PSA 1.34 12/07/2017   INR 1.0 12/10/2013   HGBA1C 5.3 12/28/2015    Ct Renal Stone Study  Result Date: 10/18/2014 CLINICAL DATA:  Left upper flank pain. EXAM: CT ABDOMEN AND PELVIS WITHOUT CONTRAST TECHNIQUE: Multidetector CT imaging of the abdomen and pelvis was performed following the standard protocol without IV contrast. COMPARISON:  None. FINDINGS: Lung bases are clear.  Postoperative changes in the heart which are incompletely evaluated on this examination. Negative for free air. Unenhanced CT was performed per clinician order. Lack of IV contrast limits sensitivity and specificity, especially for evaluation of abdominal/pelvic solid viscera. Normal appearance of the gallbladder, liver and spleen. Normal appearance of the pancreas and adrenal glands. No evidence for kidney stones or hydronephrosis. No acute abnormality involving the kidneys or urinary bladder. Possible low-density cyst in the left kidney upper pole. Normal appearance of the seminal vesicles and prostate. Moderate amount of stool in the colon. Normal appearance of the small bowel and appendix. There appears to be bilateral inguinal hernias containing fat. Difficult to exclude previous surgery in the inguinal areas, particularly on the left side. There is no significant free fluid or lymphadenopathy within the abdomen or pelvis. Internal fixation in the left femoral head and proximal left femur. No acute bone abnormalities. IMPRESSION: No acute abnormalities in the abdomen or pelvis. Specifically, no evidence for kidney stones or hydronephrosis. Electronically Signed   By: Richarda OverlieAdam  Henn M.D.   On: 10/18/2014 08:53    Assessment & Plan:   Lu DuffelStuart was seen today for annual exam and hyperlipidemia.  Diagnoses and all orders for this visit:  Hyperlipidemia with target LDL less than 130- He has achieved his LDL goal and is doing well on the statin. -     Comprehensive metabolic panel; Future  Routine general medical examination at a health care facility- Exam completed, labs reviewed, vaccines reviewed, screening for colon cancer and prostate cancer up-to-date, patient education material was given. -     Lipid panel; Future   I am having Cody MuseStuart Pawlicki maintain his amoxicillin and atorvastatin.  No orders of the defined types were placed in this encounter.    Follow-up: Return in about 1 year (around  01/09/2019).  Sanda Lingerhomas Natash Berman, MD

## 2018-01-08 NOTE — Patient Instructions (Signed)

## 2018-01-17 ENCOUNTER — Other Ambulatory Visit: Payer: Self-pay | Admitting: Internal Medicine

## 2018-01-17 DIAGNOSIS — E785 Hyperlipidemia, unspecified: Secondary | ICD-10-CM

## 2018-01-18 ENCOUNTER — Other Ambulatory Visit: Payer: Self-pay | Admitting: Internal Medicine

## 2018-01-18 DIAGNOSIS — E785 Hyperlipidemia, unspecified: Secondary | ICD-10-CM

## 2018-07-17 ENCOUNTER — Other Ambulatory Visit: Payer: Self-pay | Admitting: Internal Medicine

## 2018-07-17 DIAGNOSIS — E785 Hyperlipidemia, unspecified: Secondary | ICD-10-CM

## 2018-10-15 ENCOUNTER — Telehealth: Payer: Self-pay

## 2018-10-15 NOTE — Telephone Encounter (Signed)
Called pt to set up possible evisit. Left message asking pt to call the office.  

## 2018-10-16 NOTE — Telephone Encounter (Signed)
Virtual Visit Pre-Appointment Phone Call  "(Name), I am calling you today to discuss your upcoming appointment. We are currently trying to limit exposure to the virus that causes COVID-19 by seeing patients at home rather than in the office."  1. "What is the BEST phone number to call the day of the visit?" - include this in appointment notes  2. "Do you have or have access to (through a family member/friend) a smartphone with video capability that we can use for your visit?" a. If yes - list this number in appt notes as "cell" (if different from BEST phone #) and list the appointment type as a VIDEO visit in appointment notes b. If no - list the appointment type as a PHONE visit in appointment notes  3. Confirm consent - "In the setting of the current Covid19 crisis, you are scheduled for a (phone or video) visit with your provider on (date) at (time).  Just as we do with many in-office visits, in order for you to participate in this visit, we must obtain consent.  If you'd like, I can send this to your mychart (if signed up) or email for you to review.  Otherwise, I can obtain your verbal consent now.  All virtual visits are billed to your insurance company just like a normal visit would be.  By agreeing to a virtual visit, we'd like you to understand that the technology does not allow for your provider to perform an examination, and thus may limit your provider's ability to fully assess your condition. If your provider identifies any concerns that need to be evaluated in person, we will make arrangements to do so.  Finally, though the technology is pretty good, we cannot assure that it will always work on either your or our end, and in the setting of a video visit, we may have to convert it to a phone-only visit.  In either situation, we cannot ensure that we have a secure connection.  Are you willing to proceed?" STAFF: Did the patient verbally acknowledge consent to telehealth visit? Yes   4.  Advise patient to be prepared - "Two hours prior to your appointment, go ahead and check your blood pressure, pulse, oxygen saturation, and your weight (if you have the equipment to check those) and write them all down. When your visit starts, your provider will ask you for this information. If you have an Apple Watch or Kardia device, please plan to have heart rate information ready on the day of your appointment. Please have a pen and paper handy nearby the day of the visit as well."  5. Give patient instructions for MyChart download to smartphone OR Doximity/Doxy.me as below if video visit (depending on what platform provider is using)  6. Inform patient they will receive a phone call 15 minutes prior to their appointment time (may be from unknown caller ID) so they should be prepared to answer    TELEPHONE CALL NOTE  Cody Barnes has been deemed a candidate for a follow-up tele-health visit to limit community exposure during the Covid-19 pandemic. I spoke with the patient via phone to ensure availability of phone/video source, confirm preferred email & phone number, and discuss instructions and expectations.  I reminded Cody Barnes to be prepared with any vital sign and/or heart rhythm information that could potentially be obtained via home monitoring, at the time of his visit. I reminded Cody Barnes to expect a phone call prior to his visit.  Clide Dalesanielle M  [name], CMA 10/16/2018 10:06 AM   INSTRUCTIONS FOR DOWNLOADING THE MYCHART APP TO SMARTPHONE  - The patient must first make sure to have activated MyChart and know their login information - If Apple, go to [employer] and type in MyChart in the search bar and download the app. If Android, ask patient to go to [employer] and type in [place] in the search bar and download the app. The app is free but as with any other app downloads, their phone may require them to verify saved payment information or Apple/Android password.  - The  patient will need to then log into the app with their MyChart username and password, and select [facility] as their healthcare provider to link the account. When it is time for your visit, go to the MyChart app, find appointments, and click Begin Video Visit. Be sure to Select Allow for your device to access the Microphone and Camera for your visit. You will then be connected, and your provider will be with you shortly.  **If they have any issues connecting, or need assistance please contact MyChart service desk [phone])**  **If using a computer, in order to ensure the best quality for their visit they will need to use either of the following Internet Browsers: [employer] or Google Chrome**  IF USING DOXIMITY or DOXY.ME - The patient will receive a link just prior to their visit by text.     FULL LENGTH CONSENT FOR TELE-HEALTH VISIT   I hereby voluntarily request, consent and authorize [facility] and its employed or contracted physicians, physician assistants, nurse practitioners or other licensed health care professionals (the Practitioner), to provide me with telemedicine health care services (the "Services") as deemed necessary by the treating Practitioner. I acknowledge and consent to receive the Services by the Practitioner via telemedicine. I understand that the telemedicine visit will involve communicating with the Practitioner through live audiovisual communication technology and the disclosure of certain medical information by electronic transmission. I acknowledge that I have been given the opportunity to request an in-person assessment or other available alternative prior to the telemedicine visit and am voluntarily participating in the telemedicine visit.  I understand that I have the right to withhold or withdraw my consent to the use of telemedicine in the course of my care at any time, without affecting my right to future care or treatment, and that the  Practitioner or I may terminate the telemedicine visit at any time. I understand that I have the right to inspect all information obtained and/or recorded in the course of the telemedicine visit and may receive copies of available information for a reasonable fee.  I understand that some of the potential risks of receiving the Services via telemedicine include:  Marland Kitchen Delay or interruption in medical evaluation due to technological equipment failure or disruption; . Information transmitted may not be sufficient (e.g. poor resolution of images) to allow for appropriate medical decision making by the Practitioner; and/or  . In rare instances, security protocols could fail, causing a breach of personal health information.  Furthermore, I acknowledge that it is my responsibility to provide information about my medical history, conditions and care that is complete and accurate to the best of my ability. I acknowledge that Practitioner's advice, recommendations, and/or decision may be based on factors not within their control, such as incomplete or inaccurate data provided by me or distortions of diagnostic images or specimens that may result from electronic transmissions. I understand that the practice  of medicine is not an Chief Strategy Officer and that Practitioner makes no warranties or guarantees regarding treatment outcomes. I acknowledge that I will receive a copy of this consent concurrently upon execution via email to the email address I last provided but may also request a printed copy by calling the office of [facility] [place].    I understand that my insurance will be billed for this visit.   I have read or had this consent read to me. . I understand the contents of this consent, which adequately explains the benefits and risks of the Services being provided via telemedicine.  . I have been provided ample opportunity to ask questions regarding this consent and the Services and have had my questions answered to my  satisfaction. . I give my informed consent for the services to be provided through the use of telemedicine in my medical care  By participating in this telemedicine visit I agree to the above.

## 2018-10-16 NOTE — Progress Notes (Signed)
Virtual Visit via Video Note   This visit type was conducted due to national recommendations for restrictions regarding the COVID-19 Pandemic (e.g. social distancing) in an effort to limit this patient's exposure and mitigate transmission in our community.  Due to his co-morbid illnesses, this patient is at least at moderate risk for complications without adequate follow up.  This format is felt to be most appropriate for this patient at this time.  All issues noted in this document were discussed and addressed.  A limited physical exam was performed with this format.  Please refer to the patient's chart for his consent to telehealth for [facility].  Attempted video visit , but patient unable to access camera.  Date:  10/17/2018   ID:  Cody Barnes, DOB 1960/07/30, MRN [id number]  Patient Location: Home Provider Location: Home  PCP:  Etta Grandchild, MD  Cardiologist:  No primary care provider on file. [place] Electrophysiologist:  None   Evaluation Performed:  Follow-Up Visit  Chief Complaint:  Valve disease  History of Present Illness:    Cody Barnes is a 58 y.o. male who has had mitral valve prolapse for many years. He was followed with serial echoes. He was noted to have increased LV size at the time of his last echocardiogram in 2015 with some symptoms of dyspnea on exertion despite preserved LVEF. He was referred for surgery. He chose to go to [facility] clinic for his operation. He underwent mitral valve repair and aortic valve replacement due to aortic insufficiency as well. He chose to haveabioprostheticaorticvalve. Cardiac cath prior to the valve surgery was negative for significant coronary artery disease.  He was started on atorvastatin for elevated cholesterol. Of note, he had a mildly increased ALT before starting the medicine.  He has been doing well.    He recently got a new puppy and walks the dog frequently.  He does  A lot of hiking as well.    When  he walks up stairs, he has some DOE.  Walking up hills are ok.    Denies : Chest pain. Dizziness. Leg edema. Nitroglycerin use. Orthopnea. Palpitations. Paroxysmal nocturnal dyspnea. Shortness of breath. Syncope.   The patient does not have symptoms concerning for COVID-19 infection (fever, chills, cough, or new shortness of breath).    Past Medical History:  Diagnosis Date  . Aortic insufficiency   . Elevated PSA 10/12/2014  . 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 Heart murmur   . Mitral regurgitation   . Mitral valve prolapse   . 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 HIP FRACTURE SURGERY     left  . INGUINAL HERNIA REPAIR     as infant  . LEFT AND RIGHT HEART CATHETERIZATION WITH CORONARY ANGIOGRAM N/A 12/12/2013   Procedure: LEFT AND RIGHT HEART CATHETERIZATION WITH CORONARY ANGIOGRAM;  Surgeon: Corky Crafts, MD;  Location: [facility] CATH LAB;  Service: Cardiovascular;  Laterality: N/A;  . TEE WITHOUT CARDIOVERSION N/A 11/25/2013   Procedure: TRANSESOPHAGEAL ECHOCARDIOGRAM (TEE);  Surgeon: Quintella Reichert, MD;  Location: [facility] ENDOSCOPY;  Service: Cardiovascular;  Laterality: N/A;     Current Meds  Medication Sig  . amoxicillin (AMOXIL) 500 MG capsule Take 500 mg by mouth as directed. Only used when going to the dentist  . atorvastatin (LIPITOR) 20 MG tablet TAKE 1 TABLET BY MOUTH EVERY DAY     Allergies:   Patient has no known allergies.  Social History   Tobacco Use  . Smoking status: Never Smoker  . Smokeless tobacco: Never Used  Substance Use Topics  . Alcohol use: Yes    Alcohol/week: 5.0 standard drinks    Types: 5 Cans of beer per week  . Drug use: No     Family Hx: The patient's family history includes Cancer in his mother. There is no history of Drug abuse, Early death, Heart disease, Hyperlipidemia, Hypertension, Kidney disease, Stroke, or Arthritis.  ROS:   Please see the  history of present illness.    DOE All other systems reviewed and are negative.   Prior CV studies:   The following studies were reviewed today:  2017 echo: - Left ventricle: The cavity size was mildly dilated. Wall   thickness was normal. Systolic function was normal. The estimated   ejection fraction was in the range of 50% to 55%. Wall motion was   normal; there were no regional wall motion abnormalities. Doppler   parameters are consistent with abnormal left ventricular   relaxation (grade 1 diastolic dysfunction). Doppler parameters   are consistent with high ventricular filling pressure. - Aortic valve: A bioprosthesis was present. - Mitral valve: Prior procedures included surgical repair. The   findings are consistent with mild stenosis. - Left atrium: The atrium was mildly dilated.  Labs/Other Tests and Data Reviewed:    EKG:  An ECG dated 5/19 was personally reviewed today and demonstrated:  NSR  Recent Labs: 01/08/2018: ALT 53; BUN 18; Creatinine, Ser 1.07; Potassium 4.0; Sodium 139   Recent Lipid Panel Lab Results  Component Value Date/Time   CHOL 119 01/08/2018 08:31 AM   CHOL 114 10/04/2016 07:57 AM   TRIG 88.0 01/08/2018 08:31 AM   HDL 39.90 01/08/2018 08:31 AM   HDL 41 10/04/2016 07:57 AM   CHOLHDL 3 01/08/2018 08:31 AM   LDLCALC 62 01/08/2018 08:31 AM   LDLCALC 61 10/04/2016 07:57 AM    Wt Readings from Last 3 Encounters:  10/17/18 168 lb 8 oz (76.4 kg)  01/08/18 176 lb (79.8 kg)  10/08/17 183 lb (83 kg)     Objective:    Vital Signs:  Ht 6' (1.829 m)   Wt 168 lb 8 oz (76.4 kg)   BMI 22.85 kg/m    VITAL SIGNS:  reviewed GEN:  no acute distress RESPIRATORY:  no shortness of breath PSYCH:  normal affect exam limited by phone  ASSESSMENT & PLAN:    1. MV repair: Mild DOE.  Recheck echo.  No other signs of CHF.  Reassuring that he feels well while hiking.  Overall seems to be doing well. 2. AVR: Check echo.  Needs SBE prophylaxis for dental  procedures.  3. Elevated LFTs: Recheck labs.  He has been on a statin for more than 6 months.  Will have lipids checked in August with Dr. Yetta BarreJones.  4. Hyperlipidemia: COntrolled on atorvastatin.  COVID-19 Education: The signs and symptoms of COVID-19 were discussed with the patient and how to seek care for testing (follow up with PCP or arrange E-visit).  The importance of social distancing was discussed today.  Time:   Today, I have spent 25 minutes with the patient with telehealth technology discussing the above problems.     Medication Adjustments/Labs and Tests Ordered: Current medicines are reviewed at length with the patient today.  Concerns regarding medicines are outlined above.   Tests Ordered: No orders of the defined types were placed in this encounter.   Medication Changes:  No orders of the defined types were placed in this encounter.   Disposition:  Follow up in 1 year(s)  Signed, Lance Muss, MD  10/17/2018 8:31 AM    [facility]

## 2018-10-17 ENCOUNTER — Telehealth: Payer: Self-pay

## 2018-10-17 ENCOUNTER — Encounter: Payer: Self-pay | Admitting: Interventional Cardiology

## 2018-10-17 ENCOUNTER — Other Ambulatory Visit: Payer: Self-pay

## 2018-10-17 ENCOUNTER — Telehealth (INDEPENDENT_AMBULATORY_CARE_PROVIDER_SITE_OTHER): Payer: 59 | Admitting: Interventional Cardiology

## 2018-10-17 VITALS — Ht 72.0 in | Wt 168.5 lb

## 2018-10-17 DIAGNOSIS — R7989 Other specified abnormal findings of blood chemistry: Secondary | ICD-10-CM

## 2018-10-17 DIAGNOSIS — I351 Nonrheumatic aortic (valve) insufficiency: Secondary | ICD-10-CM

## 2018-10-17 DIAGNOSIS — E785 Hyperlipidemia, unspecified: Secondary | ICD-10-CM

## 2018-10-17 DIAGNOSIS — Z953 Presence of xenogenic heart valve: Secondary | ICD-10-CM

## 2018-10-17 DIAGNOSIS — I340 Nonrheumatic mitral (valve) insufficiency: Secondary | ICD-10-CM | POA: Diagnosis not present

## 2018-10-17 DIAGNOSIS — R0609 Other forms of dyspnea: Secondary | ICD-10-CM | POA: Diagnosis not present

## 2018-10-17 DIAGNOSIS — R0600 Dyspnea, unspecified: Secondary | ICD-10-CM

## 2018-10-17 MED ORDER — ATORVASTATIN CALCIUM 20 MG PO TABS: 20.0000 mg | ORAL_TABLET | Freq: Every day | ORAL | 3 refills | Status: DC

## 2018-10-17 NOTE — Patient Instructions (Signed)
Medication Instructions:  Your physician recommends that you continue on your current medications as directed. Please refer to the Current Medication list given to you today.  If you need a refill on your cardiac medications before your next appointment, please call your pharmacy.   Lab work: Your physician recommends that you return for lab work on 10/24/18 for CBC and BMET   If you have labs (blood work) drawn today and your tests are completely normal, you will receive your results only by: Marland Kitchen MyChart Message (if you have MyChart) OR . A paper copy in the mail If you have any lab test that is abnormal or we need to change your treatment, we will call you to review the results.  Testing/Procedures: Your physician has requested that you have an echocardiogram on 10/24/18 at 7:30 AM. Echocardiography is a painless test that uses sound waves to create images of your heart. It provides your doctor with information about the size and shape of your heart and how well your heart's chambers and valves are working. This procedure takes approximately one hour. There are no restrictions for this procedure.  Follow-Up: At [facility], you and your health needs are our priority.  As part of our continuing mission to provide you with exceptional heart care, we have created designated Provider Care Teams.  These Care Teams include your primary Cardiologist (physician) and Advanced Practice Providers (APPs -  Physician Assistants and Nurse Practitioners) who all work together to provide you with the care you need, when you need it. . You will need a follow up appointment in 1 year.  Please call our office 2 months in advance to schedule this appointment.  You may see Everette Rank, MD or one of the following Advanced Practice Providers on your designated Care Team:   . Robbie Lis, PA-C . Dayna Dunn, PA-C . Jacolyn Reedy, PA-C  Any Other Special Instructions Will Be Listed Below (If  Applicable).

## 2018-10-17 NOTE — Telephone Encounter (Signed)
Patient scheduled for echo and labs on 10/24/18      COVID-19 Pre-Screening Questions:  . In the past 7 to 10 days have you had a cough,  shortness of breath, headache, congestion, fever (100 or greater) body aches, chills, sore throat, or sudden loss of taste or sense of smell? NO, other than DOE . Have you been around anyone with known Covid 19? NO . Have you been around anyone who is awaiting Covid 19 test results in the past 7 to 10 days? NO . Have you been around anyone who has been exposed to Covid 19, or has mentioned symptoms of Covid 19 within the past 7 to 10 days? NO

## 2018-10-23 ENCOUNTER — Telehealth (HOSPITAL_COMMUNITY): Payer: Self-pay | Admitting: Cardiology

## 2018-10-23 NOTE — Telephone Encounter (Signed)

## 2018-10-24 ENCOUNTER — Other Ambulatory Visit: Payer: 59 | Admitting: *Deleted

## 2018-10-24 ENCOUNTER — Other Ambulatory Visit: Payer: Self-pay

## 2018-10-24 ENCOUNTER — Ambulatory Visit (HOSPITAL_COMMUNITY): Payer: 59 | Attending: Cardiovascular Disease

## 2018-10-24 DIAGNOSIS — I340 Nonrheumatic mitral (valve) insufficiency: Secondary | ICD-10-CM

## 2018-10-24 DIAGNOSIS — Z952 Presence of prosthetic heart valve: Secondary | ICD-10-CM | POA: Diagnosis not present

## 2018-10-24 DIAGNOSIS — I358 Other nonrheumatic aortic valve disorders: Secondary | ICD-10-CM | POA: Insufficient documentation

## 2018-10-24 DIAGNOSIS — R0609 Other forms of dyspnea: Secondary | ICD-10-CM

## 2018-10-24 DIAGNOSIS — Z953 Presence of xenogenic heart valve: Secondary | ICD-10-CM

## 2018-10-24 DIAGNOSIS — R0600 Dyspnea, unspecified: Secondary | ICD-10-CM

## 2018-10-24 DIAGNOSIS — I351 Nonrheumatic aortic (valve) insufficiency: Secondary | ICD-10-CM

## 2018-10-24 DIAGNOSIS — R7989 Other specified abnormal findings of blood chemistry: Secondary | ICD-10-CM

## 2018-10-24 DIAGNOSIS — E785 Hyperlipidemia, unspecified: Secondary | ICD-10-CM

## 2018-10-24 LAB — BASIC METABOLIC PANEL
BUN/Creatinine Ratio: 19 (ref 9–20)
BUN: 20 mg/dL (ref 6–24)
CO2: 23 mmol/L (ref 20–29)
Calcium: 9.5 mg/dL (ref 8.7–10.2)
Chloride: 101 mmol/L (ref 96–106)
Creatinine, Ser: 1.03 mg/dL (ref 0.76–1.27)
GFR calc Af Amer: 92 mL/min/{1.73_m2} (ref >59)
GFR calc non Af Amer: 80 mL/min/{1.73_m2} (ref >59)
Glucose: 100 mg/dL — ABNORMAL HIGH (ref 65–99)
Potassium: 4.6 mmol/L (ref 3.5–5.2)
Sodium: 139 mmol/L (ref 134–144)

## 2018-10-24 LAB — CBC
Hematocrit: 42.9 % (ref 37.5–51.0)
Hemoglobin: 14.7 g/dL (ref 13.0–17.7)
MCH: 30.7 pg (ref 26.6–33.0)
MCHC: 34.3 g/dL (ref 31.5–35.7)
MCV: 90 fL (ref 79–97)
Platelets: 214 10*3/uL (ref 150–450)
RBC: 4.79 x10E6/uL (ref 4.14–5.80)
RDW: 12.4 % (ref 11.6–15.4)
WBC: 6.6 10*3/uL (ref 3.4–10.8)

## 2018-12-19 LAB — PSA: PSA: 1.13

## 2019-01-09 ENCOUNTER — Other Ambulatory Visit: Payer: Self-pay

## 2019-01-09 DIAGNOSIS — Z20822 Contact with and (suspected) exposure to covid-19: Secondary | ICD-10-CM

## 2019-01-10 LAB — NOVEL CORONAVIRUS, NAA: SARS-CoV-2, NAA: NOT DETECTED

## 2019-02-27 ENCOUNTER — Other Ambulatory Visit: Payer: Self-pay

## 2019-02-27 ENCOUNTER — Ambulatory Visit (INDEPENDENT_AMBULATORY_CARE_PROVIDER_SITE_OTHER): Payer: 59 | Admitting: Internal Medicine

## 2019-02-27 ENCOUNTER — Encounter: Payer: Self-pay | Admitting: Internal Medicine

## 2019-02-27 ENCOUNTER — Other Ambulatory Visit (INDEPENDENT_AMBULATORY_CARE_PROVIDER_SITE_OTHER): Payer: 59

## 2019-02-27 VITALS — BP 142/92 | HR 58 | Temp 97.8°F | Resp 16 | Ht 72.0 in | Wt 174.5 lb

## 2019-02-27 DIAGNOSIS — Z0000 Encounter for general adult medical examination without abnormal findings: Secondary | ICD-10-CM | POA: Diagnosis not present

## 2019-02-27 DIAGNOSIS — I2510 Atherosclerotic heart disease of native coronary artery without angina pectoris: Secondary | ICD-10-CM

## 2019-02-27 DIAGNOSIS — E785 Hyperlipidemia, unspecified: Secondary | ICD-10-CM | POA: Diagnosis not present

## 2019-02-27 DIAGNOSIS — I10 Essential (primary) hypertension: Secondary | ICD-10-CM

## 2019-02-27 LAB — URINALYSIS, ROUTINE W REFLEX MICROSCOPIC
Bilirubin Urine: NEGATIVE
Hgb urine dipstick: NEGATIVE
Ketones, ur: NEGATIVE
Leukocytes,Ua: NEGATIVE
Nitrite: NEGATIVE
RBC / HPF: NONE SEEN (ref 0–?)
Specific Gravity, Urine: 1.025 (ref 1.000–1.030)
Total Protein, Urine: NEGATIVE
Urine Glucose: NEGATIVE
Urobilinogen, UA: 0.2 (ref 0.0–1.0)
pH: 5.5 (ref 5.0–8.0)

## 2019-02-27 LAB — CBC WITH DIFFERENTIAL/PLATELET
Basophils Absolute: 0.1 10*3/uL (ref 0.0–0.1)
Basophils Relative: 0.8 % (ref 0.0–3.0)
Eosinophils Absolute: 0.3 10*3/uL (ref 0.0–0.7)
Eosinophils Relative: 4.2 % (ref 0.0–5.0)
HCT: 45.4 % (ref 39.0–52.0)
Hemoglobin: 15.2 g/dL (ref 13.0–17.0)
Lymphocytes Relative: 21.5 % (ref 12.0–46.0)
Lymphs Abs: 1.4 10*3/uL (ref 0.7–4.0)
MCHC: 33.4 g/dL (ref 30.0–36.0)
MCV: 92.2 fl (ref 78.0–100.0)
Monocytes Absolute: 0.7 10*3/uL (ref 0.1–1.0)
Monocytes Relative: 10.9 % (ref 3.0–12.0)
Neutro Abs: 4.2 10*3/uL (ref 1.4–7.7)
Neutrophils Relative %: 62.6 % (ref 43.0–77.0)
Platelets: 184 10*3/uL (ref 150.0–400.0)
RBC: 4.93 Mil/uL (ref 4.22–5.81)
RDW: 13 % (ref 11.5–15.5)
WBC: 6.7 10*3/uL (ref 4.0–10.5)

## 2019-02-27 LAB — BASIC METABOLIC PANEL
BUN: 24 mg/dL — ABNORMAL HIGH (ref 6–23)
CO2: 27 mEq/L (ref 19–32)
Calcium: 9.2 mg/dL (ref 8.4–10.5)
Chloride: 106 mEq/L (ref 96–112)
Creatinine, Ser: 1.01 mg/dL (ref 0.40–1.50)
GFR: 75.66 mL/min (ref >60.00)
Glucose, Bld: 100 mg/dL — ABNORMAL HIGH (ref 70–99)
Potassium: 4.8 mEq/L (ref 3.5–5.1)
Sodium: 140 mEq/L (ref 135–145)

## 2019-02-27 LAB — HEPATIC FUNCTION PANEL
ALT: 27 U/L (ref 0–53)
AST: 18 U/L (ref 0–37)
Albumin: 4.4 g/dL (ref 3.5–5.2)
Alkaline Phosphatase: 64 U/L (ref 39–117)
Bilirubin, Direct: 0.1 mg/dL (ref 0.0–0.3)
Total Bilirubin: 0.6 mg/dL (ref 0.2–1.2)
Total Protein: 6.8 g/dL (ref 6.0–8.3)

## 2019-02-27 LAB — LIPID PANEL
Cholesterol: 149 mg/dL (ref 0–200)
HDL: 49.9 mg/dL (ref >39.00)
LDL Cholesterol: 86 mg/dL (ref 0–99)
NonHDL: 99.3
Total CHOL/HDL Ratio: 3
Triglycerides: 66 mg/dL (ref 0.0–149.0)
VLDL: 13.2 mg/dL (ref 0.0–40.0)

## 2019-02-27 LAB — VITAMIN D 25 HYDROXY (VIT D DEFICIENCY, FRACTURES): VITD: 37.34 ng/mL (ref 30.00–100.00)

## 2019-02-27 LAB — TSH: TSH: 1.54 u[IU]/mL (ref 0.35–4.50)

## 2019-02-27 MED ORDER — EDARBI 40 MG PO TABS: 1.0000 | ORAL_TABLET | Freq: Every day | ORAL | 1 refills | Status: DC

## 2019-02-27 NOTE — Patient Instructions (Signed)

## 2019-02-27 NOTE — Progress Notes (Signed)
Subjective:  Patient ID: Cody Barnes, male    DOB: Sep 30, 1960  Age: 58 y.o. MRN: [id number]  CC: Hypertension, Hyperlipidemia, and Annual Exam   HPI Cody Barnes presents for a CPX.  He has been active recently and denies any recent episodes of CP, DOE, palpitations, edema, or fatigue.  He saw his urologist about 2 or 3 months ago.  Outpatient Medications Prior to Visit  Medication Sig Dispense Refill  . atorvastatin (LIPITOR) 20 MG tablet Take 1 tablet (20 mg total) by mouth daily. 90 tablet 3  . amoxicillin (AMOXIL) 500 MG capsule Take 500 mg by mouth as directed. Only used when going to the dentist     No facility-administered medications prior to visit.     ROS Review of Systems  Constitutional: Negative for chills, diaphoresis, fatigue and fever.  HENT: Negative.   Eyes: Negative for visual disturbance.  Respiratory: Negative for cough, chest tightness, shortness of breath and wheezing.   Cardiovascular: Negative for chest pain, palpitations and leg swelling.  Gastrointestinal: Negative for abdominal pain, constipation, diarrhea, nausea and vomiting.  Endocrine: Negative.   Genitourinary: Negative.  Negative for difficulty urinating, dysuria and hematuria.  Musculoskeletal: Negative for arthralgias, joint swelling and myalgias.  Skin: Negative.  Negative for color change and pallor.  Neurological: Negative.  Negative for dizziness, weakness, light-headedness and headaches.  Hematological: Negative for adenopathy. Does not bruise/bleed easily.  Psychiatric/Behavioral: Negative.     Objective:  BP (!) 142/92 (BP Location: Left Arm, Patient Position: Sitting, Cuff Size: Normal)   Pulse (!) 58   Temp 97.8 F (36.6 C) (Oral)   Resp 16   Ht 6' (1.829 m)   Wt 174 lb 8 oz (79.2 kg)   SpO2 97%   BMI 23.67 kg/m   BP Readings from Last 3 Encounters:  02/27/19 (!) 142/92  01/08/18 112/80  10/08/17 122/82    Wt Readings from Last 3 Encounters:  02/27/19 174 lb 8  oz (79.2 kg)  10/17/18 168 lb 8 oz (76.4 kg)  01/08/18 176 lb (79.8 kg)    Physical Exam Vitals signs reviewed.  Constitutional:      Appearance: Normal appearance.  HENT:     Nose: Nose normal. No congestion.     Mouth/Throat:     Mouth: Mucous membranes are moist.  Eyes:     General: No scleral icterus.    Conjunctiva/sclera: Conjunctivae normal.  Neck:     Musculoskeletal: Normal range of motion and neck supple.  Cardiovascular:     Rate and Rhythm: Normal rate and regular rhythm.     Pulses: Normal pulses.     Heart sounds: Murmur present. Systolic murmur present with a grade of 2/6. No diastolic murmur.     Comments: EKG ----  Sinus  Bradycardia  -Old anteroseptal infarct.   ABNORMAL - no change from the prior EKG Pulmonary:     Effort: Pulmonary effort is normal.     Breath sounds: No stridor. No wheezing, rhonchi or rales.  Abdominal:     General: Abdomen is flat. Bowel sounds are normal. There is no distension.     Palpations: Abdomen is soft. There is no hepatomegaly or splenomegaly.     Tenderness: There is no abdominal tenderness.  Genitourinary:    Comments: GU and rectal exams were deferred as he tells me his urologist just did this 2 months ago. Musculoskeletal:     Right lower leg: No edema.     Left lower leg: No edema.  Skin:    General: Skin is warm and dry.     Coloration: Skin is not pale.  Neurological:     General: No focal deficit present.     Mental Status: He is alert and oriented to person, place, and time. Mental status is at baseline.  Psychiatric:        Mood and Affect: Mood normal.        Behavior: Behavior normal.     Lab Results  Component Value Date   WBC 6.7 02/27/2019   HGB 15.2 02/27/2019   HCT 45.4 02/27/2019   PLT 184.0 02/27/2019   GLUCOSE 100 (H) 02/27/2019   CHOL 149 02/27/2019   TRIG 66.0 02/27/2019   HDL 49.90 02/27/2019   LDLCALC 86 02/27/2019   ALT 27 02/27/2019   AST 18 02/27/2019   NA 140 02/27/2019   K  4.8 02/27/2019   CL 106 02/27/2019   CREATININE 1.01 02/27/2019   BUN 24 (H) 02/27/2019   CO2 27 02/27/2019   TSH 1.54 02/27/2019   PSA 1.13 12/19/2018   INR 1.0 12/10/2013   HGBA1C 5.3 12/28/2015    Ct Renal Stone Study  Result Date: 10/18/2014 CLINICAL DATA:  Left upper flank pain. EXAM: CT ABDOMEN AND PELVIS WITHOUT CONTRAST TECHNIQUE: Multidetector CT imaging of the abdomen and pelvis was performed following the standard protocol without IV contrast. COMPARISON:  None. FINDINGS: Lung bases are clear. Postoperative changes in the heart which are incompletely evaluated on this examination. Negative for free air. Unenhanced CT was performed per clinician order. Lack of IV contrast limits sensitivity and specificity, especially for evaluation of abdominal/pelvic solid viscera. Normal appearance of the gallbladder, liver and spleen. Normal appearance of the pancreas and adrenal glands. No evidence for kidney stones or hydronephrosis. No acute abnormality involving the kidneys or urinary bladder. Possible low-density cyst in the left kidney upper pole. Normal appearance of the seminal vesicles and prostate. Moderate amount of stool in the colon. Normal appearance of the small bowel and appendix. There appears to be bilateral inguinal hernias containing fat. Difficult to exclude previous surgery in the inguinal areas, particularly on the left side. There is no significant free fluid or lymphadenopathy within the abdomen or pelvis. Internal fixation in the left femoral head and proximal left femur. No acute bone abnormalities. IMPRESSION: No acute abnormalities in the abdomen or pelvis. Specifically, no evidence for kidney stones or hydronephrosis. Electronically Signed   By: Markus Daft M.D.   On: 10/18/2014 08:53    Assessment & Plan:   Cody Barnes was seen today for hypertension, hyperlipidemia and annual exam.  Diagnoses and all orders for this visit:  Routine general medical examination at a health  care facility- Exam completed, labs reviewed, he refused a flu vaccine, colon cancer screening is up-to-date, patient education was given. -     Lipid panel; Future  Hyperlipidemia with target LDL less than 130- He has achieved his LDL goal and is doing well on the statin. -     TSH; Future -     Hepatic function panel; Future  Coronary artery disease involving native coronary artery of native heart without angina pectoris- He has an ongoing diagnosis of CAD but I do not see any evidence of documented CAD and he tells me he does not think he has CAD.  He will continue to follow-up with cardiology. -     Azilsartan Medoxomil (EDARBI) 40 MG TABS; Take 1 tablet by mouth daily.  Essential hypertension - He  has developed stage I hypertension.  His EKG is negative for LVH.  His labs are negative for secondary causes or endorgan damage.  I have asked him to start taking an ARB. -     EKG 12-Lead -     CBC with Differential/Platelet; Future -     Basic metabolic panel; Future -     TSH; Future -     Urinalysis, Routine w reflex microscopic; Future -     VITAMIN D 25 Hydroxy (Vit-D Deficiency, Fractures); Future -     Azilsartan Medoxomil (EDARBI) 40 MG TABS; Take 1 tablet by mouth daily.   I have discontinued Cody Barnes's amoxicillin. I am also having him start on Edarbi. Additionally, I am having him maintain his atorvastatin.  Meds ordered this encounter  Medications  . Azilsartan Medoxomil (EDARBI) 40 MG TABS    Sig: Take 1 tablet by mouth daily.    Dispense:  90 tablet    Refill:  1     Follow-up: Return in about 4 months (around 06/30/2019).  Sanda Lingerhomas Moxon Messler, MD

## 2019-03-03 ENCOUNTER — Other Ambulatory Visit: Payer: Self-pay | Admitting: Internal Medicine

## 2019-03-03 ENCOUNTER — Telehealth: Payer: Self-pay | Admitting: Internal Medicine

## 2019-03-03 DIAGNOSIS — I10 Essential (primary) hypertension: Secondary | ICD-10-CM

## 2019-03-03 MED ORDER — IRBESARTAN 150 MG PO TABS: 150.0000 mg | ORAL_TABLET | Freq: Every day | ORAL | 1 refills | Status: DC

## 2019-03-03 NOTE — Telephone Encounter (Signed)
Copied from [place]. Topic: Quick Communication - Rx Refill/Question >> Mar 03, 2019  9:55 AM Rainey Pines A wrote: Medication: Patient would like an alternative to Azilsartan Medoxomil (EDARBI) 40 MG TABS due to insurance partially covering medication . Patient would like a callback once new medication has been sent in   Has the patient contacted their pharmacy? {Yes (Agent: If no, request that the patient contact the pharmacy for the refill.) (Agent: If yes, when and what did the pharmacy advise?)Contact Pcp  Preferred Pharmacy (with phone number or street name):[facility], [place] [phone] (Phone) [phone] (Fax)    Agent: Please be advised that RX refills may take up to 3 business days. We ask that you follow-up with your pharmacy.

## 2019-03-03 NOTE — Telephone Encounter (Signed)
Called pt and lvm informing Rx for irbesartan was sent in.

## 2019-03-03 NOTE — Telephone Encounter (Signed)
Medication: Patient would like an alternative to Azilsartan Medoxomil (EDARBI) 40 MG TABS due to insurance partially covering medication . Patient would like a callback once new medication has been sent in

## 2019-03-03 NOTE — Telephone Encounter (Signed)
New RX sent  TJ 

## 2019-03-03 NOTE — Telephone Encounter (Signed)
Requesting alternative forEdarbi due to cost.

## 2019-03-24 ENCOUNTER — Other Ambulatory Visit: Payer: Self-pay

## 2019-03-24 DIAGNOSIS — Z20822 Contact with and (suspected) exposure to covid-19: Secondary | ICD-10-CM

## 2019-03-26 ENCOUNTER — Other Ambulatory Visit: Payer: Self-pay

## 2019-03-26 DIAGNOSIS — Z20822 Contact with and (suspected) exposure to covid-19: Secondary | ICD-10-CM

## 2019-03-26 LAB — NOVEL CORONAVIRUS, NAA

## 2019-03-27 LAB — NOVEL CORONAVIRUS, NAA: SARS-CoV-2, NAA: NOT DETECTED

## 2019-04-30 ENCOUNTER — Telehealth: Payer: Self-pay

## 2019-04-30 MED ORDER — ZOSTER VAC RECOMB ADJUVANTED 50 MCG/0.5ML IM SUSR: 0.5000 mL | Freq: Once | INTRAMUSCULAR | 1 refills | Status: AC

## 2019-04-30 NOTE — Telephone Encounter (Signed)
Copied from [place] [phone]. Topic: General - Other >> Apr 29, 2019  1:50 PM Celene Kras wrote: Reason for CRM: Pts wife called and is requesting to know if pt can receive second shingles shot or if he would have to receive both all over again. Please advise. >> Apr 30, 2019 11:10 AM Peace, Tammy L wrote: Patient states he would like shingrix script sent to pharmacy b/c it is easier for him to get this done there. >> Apr 30, 2019 10:11 AM Cairrikier Dian Queen, CMA wrote: lvm for pt to call back.   May tell pt: If he has not had the "new" shingles vaccine, it is recommended. If he has had the "new" shingles vaccine - when did he have them (2) done.

## 2019-07-01 ENCOUNTER — Encounter: Payer: Self-pay | Admitting: Internal Medicine

## 2019-07-01 ENCOUNTER — Other Ambulatory Visit: Payer: Self-pay

## 2019-07-01 ENCOUNTER — Ambulatory Visit (INDEPENDENT_AMBULATORY_CARE_PROVIDER_SITE_OTHER): Payer: 59 | Admitting: Internal Medicine

## 2019-07-01 VITALS — BP 126/78 | HR 60 | Temp 98.0°F | Resp 16 | Ht 72.0 in | Wt 175.0 lb

## 2019-07-01 DIAGNOSIS — I10 Essential (primary) hypertension: Secondary | ICD-10-CM | POA: Diagnosis not present

## 2019-07-01 LAB — BASIC METABOLIC PANEL
BUN: 24 mg/dL — ABNORMAL HIGH (ref 6–23)
CO2: 25 mEq/L (ref 19–32)
Calcium: 9.3 mg/dL (ref 8.4–10.5)
Chloride: 105 mEq/L (ref 96–112)
Creatinine, Ser: 1.25 mg/dL (ref 0.40–1.50)
GFR: 59.09 mL/min — ABNORMAL LOW (ref >60.00)
Glucose, Bld: 104 mg/dL — ABNORMAL HIGH (ref 70–99)
Potassium: 4.1 mEq/L (ref 3.5–5.1)
Sodium: 137 mEq/L (ref 135–145)

## 2019-07-01 NOTE — Patient Instructions (Signed)

## 2019-07-01 NOTE — Progress Notes (Signed)
Subjective:  Patient ID: Cody Barnes, male    DOB: Aug 06, 1960  Age: 59 y.o. MRN: [id number]  CC: Hypertension  This visit occurred during the SARS-CoV-2 public health emergency.  Safety protocols were in place, including screening questions prior to the visit, additional usage of staff PPE, and extensive cleaning of exam room while observing appropriate contact time as indicated for disinfecting solutions.    HPI Cody Barnes presents for a BP check - He tells me he is doing well on the ARB with no side effects.  He denies dizziness or lightheadedness.  He is active and denies any recent episodes of chest pain, shortness of breath, palpitations, edema, or fatigue.  Outpatient Medications Prior to Visit  Medication Sig Dispense Refill  . atorvastatin (LIPITOR) 20 MG tablet Take 1 tablet (20 mg total) by mouth daily. 90 tablet 3  . irbesartan (AVAPRO) 150 MG tablet Take 1 tablet (150 mg total) by mouth daily. 90 tablet 1   No facility-administered medications prior to visit.    ROS Review of Systems  Constitutional: Negative for diaphoresis, fatigue and unexpected weight change.  HENT: Negative.   Eyes: Negative for visual disturbance.  Respiratory: Negative for cough, chest tightness, shortness of breath and wheezing.   Cardiovascular: Negative for chest pain, palpitations and leg swelling.  Gastrointestinal: Negative for abdominal pain, diarrhea, nausea and vomiting.  Endocrine: Negative.   Genitourinary: Negative.  Negative for difficulty urinating.  Musculoskeletal: Negative.   Skin: Negative.   Neurological: Negative for dizziness, weakness, light-headedness and headaches.  Hematological: Negative for adenopathy. Does not bruise/bleed easily.  Psychiatric/Behavioral: Negative.     Objective:  BP 126/78 (BP Location: Left Arm, Patient Position: Sitting, Cuff Size: Normal)   Pulse 60   Temp 98 F (36.7 C) (Oral)   Resp 16   Ht 6' (1.829 m)   Wt 175 lb (79.4 kg)    SpO2 96%   BMI 23.73 kg/m   BP Readings from Last 3 Encounters:  07/01/19 126/78  02/27/19 (!) 142/92  01/08/18 112/80    Wt Readings from Last 3 Encounters:  07/01/19 175 lb (79.4 kg)  02/27/19 174 lb 8 oz (79.2 kg)  10/17/18 168 lb 8 oz (76.4 kg)    Physical Exam Vitals reviewed.  Constitutional:      Appearance: Normal appearance.  HENT:     Mouth/Throat:     Mouth: Mucous membranes are moist.  Eyes:     General: No scleral icterus.    Conjunctiva/sclera: Conjunctivae normal.  Cardiovascular:     Rate and Rhythm: Normal rate.     Heart sounds: Murmur present. Systolic murmur present with a grade of 2/6. No diastolic murmur. No gallop.   Pulmonary:     Effort: Pulmonary effort is normal.     Breath sounds: No stridor. No wheezing, rhonchi or rales.  Abdominal:     General: Abdomen is flat. Bowel sounds are normal. There is no distension.     Palpations: Abdomen is soft. There is no hepatomegaly or splenomegaly.     Tenderness: There is no abdominal tenderness.  Musculoskeletal:        General: Normal range of motion.     Cervical back: Neck supple.     Right lower leg: No edema.     Left lower leg: No edema.  Lymphadenopathy:     Cervical: No cervical adenopathy.  Skin:    General: Skin is warm and dry.     Coloration: Skin is not pale.  Neurological:     General: No focal deficit present.     Mental Status: He is alert.     Lab Results  Component Value Date   WBC 6.7 02/27/2019   HGB 15.2 02/27/2019   HCT 45.4 02/27/2019   PLT 184.0 02/27/2019   GLUCOSE 104 (H) 07/01/2019   CHOL 149 02/27/2019   TRIG 66.0 02/27/2019   HDL 49.90 02/27/2019   LDLCALC 86 02/27/2019   ALT 27 02/27/2019   AST 18 02/27/2019   NA 137 07/01/2019   K 4.1 07/01/2019   CL 105 07/01/2019   CREATININE 1.25 07/01/2019   BUN 24 (H) 07/01/2019   CO2 25 07/01/2019   TSH 1.54 02/27/2019   PSA 1.13 12/19/2018   INR 1.0 12/10/2013   HGBA1C 5.3 12/28/2015    CT RENAL STONE  STUDY  Result Date: 10/18/2014 CLINICAL DATA:  Left upper flank pain. EXAM: CT ABDOMEN AND PELVIS WITHOUT CONTRAST TECHNIQUE: Multidetector CT imaging of the abdomen and pelvis was performed following the standard protocol without IV contrast. COMPARISON:  None. FINDINGS: Lung bases are clear. Postoperative changes in the heart which are incompletely evaluated on this examination. Negative for free air. Unenhanced CT was performed per clinician order. Lack of IV contrast limits sensitivity and specificity, especially for evaluation of abdominal/pelvic solid viscera. Normal appearance of the gallbladder, liver and spleen. Normal appearance of the pancreas and adrenal glands. No evidence for kidney stones or hydronephrosis. No acute abnormality involving the kidneys or urinary bladder. Possible low-density cyst in the left kidney upper pole. Normal appearance of the seminal vesicles and prostate. Moderate amount of stool in the colon. Normal appearance of the small bowel and appendix. There appears to be bilateral inguinal hernias containing fat. Difficult to exclude previous surgery in the inguinal areas, particularly on the left side. There is no significant free fluid or lymphadenopathy within the abdomen or pelvis. Internal fixation in the left femoral head and proximal left femur. No acute bone abnormalities. IMPRESSION: No acute abnormalities in the abdomen or pelvis. Specifically, no evidence for kidney stones or hydronephrosis. Electronically Signed   By: Richarda Overlie M.D.   On: 10/18/2014 08:53    Assessment & Plan:   Cody Barnes was seen today for hypertension.  Diagnoses and all orders for this visit:  Essential hypertension- His BP is well controlled. His BUN is mildly elevated c/w dehydration. I recommended that he consume more water. His lytes are normal. Will continue the current ARB dose. -     Basic metabolic panel   I am having Cody Barnes maintain his atorvastatin and irbesartan.  No  orders of the defined types were placed in this encounter.    Follow-up: Return in about 6 months (around 12/29/2019).  Sanda Linger, MD

## 2019-10-19 NOTE — Progress Notes (Signed)
Cardiology Office Note   Date:  10/20/2019   ID:  Cody Barnes, DOB [birth year], MRN [id number]  PCP:  Janith Lima, MD    No chief complaint on file.  S/p MV Repair and AV Replacement  Wt Readings from Last 3 Encounters:  10/20/19 178 lb 12.8 oz (81.1 kg)  07/01/19 175 lb (79.4 kg)  02/27/19 174 lb 8 oz (79.2 kg)       History of Present Illness: Cody Barnes is a 59 y.o. male   who has had mitral valve prolapse for many years. He was followed with serial echoes. He was noted to have increased LV size at the time of his last echocardiogram in 2015 with some symptoms of dyspnea on exertion despite preserved LVEF. He was referred for surgery. He chose to go to [facility] clinic for his operation. He underwent mitral valve repair and aortic valve replacement due to aortic insufficiency as well. He chose to haveabioprostheticaorticvalve. Cardiac cath prior to the valve surgery was negative for significant coronary artery disease.  He was started on atorvastatin for elevated cholesterol. Of note, he had a mildly increased ALT before starting the medicine.  Since the last visit, he has received his vaccines.   He walks with his dog 8-10K steps /day.  He hikes as well.  Exercise tolerance is steady.  No recent changes.  Denies : Chest pain. Dizziness. Leg edema. Nitroglycerin use. Orthopnea. Palpitations. Paroxysmal nocturnal dyspnea. Shortness of breath. Syncope.   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   He recently came back from [place] after visiting his father who is 39 years old.     Past Medical History:  Diagnosis Date  . Aortic insufficiency   . Elevated PSA 10/12/2014  . 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 Heart murmur   . Mitral regurgitation   . Mitral valve prolapse   . 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 HIP FRACTURE SURGERY     left  . INGUINAL  HERNIA REPAIR     as infant  . LEFT AND RIGHT HEART CATHETERIZATION WITH CORONARY ANGIOGRAM N/A 12/12/2013   Procedure: LEFT AND RIGHT HEART CATHETERIZATION WITH CORONARY ANGIOGRAM;  Surgeon: Jettie Booze, MD;  Location: [facility] CATH LAB;  Service: Cardiovascular;  Laterality: N/A;  . TEE WITHOUT CARDIOVERSION N/A 11/25/2013   Procedure: TRANSESOPHAGEAL ECHOCARDIOGRAM (TEE);  Surgeon: Sueanne Margarita, MD;  Location: [facility] ENDOSCOPY;  Service: Cardiovascular;  Laterality: N/A;     Current Outpatient Medications  Medication Sig Dispense Refill  . atorvastatin (LIPITOR) 20 MG tablet Take 1 tablet (20 mg total) by mouth daily. 90 tablet 3  . irbesartan (AVAPRO) 150 MG tablet Take 1 tablet (150 mg total) by mouth daily. 90 tablet 1   No current facility-administered medications for this visit.    Allergies:   Patient has no known allergies.    Social History:  The patient  reports that he has never smoked. He has never used smokeless tobacco. He reports current alcohol use of about 5.0 standard drinks of alcohol per week. He reports that he does not use drugs.   Family History:  The patient's family history includes Cancer in his mother.    ROS:  Please see the history of present illness.   Otherwise, review of systems are positive for 3 glasses scotch/week; increased somewhat recently.   All other systems are reviewed and  negative.    PHYSICAL EXAM: VS:  BP (!) 110/50   Pulse 69   Ht 6' (1.829 m)   Wt 178 lb 12.8 oz (81.1 kg)   SpO2 98%   BMI 24.25 kg/m  , BMI Body mass index is 24.25 kg/m. GEN: Well nourished, well developed, in no acute distress  HEENT: normal  Neck: no JVD, carotid bruits, or masses Cardiac: RRR; 2/6 systolic murmurs, no rubs, or gallops,no edema  Respiratory:  clear to auscultation bilaterally, normal work of breathing GI: soft, nontender, nondistended, + BS MS: no deformity or atrophy  Skin: warm and dry, no rash Neuro:  Strength and sensation are intact  Psych: euthymic mood, full affect   EKG:   The ekg ordered in October 2020 demonstrates sinus bradycardia, no ST segment changes   Recent Labs: 02/27/2019: ALT 27; Hemoglobin 15.2; Platelets 184.0; TSH 1.54 07/01/2019: BUN 24; Creatinine, Ser 1.25; Potassium 4.1; Sodium 137   Lipid Panel    Component Value Date/Time   CHOL 149 02/27/2019 0904   CHOL 114 10/04/2016 0757   TRIG 66.0 02/27/2019 0904   HDL 49.90 02/27/2019 0904   HDL 41 10/04/2016 0757   CHOLHDL 3 02/27/2019 0904   VLDL 13.2 02/27/2019 0904   LDLCALC 86 02/27/2019 0904   LDLCALC 61 10/04/2016 0757     Other studies Reviewed: Additional studies/ records that were reviewed today with results demonstrating: echo in 2020: The left ventricle has normal systolic function with an ejection  fraction of 60-65%. The cavity size was normal. Left ventricular diastolic  Doppler parameters are consistent with impaired relaxation.  2. The right ventricle has normal systolic function. The cavity was  normal. There is no increase in right ventricular wall thickness.  3. Right atrial size was mildly dilated.  4. A annuloplasty ring valve is present in the mitral position.  5. No stenosis of the aortic valve.  6. The prosthetic AV appears to be functioning regularly.  7. The aortic root and ascending aorta are normal in size and structure.  8. The interatrial septum was not assessed. .   ASSESSMENT AND PLAN:  1. MV repair/ aortic valve replacement: needs SBE prophylaxis.  No signs of CHF. 2. Hyperlipidemia: Controlled on atorvastatin. LDL 86 in 02/2019.  Whole food plant based diet.  3. Elevated LFTs: ALT normal in 02/2019.  He does drink alcohol about 3-4 nights a week.  He has routine labs with Dr. Yetta Barre said his liver function will be followed on a regular basis. 4. HTN: He does not restrict salt that much.  The current medical regimen is effective;  continue present plan and medications.  Blood pressures controlled.   Continue regular walking as well.    Current medicines are reviewed at length with the patient today.  The patient concerns regarding his medicines were addressed.  The following changes have been made:  No change  Labs/ tests ordered today include:  No orders of the defined types were placed in this encounter.   Recommend 150 minutes/week of aerobic exercise Low fat, low carb, high fiber diet recommended  Disposition:   FU in 1 year   Signed, Lance Muss, MD  10/20/2019 8:29 AM    [facility] [address] [place], [place], [place]  [address] Phone: [phone]; Fax: [phone]

## 2019-10-20 ENCOUNTER — Ambulatory Visit: Payer: 59 | Admitting: Interventional Cardiology

## 2019-10-20 ENCOUNTER — Other Ambulatory Visit: Payer: Self-pay

## 2019-10-20 ENCOUNTER — Encounter: Payer: Self-pay | Admitting: Interventional Cardiology

## 2019-10-20 VITALS — BP 110/50 | HR 69 | Ht 72.0 in | Wt 178.8 lb

## 2019-10-20 DIAGNOSIS — I351 Nonrheumatic aortic (valve) insufficiency: Secondary | ICD-10-CM | POA: Diagnosis not present

## 2019-10-20 DIAGNOSIS — I340 Nonrheumatic mitral (valve) insufficiency: Secondary | ICD-10-CM | POA: Diagnosis not present

## 2019-10-20 DIAGNOSIS — Z953 Presence of xenogenic heart valve: Secondary | ICD-10-CM | POA: Diagnosis not present

## 2019-10-20 DIAGNOSIS — E785 Hyperlipidemia, unspecified: Secondary | ICD-10-CM

## 2019-10-20 DIAGNOSIS — R7989 Other specified abnormal findings of blood chemistry: Secondary | ICD-10-CM

## 2019-10-20 NOTE — Patient Instructions (Signed)
Medication Instructions:  Your physician recommends that you continue on your current medications as directed. Please refer to the Current Medication list given to you today.  *If you need a refill on your cardiac medications before your next appointment, please call your pharmacy*   Lab Work: None ordered  If you have labs (blood work) drawn today and your tests are completely normal, you will receive your results only by: . MyChart Message (if you have MyChart) OR . A paper copy in the mail If you have any lab test that is abnormal or we need to change your treatment, we will call you to review the results.   Testing/Procedures: None ordered   Follow-Up: At [facility], you and your health needs are our priority.  As part of our continuing mission to provide you with exceptional heart care, we have created designated Provider Care Teams.  These Care Teams include your primary Cardiologist (physician) and Advanced Practice Providers (APPs -  Physician Assistants and Nurse Practitioners) who all work together to provide you with the care you need, when you need it.  We recommend signing up for the patient portal called "MyChart".  Sign up information is provided on this After Visit Summary.  MyChart is used to connect with patients for Virtual Visits (Telemedicine).  Patients are able to view lab/test results, encounter notes, upcoming appointments, etc.  Non-urgent messages can be sent to your provider as well.   To learn more about what you can do with MyChart, go to [email or web address].    Your next appointment:   12 month(s)  The format for your next appointment:   In Person  Provider:   You may see Jayadeep Varanasi, MD or one of the following Advanced Practice Providers on your designated Care Team:    Dayna Dunn, PA-C  Michele Lenze, PA-C    Other Instructions  High-Fiber Diet Fiber, also called dietary fiber, is a type of carbohydrate that is found in fruits,  vegetables, whole grains, and beans. A high-fiber diet can have many health benefits. Your health care provider may recommend a high-fiber diet to help:  Prevent constipation. Fiber can make your bowel movements more regular.  Lower your cholesterol.  Relieve the following conditions: ? Swelling of veins in the anus (hemorrhoids). ? Swelling and irritation (inflammation) of specific areas of the digestive tract (uncomplicated diverticulosis). ? A problem of the large intestine (colon) that sometimes causes pain and diarrhea (irritable bowel syndrome, IBS).  Prevent overeating as part of a weight-loss plan.  Prevent heart disease, type 2 diabetes, and certain cancers. What is my plan? The recommended daily fiber intake in grams (g) includes:  38 g for men age 50 or younger.  30 g for men over age 50.  25 g for women age 50 or younger.  21 g for women over age 50. You can get the recommended daily intake of dietary fiber by:  Eating a variety of fruits, vegetables, grains, and beans.  Taking a fiber supplement, if it is not possible to get enough fiber through your diet. What do I need to know about a high-fiber diet?  It is better to get fiber through food sources rather than from fiber supplements. There is not a lot of research about how effective supplements are.  Always check the fiber content on the nutrition facts label of any prepackaged food. Look for foods that contain 5 g of fiber or more per serving.  Talk with a diet and nutrition specialist (  dietitian) if you have questions about specific foods that are recommended or not recommended for your medical condition, especially if those foods are not listed below.  Gradually increase how much fiber you consume. If you increase your intake of dietary fiber too quickly, you may have bloating, cramping, or gas.  Drink plenty of water. Water helps you to digest fiber. What are tips for following this plan?  Eat a wide  variety of high-fiber foods.  Make sure that half of the grains that you eat each day are whole grains.  Eat breads and cereals that are made with whole-grain flour instead of refined flour or white flour.  Eat brown rice, bulgur wheat, or millet instead of white rice.  Start the day with a breakfast that is high in fiber, such as a cereal that contains 5 g of fiber or more per serving.  Use beans in place of meat in soups, salads, and pasta dishes.  Eat high-fiber snacks, such as berries, raw vegetables, nuts, and popcorn.  Choose whole fruits and vegetables instead of processed forms like juice or sauce. What foods can I eat?  Fruits Berries. Pears. Apples. Oranges. Avocado. Prunes and raisins. Dried figs. Vegetables Sweet potatoes. Spinach. Kale. Artichokes. Cabbage. Broccoli. Cauliflower. Green peas. Carrots. Squash. Grains Whole-grain breads. Multigrain cereal. Oats and oatmeal. Brown rice. Barley. Bulgur wheat. Millet. Quinoa. Bran muffins. Popcorn. Rye wafer crackers. Meats and other proteins Navy, kidney, and pinto beans. Soybeans. Split peas. Lentils. Nuts and seeds. Dairy Fiber-fortified yogurt. Beverages Fiber-fortified soy milk. Fiber-fortified orange juice. Other foods Fiber bars. The items listed above may not be a complete list of recommended foods and beverages. Contact a dietitian for more options. What foods are not recommended? Fruits Fruit juice. Cooked, strained fruit. Vegetables Fried potatoes. Canned vegetables. Well-cooked vegetables. Grains White bread. Pasta made with refined flour. White rice. Meats and other proteins Fatty cuts of meat. Fried chicken or fried fish. Dairy Milk. Yogurt. Cream cheese. Sour cream. Fats and oils Butters. Beverages Soft drinks. Other foods Cakes and pastries. The items listed above may not be a complete list of foods and beverages to avoid. Contact a dietitian for more information. Summary  Fiber is a type of  carbohydrate. It is found in fruits, vegetables, whole grains, and beans.  There are many health benefits of eating a high-fiber diet, such as preventing constipation, lowering blood cholesterol, helping with weight loss, and reducing your risk of heart disease, diabetes, and certain cancers.  Gradually increase your intake of fiber. Increasing too fast can result in cramping, bloating, and gas. Drink plenty of water while you increase your fiber.  The best sources of fiber include whole fruits and vegetables, whole grains, nuts, seeds, and beans. This information is not intended to replace advice given to you by your health care provider. Make sure you discuss any questions you have with your health care provider. Document Revised: 03/19/2017 Document Reviewed: 03/19/2017 Elsevier Patient Education  2020 Elsevier Inc.   

## 2019-10-22 ENCOUNTER — Other Ambulatory Visit: Payer: Self-pay | Admitting: Interventional Cardiology

## 2019-10-22 DIAGNOSIS — E785 Hyperlipidemia, unspecified: Secondary | ICD-10-CM

## 2019-10-24 ENCOUNTER — Other Ambulatory Visit: Payer: Self-pay | Admitting: Internal Medicine

## 2019-10-24 DIAGNOSIS — I10 Essential (primary) hypertension: Secondary | ICD-10-CM

## 2019-12-26 LAB — PSA: PSA: 1.88

## 2019-12-30 ENCOUNTER — Telehealth: Payer: Self-pay

## 2019-12-30 ENCOUNTER — Ambulatory Visit (INDEPENDENT_AMBULATORY_CARE_PROVIDER_SITE_OTHER): Payer: 59 | Admitting: Internal Medicine

## 2019-12-30 ENCOUNTER — Encounter: Payer: Self-pay | Admitting: Internal Medicine

## 2019-12-30 ENCOUNTER — Other Ambulatory Visit: Payer: Self-pay

## 2019-12-30 VITALS — BP 126/84 | HR 69 | Temp 98.2°F | Resp 16 | Ht 72.0 in | Wt 181.0 lb

## 2019-12-30 DIAGNOSIS — Z1211 Encounter for screening for malignant neoplasm of colon: Secondary | ICD-10-CM

## 2019-12-30 DIAGNOSIS — I10 Essential (primary) hypertension: Secondary | ICD-10-CM

## 2019-12-30 LAB — BASIC METABOLIC PANEL WITH GFR
BUN: 22 mg/dL (ref 7–25)
CO2: 26 mmol/L (ref 20–32)
Calcium: 9.5 mg/dL (ref 8.6–10.3)
Chloride: 104 mmol/L (ref 98–110)
Creat: 1.19 mg/dL (ref 0.70–1.33)
GFR, Est African American: 77 mL/min/{1.73_m2} (ref >=60)
GFR, Est Non African American: 66 mL/min/{1.73_m2} (ref >=60)
Glucose, Bld: 107 mg/dL — ABNORMAL HIGH (ref 65–99)
Potassium: 4.6 mmol/L (ref 3.5–5.3)
Sodium: 137 mmol/L (ref 135–146)

## 2019-12-30 NOTE — Patient Instructions (Signed)

## 2019-12-30 NOTE — Progress Notes (Signed)
Subjective:  Patient ID: Cody Barnes, male    DOB: 1960-11-17  Age: 59 y.o. MRN: [id number]  CC: Hypertension  This visit occurred during the SARS-CoV-2 public health emergency.  Safety protocols were in place, including screening questions prior to the visit, additional usage of staff PPE, and extensive cleaning of exam room while observing appropriate contact time as indicated for disinfecting solutions.    HPI Cody Barnes presents for f/up - He tells me his blood pressure has been well controlled.  He is tolerating the ARB well with no dizziness or lightheadedness.  He is active and denies any recent episodes of chest pain, shortness of breath, palpitations, edema, or fatigue.  Outpatient Medications Prior to Visit  Medication Sig Dispense Refill  . atorvastatin (LIPITOR) 20 MG tablet TAKE 1 TABLET BY MOUTH EVERY DAY 90 tablet 3  . irbesartan (AVAPRO) 150 MG tablet TAKE 1 TABLET BY MOUTH EVERY DAY 90 tablet 1   No facility-administered medications prior to visit.    ROS Review of Systems  Constitutional: Negative for appetite change, diaphoresis, fatigue and unexpected weight change.  HENT: Negative.   Eyes: Negative for visual disturbance.  Respiratory: Negative for cough, chest tightness, shortness of breath and wheezing.   Cardiovascular: Negative for chest pain, palpitations and leg swelling.  Gastrointestinal: Negative for abdominal pain, constipation, diarrhea, nausea and vomiting.  Endocrine: Negative.   Genitourinary: Negative.  Negative for difficulty urinating.  Musculoskeletal: Negative for back pain and neck pain.  Skin: Negative for color change.  Neurological: Negative.  Negative for dizziness, weakness and light-headedness.  Hematological: Negative for adenopathy. Does not bruise/bleed easily.  Psychiatric/Behavioral: Negative.     Objective:  BP 126/84 (BP Location: Left Arm, Patient Position: Sitting, Cuff Size: Normal)   Pulse 69   Temp 98.2 F  (36.8 C) (Oral)   Resp 16   Ht 6' (1.829 m)   Wt 181 lb (82.1 kg)   SpO2 98%   BMI 24.55 kg/m   BP Readings from Last 3 Encounters:  12/30/19 126/84  10/20/19 (!) 110/50  07/01/19 126/78    Wt Readings from Last 3 Encounters:  12/30/19 181 lb (82.1 kg)  10/20/19 178 lb 12.8 oz (81.1 kg)  07/01/19 175 lb (79.4 kg)    Physical Exam Vitals reviewed.  Constitutional:      Appearance: Normal appearance.  HENT:     Nose: Nose normal.     Mouth/Throat:     Mouth: Mucous membranes are moist.  Eyes:     General: No scleral icterus.    Conjunctiva/sclera: Conjunctivae normal.  Cardiovascular:     Rate and Rhythm: Normal rate and regular rhythm.     Heart sounds: Murmur heard.  Systolic murmur is present with a grade of 3/6.  No gallop.   Pulmonary:     Breath sounds: No stridor. No wheezing, rhonchi or rales.  Abdominal:     General: Abdomen is flat. Bowel sounds are normal.     Palpations: Abdomen is soft. There is no hepatomegaly, splenomegaly or mass.  Musculoskeletal:     Cervical back: Neck supple.     Right lower leg: No edema.     Left lower leg: No edema.  Lymphadenopathy:     Cervical: No cervical adenopathy.  Skin:    General: Skin is warm and dry.  Neurological:     General: No focal deficit present.     Mental Status: He is alert.     Lab Results  Component Value  Date   WBC 6.7 02/27/2019   HGB 15.2 02/27/2019   HCT 45.4 02/27/2019   PLT 184.0 02/27/2019   GLUCOSE 104 (H) 07/01/2019   CHOL 149 02/27/2019   TRIG 66.0 02/27/2019   HDL 49.90 02/27/2019   LDLCALC 86 02/27/2019   ALT 27 02/27/2019   AST 18 02/27/2019   NA 137 07/01/2019   K 4.1 07/01/2019   CL 105 07/01/2019   CREATININE 1.25 07/01/2019   BUN 24 (H) 07/01/2019   CO2 25 07/01/2019   TSH 1.54 02/27/2019   PSA 1.88 12/26/2019   INR 1.0 12/10/2013   HGBA1C 5.3 12/28/2015    CT RENAL STONE STUDY  Result Date: 10/18/2014 CLINICAL DATA:  Left upper flank pain. EXAM: CT ABDOMEN  AND PELVIS WITHOUT CONTRAST TECHNIQUE: Multidetector CT imaging of the abdomen and pelvis was performed following the standard protocol without IV contrast. COMPARISON:  None. FINDINGS: Lung bases are clear. Postoperative changes in the heart which are incompletely evaluated on this examination. Negative for free air. Unenhanced CT was performed per clinician order. Lack of IV contrast limits sensitivity and specificity, especially for evaluation of abdominal/pelvic solid viscera. Normal appearance of the gallbladder, liver and spleen. Normal appearance of the pancreas and adrenal glands. No evidence for kidney stones or hydronephrosis. No acute abnormality involving the kidneys or urinary bladder. Possible low-density cyst in the left kidney upper pole. Normal appearance of the seminal vesicles and prostate. Moderate amount of stool in the colon. Normal appearance of the small bowel and appendix. There appears to be bilateral inguinal hernias containing fat. Difficult to exclude previous surgery in the inguinal areas, particularly on the left side. There is no significant free fluid or lymphadenopathy within the abdomen or pelvis. Internal fixation in the left femoral head and proximal left femur. No acute bone abnormalities. IMPRESSION: No acute abnormalities in the abdomen or pelvis. Specifically, no evidence for kidney stones or hydronephrosis. Electronically Signed   By: Richarda Overlie M.D.   On: 10/18/2014 08:53    Assessment & Plan:   Cody Barnes was seen today for hypertension.  Diagnoses and all orders for this visit:  Essential hypertension- His blood pressure is well controlled.  Electrolytes and renal function are normal.  Will continue the current dose of the ARB. -     BASIC METABOLIC PANEL WITH GFR; Future -     BASIC METABOLIC PANEL WITH GFR  Colon cancer screening -     Cologuard   I am having Cody Barnes maintain his atorvastatin and irbesartan.  No orders of the defined types were  placed in this encounter.    Follow-up: Return in about 3 months (around 03/31/2020).  Cody Linger, MD

## 2020-01-02 NOTE — Telephone Encounter (Signed)
Updated IMM record

## 2020-02-04 LAB — COLOGUARD: Cologuard: NEGATIVE

## 2020-02-10 ENCOUNTER — Encounter: Payer: Self-pay | Admitting: Internal Medicine

## 2020-02-10 NOTE — Progress Notes (Signed)
Outside notes received. Information abstracted. Notes sent to scan.  

## 2020-05-04 ENCOUNTER — Other Ambulatory Visit: Payer: Self-pay | Admitting: Internal Medicine

## 2020-05-04 DIAGNOSIS — I10 Essential (primary) hypertension: Secondary | ICD-10-CM

## 2020-05-17 ENCOUNTER — Telehealth: Payer: Self-pay | Admitting: Internal Medicine

## 2020-05-17 ENCOUNTER — Other Ambulatory Visit: Payer: Self-pay | Admitting: Internal Medicine

## 2020-05-17 DIAGNOSIS — I10 Essential (primary) hypertension: Secondary | ICD-10-CM

## 2020-05-17 MED ORDER — IRBESARTAN 150 MG PO TABS: 150.0000 mg | ORAL_TABLET | Freq: Every day | ORAL | 1 refills | Status: DC

## 2020-05-17 NOTE — Telephone Encounter (Signed)
irbesartan (AVAPRO) 150 MG tablet [id number] [place], [place] - [address] Phone:  [phone]  Fax:  [phone]     Requesting a refill  Last seen- 08.03.21 Next apt- n/a

## 2020-07-14 ENCOUNTER — Telehealth (INDEPENDENT_AMBULATORY_CARE_PROVIDER_SITE_OTHER): Payer: 59 | Admitting: Family Medicine

## 2020-07-14 ENCOUNTER — Encounter: Payer: Self-pay | Admitting: Family Medicine

## 2020-07-14 VITALS — Ht 72.0 in | Wt 181.0 lb

## 2020-07-14 DIAGNOSIS — J069 Acute upper respiratory infection, unspecified: Secondary | ICD-10-CM

## 2020-07-14 DIAGNOSIS — R0981 Nasal congestion: Secondary | ICD-10-CM | POA: Diagnosis not present

## 2020-07-14 MED ORDER — AMOXICILLIN-POT CLAVULANATE 875-125 MG PO TABS
1.0000 | ORAL_TABLET | Freq: Two times a day (BID) | ORAL | 0 refills | Status: DC
Start: 2020-07-14 — End: 2020-07-26

## 2020-07-14 NOTE — Progress Notes (Signed)
Patient: Cody Barnes MRN: [id number] DOB: 26-Aug-1960 PCP: Etta Grandchild, MD     I connected with Cody Barnes on 07/14/20 at 2:58pm by a video enabled telemedicine application and verified that I am speaking with the correct person using two identifiers.  Location patient: Home Location provider: [facility], Office Persons participating in this virtual visit: Cody Barnes and Dr. Artis Flock   I discussed the limitations of evaluation and management by telemedicine and the availability of in person appointments. The patient expressed understanding and agreed to proceed.   Subjective:  Chief Complaint  Patient presents with  . Cough    HPI: The patient is a 60 y.o. male who presents today for cough x 1 week. Tested negative x 3 for covid, 1 pcr and 2 at home tests. He has had a stuffy nose, clogged ears where he can barely hear. He denies any sinus pain or pressure and denies any pain in his teeth. Hd has been on sudafed nasal medication which helps for a few hours.  He has had no fever/chills. He states several people in his office tested positive over the last 2 weeks. He denies any shortness of breath or wheezing. He has no chest tightness or wheezing. Leaving for [place] on Sunday.   He has been triple vaccinated.   Review of Systems  Constitutional: Negative for chills and fever.  HENT: Positive for congestion. Negative for ear discharge, sinus pressure, sinus pain and sore throat.   Respiratory: Positive for cough. Negative for chest tightness, shortness of breath and wheezing.   Cardiovascular: Negative for chest pain, palpitations and leg swelling.  Neurological: Positive for headaches.    Allergies Patient has No Known Allergies.  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  Surgical History Patient  has a past surgical history that includes Hip fracture surgery; Inguinal hernia repair; TEE without cardioversion (N/A, 11/25/2013); and left and right heart catheterization with coronary angiogram (N/A, 12/12/2013).  Family History Pateint's family history includes Cancer in his mother.  Social History Patient  reports that he has never smoked. He has never used smokeless tobacco. He reports current alcohol use of about 5.0 standard drinks of alcohol per week. He reports that he does not use drugs.    Objective: Vitals:   07/14/20 1429  Weight: 181 lb (82.1 kg)  Height: 6' (1.829 m)    Body mass index is 24.55 kg/m.  Physical Exam Vitals reviewed.  Constitutional:      General: He is not in acute distress.    Appearance: Normal appearance. He is normal weight. He is not ill-appearing.     Comments: Sounds hyperresonant due to congestion.   HENT:     Head: Normocephalic and atraumatic.     Nose: Congestion present.  Pulmonary:     Effort: Pulmonary effort is normal.  Neurological:     General: No focal deficit present.     Mental Status: He is alert and oriented to person, place, and time.  Psychiatric:        Mood and Affect: Mood normal.        Behavior: Behavior normal.        Assessment/plan: 1. Nasal congestion Recommended he start flonase night and cool mist humidifier. No signs of bacterial sinus infection at this point. He is leaving for  [place] on Sunday so will send in pocket px of augmentin to have if any fever/sinus pain or pressure or teeth hurting but otherwise advised to do conservative therapy.   2. Viral URI Recommended he come get PCR testing for covid as he sounds very omicron in nature. Had very early pcr testing and have seen a lot of early tests negative. He will come to clinic tomorrow night. Start conservative therapy with flonase, cool mist humidifier and robitussin DM. Want him  to stop sudafed with heart history. precautions given.     Return if symptoms worsen or fail to improve.     Orland Mustard, MD [facility]  07/14/2020

## 2020-07-14 NOTE — Patient Instructions (Addendum)
-  start flonase at night  -cool mist humidifier at night. -stop sudafed, do not like using in any heart patient or hypertensive patients as can increase blood pressure -robitussin DM as needed   -pocket px of augmentin sent in only to start if teeth start to hurt or worsening sinus pain or pressure after 7-10 days.   -come get tested for covid tomorrow night.   Please let me know if not getting better! Dr. Artis Flock

## 2020-07-15 ENCOUNTER — Other Ambulatory Visit: Payer: 59

## 2020-07-15 DIAGNOSIS — Z20822 Contact with and (suspected) exposure to covid-19: Secondary | ICD-10-CM

## 2020-07-16 ENCOUNTER — Encounter: Payer: Self-pay | Admitting: Family Medicine

## 2020-07-16 LAB — NOVEL CORONAVIRUS, NAA: SARS-CoV-2, NAA: NOT DETECTED

## 2020-07-16 LAB — SARS-COV-2, NAA 2 DAY TAT

## 2020-07-16 NOTE — Telephone Encounter (Signed)
Pt is wanting to know his next steps, as he is not improving. Relayed to pt his test has NOT come back and that we are still waiting on the results.

## 2020-07-19 ENCOUNTER — Telehealth: Payer: Self-pay

## 2020-07-19 NOTE — Telephone Encounter (Signed)
Pt needs to follow up with PCP

## 2020-07-19 NOTE — Telephone Encounter (Signed)
Pt.'s wife states pt's ears/hearing are still very muffled. He did fly yesterday and the flight did not hurt his ears. Pt and wife are wondering if he should go to an ENT or come back to Dr. Artis Flock. Please advise

## 2020-07-20 ENCOUNTER — Telehealth: Payer: Self-pay

## 2020-07-20 NOTE — Telephone Encounter (Signed)
FYI

## 2020-07-20 NOTE — Telephone Encounter (Signed)
Patient's wife returned Cody Barnes's call to get scheduled for an appointment, Eunice Blase states they cant get an appointment with PCP until March. Requested for an appointment with [facility] for 5:30 PM on Wednesday or between the hours of 7 am - 8 am on Thursday morning because they were traveling out of town.

## 2020-07-20 NOTE — Telephone Encounter (Signed)
Please advise message below  °

## 2020-07-20 NOTE — Telephone Encounter (Signed)
Patient's wife is requesting that this prescription be [id number] [place],  - [address]

## 2020-07-20 NOTE — Telephone Encounter (Signed)
Pt is requesting that the [facility] [address], [place], [place]

## 2020-07-20 NOTE — Telephone Encounter (Signed)
I spoke with pt to make him aware of office hours. Pt was prompted to schedule appointment with the front desk. Pt is scheduled for 07/26/2020.

## 2020-07-21 MED ORDER — PREDNISONE 20 MG PO TABS: 40.0000 mg | ORAL_TABLET | Freq: Every day | ORAL | 0 refills | Status: DC

## 2020-07-21 NOTE — Addendum Note (Signed)
Addended by: Orland Mustard on: 07/21/2020 08:44 AM   Modules accepted: Orders

## 2020-07-26 ENCOUNTER — Ambulatory Visit (INDEPENDENT_AMBULATORY_CARE_PROVIDER_SITE_OTHER): Payer: 59 | Admitting: Family Medicine

## 2020-07-26 ENCOUNTER — Encounter: Payer: Self-pay | Admitting: Family Medicine

## 2020-07-26 ENCOUNTER — Other Ambulatory Visit: Payer: Self-pay

## 2020-07-26 VITALS — BP 128/80 | HR 56 | Temp 98.4°F | Ht 72.0 in | Wt 187.2 lb

## 2020-07-26 DIAGNOSIS — H9312 Tinnitus, left ear: Secondary | ICD-10-CM

## 2020-07-26 DIAGNOSIS — H6982 Other specified disorders of Eustachian tube, left ear: Secondary | ICD-10-CM | POA: Diagnosis not present

## 2020-07-26 NOTE — Progress Notes (Signed)
Patient: Cody Barnes MRN: [id number] DOB: Sep 23, 1960 PCP: Etta Grandchild, MD     Subjective:  Chief Complaint  Patient presents with  . Ear Fullness    Pt finished steroid. Pt says that there is ringing in the left ear.    HPI: The patient is a 60 y.o. male who presents today for ear fullness. He complains of nasal congestion and ringing in the left ear. He finished steroid today. He thinks the steroids helped in his right ear, but his left ear continues to feel clogged. He describes it as muffled. He denies any hearing loss. He has a constant noise in his left ear. He states kinda ringing. Has been going on x1.5 weeks. He has no drainage from his ear, no pain in his ear. I also started him on flonase when I saw him for virtual appointment about 1.5 weeks ago on 07/14/20. I also gave pocket px of augmentin since he was leaving on trip, but had no indication of infection at that time. He also flew to [place] and had no issues flying with his ear.   covid test was negative.   Review of Systems  Constitutional: Negative for chills, fatigue and fever.  HENT: Positive for tinnitus. Negative for congestion, ear discharge, ear pain, sinus pressure and sinus pain.   Respiratory: Negative for cough and shortness of breath.   Neurological: Negative for dizziness and headaches.    Allergies Patient has No Known Allergies.  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  Surgical History Patient  has a past surgical history that includes Hip fracture surgery; Inguinal hernia repair; TEE without cardioversion (N/A, 11/25/2013); and left and right heart catheterization with coronary angiogram (N/A, 12/12/2013).  Family History Pateint's family history includes Cancer  in his mother.  Social History Patient  reports that he has never smoked. He has never used smokeless tobacco. He reports current alcohol use of about 5.0 standard drinks of alcohol per week. He reports that he does not use drugs.    Objective: Vitals:   07/26/20 1131  BP: 128/80  Pulse: (!) 56  Temp: 98.4 F (36.9 C)  TempSrc: Temporal  SpO2: 95%  Weight: 187 lb 3.2 oz (84.9 kg)  Height: 6' (1.829 m)    Body mass index is 25.39 kg/m.  Physical Exam Vitals reviewed.  Constitutional:      General: He is not in acute distress.    Appearance: Normal appearance. He is normal weight. He is not ill-appearing.  HENT:     Head: Normocephalic and atraumatic.     Right Ear: Tympanic membrane, ear canal and external ear normal.     Left Ear: Tympanic membrane and external ear normal.     Ears:     Comments: Left canal bent more than normal.     Nose: Nose normal. No congestion.     Mouth/Throat:     Mouth: Mucous membranes are moist.  Cardiovascular:     Rate and Rhythm: Normal rate and regular rhythm.     Heart sounds: Normal heart sounds.  Pulmonary:     Effort: Pulmonary effort is normal.     Breath sounds: Normal breath sounds.  Abdominal:     General: Abdomen is flat. Bowel sounds are normal.     Palpations: Abdomen is soft.  Musculoskeletal:  Cervical back: Normal range of motion and neck supple.  Lymphadenopathy:     Cervical: No cervical adenopathy.  Neurological:     Mental Status: He is alert.        Assessment/plan: 1. Eustachian tube dysfunction, left Discussed continue flonase. Tinnitus may be from this as well. Continue equalizing pressure exercises. Will send to ENT per his request as well.   2. Tinnitus, left  - Ambulatory referral to ENT    This visit occurred during the SARS-CoV-2 public health emergency.  Safety protocols were in place, including screening questions prior to the visit, additional usage of staff PPE, and extensive cleaning of  exam room while observing appropriate contact time as indicated for disinfecting solutions.     Return if symptoms worsen or fail to improve.   Orland Mustard, MD [facility]   07/26/2020

## 2020-07-26 NOTE — Patient Instructions (Signed)
I think you have eustachian tube dysfunction which we treat with the flonase. Can also cause the ringing. Since has been bothering you for a bit will also refer to ENT. Let me know if any issues. Continue the flonase.  So nice to meet you in person!  Dr. Sherrye Payor Tube Dysfunction  Eustachian tube dysfunction refers to a condition in which a blockage develops in the narrow passage that connects the middle ear to the back of the nose (eustachian tube). The eustachian tube regulates air pressure in the middle ear by letting air move between the ear and nose. It also helps to drain fluid from the middle ear space. Eustachian tube dysfunction can affect one or both ears. When the eustachian tube does not function properly, air pressure, fluid, or both can build up in the middle ear. What are the causes? This condition occurs when the eustachian tube becomes blocked or cannot open normally. Common causes of this condition include:  Ear infections.  Colds and other infections that affect the nose, mouth, and throat (upper respiratory tract).  Allergies.  Irritation from cigarette smoke.  Irritation from stomach acid coming up into the esophagus (gastroesophageal reflux). The esophagus is the tube that carries food from the mouth to the stomach.  Sudden changes in air pressure, such as from descending in an airplane or scuba diving.  Abnormal growths in the nose or throat, such as: ? Growths that line the nose (nasal polyps). ? Abnormal growth of cells (tumors). ? Enlarged tissue at the back of the throat (adenoids). What increases the risk? You are more likely to develop this condition if:  You smoke.  You are overweight.  You are a child who has: ? Certain birth defects of the mouth, such as cleft palate. ? Large tonsils or adenoids. What are the signs or symptoms? Common symptoms of this condition include:  A feeling of fullness in the ear.  Ear pain.  Clicking or  popping noises in the ear.  Ringing in the ear.  Hearing loss.  Loss of balance.  Dizziness. Symptoms may get worse when the air pressure around you changes, such as when you travel to an area of high elevation, fly on an airplane, or go scuba diving. How is this diagnosed? This condition may be diagnosed based on:  Your symptoms.  A physical exam of your ears, nose, and throat.  Tests, such as those that measure: ? The movement of your eardrum (tympanogram). ? Your hearing (audiometry). How is this treated? Treatment depends on the cause and severity of your condition.  In mild cases, you may relieve your symptoms by moving air into your ears. This is called "popping the ears."  In more severe cases, or if you have symptoms of fluid in your ears, treatment may include: ? Medicines to relieve congestion (decongestants). ? Medicines that treat allergies (antihistamines). ? Nasal sprays or ear drops that contain medicines that reduce swelling (steroids). ? A procedure to drain the fluid in your eardrum (myringotomy). In this procedure, a small tube is placed in the eardrum to:  Drain the fluid.  Restore the air in the middle ear space. ? A procedure to insert a balloon device through the nose to inflate the opening of the eustachian tube (balloon dilation). Follow these instructions at home: Lifestyle  Do not do any of the following until your health care provider approves: ? Travel to high altitudes. ? Fly in airplanes. ? Work in a Estate agent  or room. ? Scuba dive.  Do not use any products that contain nicotine or tobacco, such as cigarettes and e-cigarettes. If you need help quitting, ask your health care provider.  Keep your ears dry. Wear fitted earplugs during showering and bathing. Dry your ears completely after. General instructions  Take over-the-counter and prescription medicines only as told by your health care provider.  Use techniques to help pop  your ears as recommended by your health care provider. These may include: ? Chewing gum. ? Yawning. ? Frequent, forceful swallowing. ? Closing your mouth, holding your nose closed, and gently blowing as if you are trying to blow air out of your nose.  Keep all follow-up visits as told by your health care provider. This is important. Contact a health care provider if:  Your symptoms do not go away after treatment.  Your symptoms come back after treatment.  You are unable to pop your ears.  You have: ? A fever. ? Pain in your ear. ? Pain in your head or neck. ? Fluid draining from your ear.  Your hearing suddenly changes.  You become very dizzy.  You lose your balance. Summary  Eustachian tube dysfunction refers to a condition in which a blockage develops in the eustachian tube.  It can be caused by ear infections, allergies, inhaled irritants, or abnormal growths in the nose or throat.  Symptoms include ear pain, hearing loss, or ringing in the ears.  Mild cases are treated with maneuvers to unblock the ears, such as yawning or ear popping.  Severe cases are treated with medicines. Surgery may also be done (rare). This information is not intended to replace advice given to you by your health care provider. Make sure you discuss any questions you have with your health care provider. Document Revised: 09/04/2017 Document Reviewed: 09/04/2017 Elsevier Patient Education  2021 [employer].

## 2020-08-18 ENCOUNTER — Ambulatory Visit (INDEPENDENT_AMBULATORY_CARE_PROVIDER_SITE_OTHER): Payer: 59 | Admitting: Otolaryngology

## 2020-11-10 ENCOUNTER — Other Ambulatory Visit: Payer: Self-pay

## 2020-11-11 ENCOUNTER — Ambulatory Visit (INDEPENDENT_AMBULATORY_CARE_PROVIDER_SITE_OTHER): Payer: 59 | Admitting: Internal Medicine

## 2020-11-11 ENCOUNTER — Encounter: Payer: Self-pay | Admitting: Internal Medicine

## 2020-11-11 ENCOUNTER — Ambulatory Visit (INDEPENDENT_AMBULATORY_CARE_PROVIDER_SITE_OTHER): Payer: 59

## 2020-11-11 VITALS — BP 118/82 | HR 60 | Temp 97.8°F | Resp 16 | Ht 65.25 in | Wt 187.0 lb

## 2020-11-11 DIAGNOSIS — I351 Nonrheumatic aortic (valve) insufficiency: Secondary | ICD-10-CM | POA: Diagnosis not present

## 2020-11-11 DIAGNOSIS — I340 Nonrheumatic mitral (valve) insufficiency: Secondary | ICD-10-CM

## 2020-11-11 DIAGNOSIS — M79652 Pain in left thigh: Secondary | ICD-10-CM | POA: Diagnosis not present

## 2020-11-11 DIAGNOSIS — Z125 Encounter for screening for malignant neoplasm of prostate: Secondary | ICD-10-CM | POA: Diagnosis not present

## 2020-11-11 DIAGNOSIS — E785 Hyperlipidemia, unspecified: Secondary | ICD-10-CM

## 2020-11-11 DIAGNOSIS — Z0000 Encounter for general adult medical examination without abnormal findings: Secondary | ICD-10-CM

## 2020-11-11 DIAGNOSIS — I10 Essential (primary) hypertension: Secondary | ICD-10-CM

## 2020-11-11 DIAGNOSIS — R9431 Abnormal electrocardiogram [ECG] [EKG]: Secondary | ICD-10-CM

## 2020-11-11 DIAGNOSIS — Z803 Family history of malignant neoplasm of breast: Secondary | ICD-10-CM

## 2020-11-11 LAB — HEPATIC FUNCTION PANEL
ALT: 30 U/L (ref 0–53)
AST: 19 U/L (ref 0–37)
Albumin: 4.6 g/dL (ref 3.5–5.2)
Alkaline Phosphatase: 86 U/L (ref 39–117)
Bilirubin, Direct: 0.1 mg/dL (ref 0.0–0.3)
Total Bilirubin: 0.7 mg/dL (ref 0.2–1.2)
Total Protein: 6.6 g/dL (ref 6.0–8.3)

## 2020-11-11 LAB — URINALYSIS, ROUTINE W REFLEX MICROSCOPIC
Bilirubin Urine: NEGATIVE
Hgb urine dipstick: NEGATIVE
Ketones, ur: NEGATIVE
Leukocytes,Ua: NEGATIVE
Nitrite: NEGATIVE
RBC / HPF: NONE SEEN (ref 0–?)
Specific Gravity, Urine: 1.025 (ref 1.000–1.030)
Total Protein, Urine: NEGATIVE
Urine Glucose: NEGATIVE
Urobilinogen, UA: 0.2 (ref 0.0–1.0)
pH: 6 (ref 5.0–8.0)

## 2020-11-11 LAB — BASIC METABOLIC PANEL
BUN: 20 mg/dL (ref 6–23)
CO2: 28 mEq/L (ref 19–32)
Calcium: 9.4 mg/dL (ref 8.4–10.5)
Chloride: 104 mEq/L (ref 96–112)
Creatinine, Ser: 1.19 mg/dL (ref 0.40–1.50)
GFR: 66.39 mL/min (ref >60.00)
Glucose, Bld: 94 mg/dL (ref 70–99)
Potassium: 4.7 mEq/L (ref 3.5–5.1)
Sodium: 139 mEq/L (ref 135–145)

## 2020-11-11 LAB — CBC WITH DIFFERENTIAL/PLATELET
Basophils Absolute: 0 10*3/uL (ref 0.0–0.1)
Basophils Relative: 0.6 % (ref 0.0–3.0)
Eosinophils Absolute: 0.2 10*3/uL (ref 0.0–0.7)
Eosinophils Relative: 3.3 % (ref 0.0–5.0)
HCT: 43.2 % (ref 39.0–52.0)
Hemoglobin: 14.7 g/dL (ref 13.0–17.0)
Lymphocytes Relative: 21.2 % (ref 12.0–46.0)
Lymphs Abs: 1.5 10*3/uL (ref 0.7–4.0)
MCHC: 33.9 g/dL (ref 30.0–36.0)
MCV: 90.6 fl (ref 78.0–100.0)
Monocytes Absolute: 0.9 10*3/uL (ref 0.1–1.0)
Monocytes Relative: 12.4 % — ABNORMAL HIGH (ref 3.0–12.0)
Neutro Abs: 4.5 10*3/uL (ref 1.4–7.7)
Neutrophils Relative %: 62.5 % (ref 43.0–77.0)
Platelets: 188 10*3/uL (ref 150.0–400.0)
RBC: 4.77 Mil/uL (ref 4.22–5.81)
RDW: 13 % (ref 11.5–15.5)
WBC: 7.2 10*3/uL (ref 4.0–10.5)

## 2020-11-11 LAB — LIPID PANEL
Cholesterol: 171 mg/dL (ref 0–200)
HDL: 48.2 mg/dL (ref >39.00)
LDL Cholesterol: 104 mg/dL — ABNORMAL HIGH (ref 0–99)
NonHDL: 123.07
Total CHOL/HDL Ratio: 4
Triglycerides: 93 mg/dL (ref 0.0–149.0)
VLDL: 18.6 mg/dL (ref 0.0–40.0)

## 2020-11-11 LAB — TSH: TSH: 1.68 u[IU]/mL (ref 0.35–4.50)

## 2020-11-11 LAB — PSA: PSA: 1.71 ng/mL (ref 0.10–4.00)

## 2020-11-11 LAB — CK: Total CK: 85 U/L (ref 7–232)

## 2020-11-11 LAB — SEDIMENTATION RATE: Sed Rate: 1 mm/hr (ref 0–20)

## 2020-11-11 NOTE — Patient Instructions (Signed)
Health Maintenance, Male Adopting a healthy lifestyle and getting preventive care are important in promoting health and wellness. Ask your health care provider about: The right schedule for you to have regular tests and exams. Things you can do on your own to prevent diseases and keep yourself healthy. What should I know about diet, weight, and exercise? Eat a healthy diet  Eat a diet that includes plenty of vegetables, fruits, low-fat dairy products, and lean protein. Do not eat a lot of foods that are high in solid fats, added sugars, or sodium.  Maintain a healthy weight Body mass index (BMI) is a measurement that can be used to identify possible weight problems. It estimates body fat based on height and weight. Your health care provider can help determine your BMI and help you achieve or maintain ahealthy weight. Get regular exercise Get regular exercise. This is one of the most important things you can do for your health. Most adults should: Exercise for at least 150 minutes each week. The exercise should increase your heart rate and make you sweat (moderate-intensity exercise). Do strengthening exercises at least twice a week. This is in addition to the moderate-intensity exercise. Spend less time sitting. Even light physical activity can be beneficial. Watch cholesterol and blood lipids Have your blood tested for lipids and cholesterol at 61 years of age, then havethis test every 5 years. You may need to have your cholesterol levels checked more often if: Your lipid or cholesterol levels are high. You are older than 61 years of age. You are at high risk for heart disease. What should I know about cancer screening? Many types of cancers can be detected early and may often be prevented. Depending on your health history and family history, you may need to have cancer screening at various ages. This may include screening for: Colorectal cancer. Prostate cancer. Skin cancer. Lung  cancer. What should I know about heart disease, diabetes, and high blood pressure? Blood pressure and heart disease High blood pressure causes heart disease and increases the risk of stroke. This is more likely to develop in people who have high blood pressure readings, are of African descent, or are overweight. Talk with your health care provider about your target blood pressure readings. Have your blood pressure checked: Every 3-5 years if you are 18-39 years of age. Every year if you are 40 years old or older. If you are between the ages of 65 and 75 and are a current or former smoker, ask your health care provider if you should have a one-time screening for abdominal aortic aneurysm (AAA). Diabetes Have regular diabetes screenings. This checks your fasting blood sugar level. Have the screening done: Once every three years after age 45 if you are at a normal weight and have a low risk for diabetes. More often and at a younger age if you are overweight or have a high risk for diabetes. What should I know about preventing infection? Hepatitis B If you have a higher risk for hepatitis B, you should be screened for this virus. Talk with your health care provider to find out if you are at risk forhepatitis B infection. Hepatitis C Blood testing is recommended for: Everyone born from 1945 through 1965. Anyone with known risk factors for hepatitis C. Sexually transmitted infections (STIs) You should be screened each year for STIs, including gonorrhea and chlamydia, if: You are sexually active and are younger than 61 years of age. You are older than 61 years of age   and your health care provider tells you that you are at risk for this type of infection. Your sexual activity has changed since you were last screened, and you are at increased risk for chlamydia or gonorrhea. Ask your health care provider if you are at risk. Ask your health care provider about whether you are at high risk for HIV.  Your health care provider may recommend a prescription medicine to help prevent HIV infection. If you choose to take medicine to prevent HIV, you should first get tested for HIV. You should then be tested every 3 months for as long as you are taking the medicine. Follow these instructions at home: Lifestyle Do not use any products that contain nicotine or tobacco, such as cigarettes, e-cigarettes, and chewing tobacco. If you need help quitting, ask your health care provider. Do not use street drugs. Do not share needles. Ask your health care provider for help if you need support or information about quitting drugs. Alcohol use Do not drink alcohol if your health care provider tells you not to drink. If you drink alcohol: Limit how much you have to 0-2 drinks a day. Be aware of how much alcohol is in your drink. In the [place] one drink equals one 12 oz bottle of beer (355 mL), one 5 oz glass of wine (148 mL), or one 1 oz glass of hard liquor (44 mL). General instructions Schedule regular health, dental, and eye exams. Stay current with your vaccines. Tell your health care provider if: You often feel depressed. You have ever been abused or do not feel safe at home. Summary Adopting a healthy lifestyle and getting preventive care are important in promoting health and wellness. Follow your health care provider's instructions about healthy diet, exercising, and getting tested or screened for diseases. Follow your health care provider's instructions on monitoring your cholesterol and blood pressure. This information is not intended to replace advice given to you by your health care provider. Make sure you discuss any questions you have with your healthcare provider. Document Revised: 05/08/2018 Document Reviewed: 05/08/2018 Elsevier Patient Education  2022 [employer] Inc.  

## 2020-11-11 NOTE — Progress Notes (Signed)
[facility]  Subjective:  Patient ID: Cody Barnes, male    DOB: [birth year]  Age: 60 y.o. MRN: [id number]  CC: Annual Exam (Patient would like to discuss pain in left thigh/hip/Patient would like to discuss being tested for cancer)  This visit occurred during the SARS-CoV-2 public health emergency.  Safety protocols were in place, including screening questions prior to the visit, additional usage of staff PPE, and extensive cleaning of exam room while observing appropriate contact time as indicated for disinfecting solutions.    HPI Jafet Wissing presents for a CPX and f/up -   He complains of a 2-day history of discomfort in his left thigh.  He describes it as an achy sensation that occurs at rest.  He denies trauma or injury.  He has had prior L hip surgery.  He complains of a 55-monthhistory of new onset dyspnea on exertion after he climbs hills.  He walks his dog several miles a day and does not experience DOE when he is on a flat surface.  He has had a few dizzy spells but he denies diaphoresis, chest pain, palpitations, edema, or fatigue.  His mother had breast cancer and he wants to be screened for the BRCA gene.   Outpatient Medications Prior to Visit  Medication Sig Dispense Refill   atorvastatin (LIPITOR) 20 MG tablet TAKE 1 TABLET BY MOUTH EVERY DAY 90 tablet 3   irbesartan (AVAPRO) 150 MG tablet Take 1 tablet (150 mg total) by mouth daily. 90 tablet 1   No facility-administered medications prior to visit.    ROS Review of Systems  Constitutional:  Negative for appetite change, chills, diaphoresis, fatigue and fever.  HENT: Negative.    Eyes: Negative.   Respiratory:  Positive for shortness of breath. Negative for cough, chest tightness and wheezing.   Cardiovascular:  Negative for chest pain, palpitations and leg swelling.  Gastrointestinal:  Negative for abdominal pain, blood in stool, constipation, diarrhea, nausea and vomiting.  Endocrine: Negative.   Genitourinary:  Negative.  Negative for difficulty urinating.  Musculoskeletal:  Positive for arthralgias. Negative for back pain, myalgias and neck pain.  Skin: Negative.   Neurological: Negative.  Negative for dizziness, weakness, light-headedness, numbness and headaches.  Hematological:  Negative for adenopathy. Does not bruise/bleed easily.  Psychiatric/Behavioral: Negative.     Objective:  BP 118/82 (BP Location: Right Arm, Patient Position: Sitting, Cuff Size: Large)   Pulse 60   Temp 97.8 F (36.6 C) (Oral)   Resp 16   Ht 5' 5.25" (1.657 m)   Wt 187 lb (84.8 kg)   SpO2 96%   BMI 30.88 kg/m   BP Readings from Last 3 Encounters:  11/11/20 118/82  07/26/20 128/80  12/30/19 126/84    Wt Readings from Last 3 Encounters:  11/11/20 187 lb (84.8 kg)  07/26/20 187 lb 3.2 oz (84.9 kg)  07/14/20 181 lb (82.1 kg)    Physical Exam Vitals reviewed.  HENT:     Mouth/Throat:     Mouth: Mucous membranes are moist.  Eyes:     General: No scleral icterus.    Conjunctiva/sclera: Conjunctivae normal.  Cardiovascular:     Rate and Rhythm: Normal rate and regular rhythm.     Heart sounds: Murmur heard.  Systolic murmur is present with a grade of 2/6.  Diastolic murmur is present with a grade of 1/4.    No gallop.     Comments: EKG- NSR, 62 bpm LAE Septal infarct pattern is not new No new  Q waves Abdominal:     General: Abdomen is protuberant. Bowel sounds are normal. There is no distension.     Palpations: Abdomen is soft. There is no fluid wave, hepatomegaly, splenomegaly or mass.     Tenderness: There is no abdominal tenderness.     Hernia: A hernia is present. Hernia is present in the ventral area. There is no hernia in the left inguinal area or right inguinal area.  Genitourinary:    Pubic Area: No rash.      Penis: Normal and circumcised.      Testes: Normal.        Right: Mass not present.        Left: Mass not present.     Epididymis:     Right: Normal.     Left: Normal.      Prostate: Normal. Not enlarged, not tender and no nodules present.     Rectum: Normal. Guaiac result negative. No mass, tenderness, anal fissure, external hemorrhoid or internal hemorrhoid. Normal anal tone.  Musculoskeletal:     Cervical back: Neck supple.     Right upper leg: Normal.     Left upper leg: Normal. No swelling, edema, deformity, lacerations, tenderness or bony tenderness.     Right lower leg: No swelling. No edema.     Left lower leg: No swelling. No edema.  Lymphadenopathy:     Cervical: No cervical adenopathy.     Lower Body: No right inguinal adenopathy. No left inguinal adenopathy.  Neurological:     Mental Status: He is alert.    Lab Results  Component Value Date   WBC 7.2 11/11/2020   HGB 14.7 11/11/2020   HCT 43.2 11/11/2020   PLT 188.0 11/11/2020   GLUCOSE 94 11/11/2020   CHOL 171 11/11/2020   TRIG 93.0 11/11/2020   HDL 48.20 11/11/2020   LDLCALC 104 (H) 11/11/2020   ALT 30 11/11/2020   AST 19 11/11/2020   NA 139 11/11/2020   K 4.7 11/11/2020   CL 104 11/11/2020   CREATININE 1.19 11/11/2020   BUN 20 11/11/2020   CO2 28 11/11/2020   TSH 1.68 11/11/2020   PSA 1.71 11/11/2020   INR 1.0 12/10/2013   HGBA1C 5.3 12/28/2015    CT RENAL STONE STUDY  Result Date: 10/18/2014 CLINICAL DATA:  Left upper flank pain. EXAM: CT ABDOMEN AND PELVIS WITHOUT CONTRAST TECHNIQUE: Multidetector CT imaging of the abdomen and pelvis was performed following the standard protocol without IV contrast. COMPARISON:  None. FINDINGS: Lung bases are clear. Postoperative changes in the heart which are incompletely evaluated on this examination. Negative for free air. Unenhanced CT was performed per clinician order. Lack of IV contrast limits sensitivity and specificity, especially for evaluation of abdominal/pelvic solid viscera. Normal appearance of the gallbladder, liver and spleen. Normal appearance of the pancreas and adrenal glands. No evidence for kidney stones or hydronephrosis.  No acute abnormality involving the kidneys or urinary bladder. Possible low-density cyst in the left kidney upper pole. Normal appearance of the seminal vesicles and prostate. Moderate amount of stool in the colon. Normal appearance of the small bowel and appendix. There appears to be bilateral inguinal hernias containing fat. Difficult to exclude previous surgery in the inguinal areas, particularly on the left side. There is no significant free fluid or lymphadenopathy within the abdomen or pelvis. Internal fixation in the left femoral head and proximal left femur. No acute bone abnormalities. IMPRESSION: No acute abnormalities in the abdomen or pelvis. Specifically, no evidence  for kidney stones or hydronephrosis. Electronically Signed   By: Markus Daft M.D.   On: 10/18/2014 08:53   DG FEMUR MIN 2 VIEWS LEFT  Result Date: 11/11/2020 CLINICAL DATA:  Pain EXAM: LEFT FEMUR 2 VIEWS COMPARISON:  None. FINDINGS: Frontal and lateral views were obtained. There is screw and plate fixation in the proximal femur with healed fracture of the proximal femoral diaphysis. No acute fracture or dislocation. No abnormal periosteal reaction. There is narrowing of the right hip joint. Screw tips in proximal femoral head. No knee joint effusion. Multiple small radiopaque foreign bodies noted lateral to the proximal femur of uncertain etiology. IMPRESSION: Postoperative change proximal femur. Nearby radiopaque foreign bodies in the soft tissues of uncertain etiology. No acute fracture or dislocation. Moderate narrowing left hip joint. Electronically Signed   By: Lowella Grip III M.D.   On: 11/11/2020 09:08     Assessment & Plan:   Mar was seen today for annual exam.  Diagnoses and all orders for this visit:  Essential hypertension- His blood pressure is somewhat overcontrolled and he is symptomatic with dizzy spells.  I recommended that he stop taking the ARB. -     CBC with Differential/Platelet; Future -     Basic  metabolic panel; Future -     TSH; Future -     Urinalysis, Routine w reflex microscopic; Future -     EKG 12-Lead -     Urinalysis, Routine w reflex microscopic -     TSH -     Basic metabolic panel -     CBC with Differential/Platelet  Nonrheumatic aortic valve insufficiency -     Ambulatory referral to Cardiology  Nonrheumatic mitral valve regurgitation -     Ambulatory referral to Cardiology  Hyperlipidemia with target LDL less than 130- LDL goal achieved. Doing well on the statin  -     TSH; Future -     Hepatic function panel; Future -     CK; Future -     CK -     Hepatic function panel -     TSH  Routine general medical examination at a health care facility- Exam completed, labs reviewed, vaccines reviewed and updated, cancer screenings addressed, patient education material was given. -     Lipid panel; Future -     PSA; Future -     PSA -     Lipid panel   Acute pain of left thigh- Based on his symptoms, exam, and plain films this is benign.  He wants to see a specialist about this. -     DG FEMUR MIN 2 VIEWS LEFT; Future -     Sedimentation rate; Future -     CK; Future -     CK -     Sedimentation rate -     Ambulatory referral to Sports Medicine  Abnormal electrocardiogram (ECG) (EKG)- He has a 48-monthhistory of DOE.  His EKG is unchanged.  I have asked him to follow-up with cardiology. -     Ambulatory referral to Cardiology  Family history of breast cancer in male -     Ambulatory referral to Genetics  I have discontinued Kaden Kim's irbesartan. I am also having him maintain his atorvastatin.  No orders of the defined types were placed in this encounter.    Follow-up: Return in about 3 months (around 02/11/2021).  TScarlette Calico MD

## 2020-11-12 ENCOUNTER — Telehealth: Payer: Self-pay | Admitting: Licensed Clinical Social Worker

## 2020-11-12 DIAGNOSIS — Z803 Family history of malignant neoplasm of breast: Secondary | ICD-10-CM | POA: Insufficient documentation

## 2020-11-12 NOTE — Telephone Encounter (Signed)
Pt called back to sch appt per 6/16 referral. Pt aware of date and time.  

## 2020-11-13 ENCOUNTER — Other Ambulatory Visit: Payer: Self-pay | Admitting: Interventional Cardiology

## 2020-11-13 DIAGNOSIS — E785 Hyperlipidemia, unspecified: Secondary | ICD-10-CM

## 2020-11-18 ENCOUNTER — Other Ambulatory Visit: Payer: Self-pay

## 2020-11-18 ENCOUNTER — Ambulatory Visit (INDEPENDENT_AMBULATORY_CARE_PROVIDER_SITE_OTHER): Payer: 59 | Admitting: Family Medicine

## 2020-11-18 DIAGNOSIS — M1612 Unilateral primary osteoarthritis, left hip: Secondary | ICD-10-CM | POA: Diagnosis not present

## 2020-11-18 NOTE — Progress Notes (Signed)
    SUBJECTIVE:   CHIEF COMPLAINT / HPI:   Cody Barnes is a 60 yr old male who presents with a acute thigh pain  Acute thigh pain  Pt reports acute left sided anterior hip pain, radiating down to the left knee 2 weekends ago following active weekend involving lots of walking while playing golf. Following the painful episode, he took ibuprofen, applied ice to the area, and rested which improved his symptoms. He is now asymptomatic.  He reports he has a traumatic injury to the left hip 25 years ago following a ground level fall, which resulted in a significant fracture requiring ORIF. Since then he reports reduced ROM of the left hip joint and occasional thigh pain when he has over exerted himself. Denies any other injuries to the area. Saw his PCP last week for a physical who ordered hip x-rays which showed OA, no fracture of dislocation of the joint. He would like to ensure there is nothing worrying related to his hip joint. He leads an active lifestyle and would like to maintain this as much as possible.   PERTINENT  PMH / PSH: MR, HTN, allergic rhinitis   OBJECTIVE:   BP 124/76   Ht 6' (1.829 m)   Wt 185 lb (83.9 kg)   BMI 25.09 kg/m    Hip:  - Inspection: No gross deformity, no swelling, erythema, or ecchymosis - Palpation: No tenderness to palpation throughout the musculature of the thigh.  No tenderness over the greater trochanteric area, or within the gluteal or piriformis muscles. - ROM: Limited range of motion on flexion, extension, abduction, internal rotation left>right, similar to previous. Pain with extremes of internal rotation of left hip, which he states are previous to his symptoms felt last week. - Strength: Normal strength, weakness resisted hip abductors left>right (positive two-fingers bilaterally) - Neuro/vasc: NV intact distally - Special Tests: Negative Trendelenberg.     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  Given reproduction of his pain with extremes of internal rotation, suspect this was the underlying source of his discomfort-most likely due to an abnormally vigorous day on the golf course.  Pt also has significant weakness of left hip abductors on exam, which is likely exacerbating his symptoms.   Previous x-rays also demonstrate evidence of microcalcifications in the lateral muscles of thigh, which appear chronic and inconsistent with myositis ossificans.  These calcifications are likely unrelated to his source of pain. It is likely related to his operation many years ago. Provided reassurance.   Recommended: -Pelvic stabilizing exercises to help support the hip and reduce the symptomatic burden of his mild osteoarthritis. -Staying as active as possible -if pain worsens return to sports medicine centre      Towanda Octave, MD [facility]   I saw and evaluated patient with resident.  Agree with assessment and plan as outlined above.  Note has been edited as needed for accuracy. Reather Laurence, DO Sports Medicine Fellow  Addendum:  I was the preceptor for this visit and available for immediate consultation.  Norton Blizzard MD Marrianne Mood

## 2020-11-18 NOTE — Patient Instructions (Signed)
Thank you for coming to see me today. It was a pleasure. Today we discussed your left hip pain. It is most likely due to a flare of left hip osteoarthritis. I recommend:  -Hip abductor strengthening exercises -Staying as active as possible -if pain worsens return to sports medicine centre    If you have any questions or concerns, please do not hesitate to call the office. Best wishes,   Dr Allena Katz

## 2020-11-18 NOTE — Assessment & Plan Note (Addendum)
Most likely flare of left hip OA following overuse. Pt also has significant weakness of left hip abductors on exam  likely contributing to pelvic instability. Reviewed recent x-ray with patient and explained that there is no fracture or dislocations. It does however show mild OA of the hip joint. There is evidence of microcalcifications in the lateral muscles of hip joint which are likely unrelated to his source of pain. Low suspicion for myositis, sarcoma etc. It is likely related to his operation many years ago. Provided reassurance. Recommended: -Hip abductor strengthening exercises -Staying as active as possible -if pain worsens return to sports medicine centre

## 2020-11-22 ENCOUNTER — Inpatient Hospital Stay: Payer: 59 | Attending: Internal Medicine | Admitting: Licensed Clinical Social Worker

## 2020-11-22 ENCOUNTER — Encounter: Payer: Self-pay | Admitting: Licensed Clinical Social Worker

## 2020-11-22 ENCOUNTER — Other Ambulatory Visit: Payer: Self-pay

## 2020-11-22 ENCOUNTER — Inpatient Hospital Stay: Payer: 59

## 2020-11-22 DIAGNOSIS — Z803 Family history of malignant neoplasm of breast: Secondary | ICD-10-CM | POA: Diagnosis not present

## 2020-11-22 LAB — GENETIC SCREENING ORDER

## 2020-11-22 NOTE — Progress Notes (Signed)
REFERRING PROVIDER: Janith Lima, MD [address] [place],  [place] [address]  PRIMARY PROVIDER:  Janith Lima, MD  PRIMARY REASON FOR VISIT:  1. Family history of breast cancer      HISTORY OF PRESENT ILLNESS:   Cody Barnes, a 60 y.o. male, was seen for a Opp cancer genetics consultation at the request of Dr. Ronnald Barnes due to a family history of breast cancer.  Cody Barnes presents to clinic today to discuss the possibility of a hereditary predisposition to cancer, genetic testing, and to further clarify his future cancer risks, as well as potential cancer risks for family members.   Cody Barnes is a 60 y.o. male with no personal history of cancer.  He has had colonoscopy without polyps and is followed by urology for prostate cancer screening.  CANCER HISTORY:  Oncology History   No history exists.    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    Past Surgical History:  Procedure Laterality Date   HIP FRACTURE SURGERY     left   INGUINAL HERNIA REPAIR     as infant   LEFT AND RIGHT HEART CATHETERIZATION WITH CORONARY ANGIOGRAM N/A 12/12/2013   Procedure: LEFT AND RIGHT HEART CATHETERIZATION WITH CORONARY ANGIOGRAM;  Surgeon: Cody Booze, MD;  Location: [facility] CATH LAB;  Service: Cardiovascular;  Laterality: N/A;   TEE WITHOUT CARDIOVERSION N/A 11/25/2013   Procedure: TRANSESOPHAGEAL ECHOCARDIOGRAM (TEE);  Surgeon: Cody Margarita, MD;  Location: [facility] ENDOSCOPY;  Service: Cardiovascular;  Laterality: N/A;    Social History   Socioeconomic History   Marital status: Married    Spouse name: Not on file   Number of children: Not on file   Years of education: Not on file   Highest education level: Not on file   Occupational History   Not on file  Tobacco Use   Smoking status: Never   Smokeless tobacco: Never  Substance and Sexual Activity   Alcohol use: Yes    Alcohol/week: 5.0 standard drinks    Types: 5 Cans of beer per week   Drug use: No   Sexual activity: Yes  Other Topics Concern   Not on file  Social History Narrative   Not on file   Social Determinants of Health   Financial Resource Strain: Not on file  Food Insecurity: Not on file  Transportation Needs: Not on file  Physical Activity: Not on file  Stress: Not on file  Social Connections: Not on file     FAMILY HISTORY:  We obtained a detailed, 4-generation family history.  Significant diagnoses are listed below: Family History  Problem Relation Age of Onset   Breast cancer Mother 38   Liver cancer Mother 44   Drug abuse Neg Hx    Early death Neg Hx    Heart disease Neg Hx    Hyperlipidemia Neg Hx    Hypertension Neg Hx    Kidney disease Neg Hx    Stroke Neg Hx    Arthritis Neg Hx    Cody Barnes has a son and a daughter, no cancers. His daughter-in-law was recently diagnosed with triple negative stage I breast cancer and has a BRCA mutation.  Cody Barnes has 2 brothers, no cancers.   Cody Barnes mother was diagnosed with breast cancer at 71, liver cancer at 11 and died at 57. Patient had 1 maternal uncle, no cancer. No cancers for his maternal first cousins, he does note that his mother's maternal first cousins (there were approximately 15-20) had lots of cancers, unknown types. Maternal grandmother died at 73, grandfather died at 40.   Cody Barnes father is living at 36, no cancer history. Patient had 4 paternal uncles, 1 paternal aunt, no cancers. No known cancers in paternal cousins or for paternal grandparents.   Cody Barnes is unaware of previous family history of genetic testing for hereditary cancer risks. Patient's maternal ancestors are of [place] and Ashkenazi Jewish descent, and paternal ancestors are of  [place] and Ashkenazi Jewish descent. There is no known consanguinity.    GENETIC COUNSELING ASSESSMENT: [name] is a 60 y.o. male with a family history of breast cancer which is somewhat suggestive of a hereditary cancer syndrome and predisposition to cancer. We, therefore, discussed and recommended the following at today's visit.   DISCUSSION: We discussed that approximately 5-10% of breast cancer is hereditary. Most cases of hereditary breast cancer are associated with BRCA1/BRCA2 genes, although there are other genes associated with hereditary cancer as well. Cancers and risks are gene specific.  We discussed that testing is beneficial for several reasons including  knowing about cancer risks, identifying potential screening and risk-reduction options that may be appropriate, and to understand if other family members could be at risk for cancer and allow them to undergo genetic testing.   We reviewed the characteristics, features and inheritance patterns of hereditary cancer syndromes. We also discussed genetic testing, including the appropriate family members to test, the process of testing, insurance coverage and turn-around-time for results. We discussed the implications of a negative, positive and/or variant of uncertain significant result. We recommended Cody Barnes pursue genetic testing for the [employer] CancerNext-Expanded+RNA gene panel.   The CancerNext-Expanded + RNAinsight gene panel offered by [employer] and includes sequencing and rearrangement analysis for the following 77 genes: IP, ALK, APC*, ATM*, AXIN2, BAP1, BARD1, BLM, BMPR1A, BRCA1*, BRCA2*, BRIP1*, CDC73, CDH1*,CDK4, CDKN1B, CDKN2A, CHEK2*, CTNNA1, DICER1, FANCC, FH, FLCN, GALNT12, KIF1B, LZTR1, MAX, MEN1, MET, MLH1*, MSH2*, MSH3, MSH6*, MUTYH*, NBN, NF1*, NF2, NTHL1, PALB2*, PHOX2B, PMS2*, POT1, PRKAR1A, PTCH1, PTEN*, RAD51C*, RAD51D*,RB1, RECQL, RET, SDHA, SDHAF2, SDHB, SDHC, SDHD, SMAD4, SMARCA4, SMARCB1, SMARCE1, STK11,  SUFU, TMEM127, TP53*,TSC1, TSC2, VHL and XRCC2 (sequencing and deletion/duplication); EGFR, EGLN1, HOXB13, KIT, MITF, PDGFRA, POLD1 and POLE (sequencing only); EPCAM and GREM1 (deletion/duplication only).  Based on Cody Barnes family history of cancer, he meets medical criteria for genetic testing. Despite that he meets criteria, he may still have an out of pocket cost. We discussed that if his out of pocket cost for testing is over $100, the laboratory will call and confirm whether he wants to proceed with testing.  If the out of pocket cost of testing is less than $100 he will be billed by the genetic testing laboratory.   We discussed that some people do not want to undergo genetic testing due to fear of genetic discrimination.  A federal law called the Genetic Information Non-Discrimination Act (GINA) of 2008 helps protect individuals against genetic discrimination based on their genetic test results.  It impacts both health insurance and employment.  For health insurance, it protects against increased premiums, being kicked off insurance or being forced to take a test in order to  be insured.  For employment it protects against hiring, firing and promoting decisions based on genetic test results.  Health status due to a cancer diagnosis is not protected under GINA.  This law does not protect life insurance, disability insurance, or other types of insurance.   PLAN: After considering the risks, benefits, and limitations, Cody Barnes provided informed consent to pursue genetic testing and the blood sample was sent to [facility] for analysis of the CancerNext-Expanded+RNA panel. Results should be available within approximately 2-3 weeks' time, at which point they will be disclosed by telephone to Cody Barnes, as will any additional recommendations warranted by these results. Cody Barnes will receive a summary of his genetic counseling visit and a copy of his results once available. This information  will also be available in Epic.   Cody Barnes questions were answered to his satisfaction today. Our contact information was provided should additional questions or concerns arise. Thank you for the referral and allowing [place] to share in the care of your patient.   Faith Rogue, MS, [facility] Genetic Counselor [email or web address] Phone: [phone]  The patient was seen for a total of 25 minutes in face-to-face genetic counseling.  Patient was seen alone. Drs. Magrinat, Lindi Adie, and/or Burr Medico were available for discussion regarding this case.   _______________________________________________________________________ For Office Staff:  Number of people involved in session: 1 Was an Intern/ student involved with case: no

## 2020-11-24 ENCOUNTER — Encounter: Payer: Self-pay | Admitting: Gastroenterology

## 2020-11-29 NOTE — Progress Notes (Signed)
Cardiology Office Note   Date:  12/01/2020   ID:  Cody Barnes, DOB May 20, 1961, MRN [id number]  PCP:  Etta Grandchild, MD    No chief complaint on file.  S/p AVR  Wt Readings from Last 3 Encounters:  12/01/20 186 lb 12.8 oz (84.7 kg)  11/18/20 185 lb (83.9 kg)  11/11/20 187 lb (84.8 kg)       History of Present Illness: Cody Barnes is a 60 y.o. male    who has had mitral valve prolapse for many years. He was followed with serial echoes. He was noted to have increased LV size at the time of his last echocardiogram in 2015 with some symptoms of dyspnea on exertion despite preserved LVEF. He was referred for surgery. He chose to go to [facility] clinic for his operation. He underwent mitral valve repair and aortic valve replacement due to aortic insufficiency as well. He chose to have a bioprosthetic aortic valve. Cardiac cath prior to the valve surgery was negative for significant coronary artery disease.   He was started on atorvastatin for elevated cholesterol. Of note, he had a mildly increased ALT before starting the medicine.  Saw oncology regarding cancer history.  Awaiting results from screening.   Denies : Chest pain. Dizziness. Leg edema. Nitroglycerin use. Orthopnea. Palpitations. Paroxysmal nocturnal dyspnea. Shortness of breath. Syncope.    Father alive at 51 with pacemaker.   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    Past Surgical History:  Procedure Laterality Date   HIP FRACTURE SURGERY     left   INGUINAL HERNIA REPAIR     as infant   LEFT AND RIGHT HEART CATHETERIZATION WITH CORONARY ANGIOGRAM N/A 12/12/2013   Procedure: LEFT AND RIGHT HEART CATHETERIZATION WITH CORONARY ANGIOGRAM;  Surgeon:  Corky Crafts, MD;  Location: [facility] CATH LAB;  Service: Cardiovascular;  Laterality: N/A;   TEE WITHOUT CARDIOVERSION N/A 11/25/2013   Procedure: TRANSESOPHAGEAL ECHOCARDIOGRAM (TEE);  Surgeon: Quintella Reichert, MD;  Location[facility] ENDOSCOPY;  Service: Cardiovascular;  Laterality: N/A;     Current Outpatient Medications  Medication Sig Dispense Refill   atorvastatin (LIPITOR) 20 MG tablet Take 1 tablet (20 mg total) by mouth daily. Please schedule yearly appointment for future refills. Thank you 90 tablet 0   No current facility-administered medications for this visit.    Allergies:   Patient has no known allergies.    Social History:  The patient  reports that he has never smoked. He has never used smokeless tobacco. He reports current alcohol use of about 5.0 standard drinks of alcohol per week. He reports that he does not use drugs.   Family History:  The patient's family history includes Breast cancer (age of onset: 25) in his mother; Liver cancer (age of onset: 23) in his mother.    ROS:  Please see the history of present illness.   Otherwise, review of systems are positive for mild weight gain.   All other systems are reviewed and negative.    PHYSICAL EXAM: VS:  BP 102/60   Pulse 74   Ht 6' (1.829 m)   Wt 186 lb 12.8 oz (84.7 kg)   SpO2 95%  BMI 25.33 kg/m  , BMI Body mass index is 25.33 kg/m. GEN: Well nourished, well developed, in no acute distress HEENT: normal Neck: no JVD, carotid bruits, or masses Cardiac: RRR; 2/6 systolic murmur, no rubs, or gallops,no edema  Respiratory:  clear to auscultation bilaterally, normal work of breathing GI: soft, nontender, nondistended, + BS MS: no deformity or atrophy Skin: warm and dry, no rash Neuro:  Strength and sensation are intact Psych: euthymic mood, full affect   EKG:   The ekg ordered in June 2022 demonstrates normal sinus rhythm, ST changes in the pattern of early repolarization, similar to prior   Recent  Labs: 11/11/2020: ALT 30; BUN 20; Creatinine, Ser 1.19; Hemoglobin 14.7; Platelets 188.0; Potassium 4.7; Sodium 139; TSH 1.68   Lipid Panel    Component Value Date/Time   CHOL 171 11/11/2020 0858   CHOL 114 10/04/2016 0757   TRIG 93.0 11/11/2020 0858   HDL 48.20 11/11/2020 0858   HDL 41 10/04/2016 0757   CHOLHDL 4 11/11/2020 0858   VLDL 18.6 11/11/2020 0858   LDLCALC 104 (H) 11/11/2020 0858   LDLCALC 61 10/04/2016 0757     Other studies Reviewed: Additional studies/ records that were reviewed today with results demonstrating: labs reviewed.   ASSESSMENT AND PLAN:  MV repair/ AV replacement: SBE prophylaxis.  No signs of CHF.  He continues to walk and hike regularly.  No symptoms of angina.  He will let us know if there is any change in his exercise tolerance or if he develops any chest discomfort. Hyperlipidemia: Continue atorvastatin.  Avoid processed carbohydrates.  Whole food, plant-based diet.  High-fiber intake.  He does think perhaps his beer intake has caused him to gain a little bit of weight. Elevated LFTs: Noted in the past.  Now normal.     Current medicines are reviewed at length with the patient today.  The patient concerns regarding his medicines were addressed.  The following changes have been made:  No change  Labs/ tests ordered today include:  No orders of the defined types were placed in this encounter.   Recommend 150 minutes/week of aerobic exercise Low fat, low carb, high fiber diet recommended  Disposition:   FU in 1 year   Signed, Lance Muss, MD  12/01/2020 8:20 AM    [facility] [address] [place], [place], [place]  [address] Phone: [phone]; Fax: [phone]

## 2020-12-01 ENCOUNTER — Encounter: Payer: Self-pay | Admitting: Interventional Cardiology

## 2020-12-01 ENCOUNTER — Other Ambulatory Visit: Payer: Self-pay

## 2020-12-01 ENCOUNTER — Ambulatory Visit: Payer: 59 | Admitting: Interventional Cardiology

## 2020-12-01 VITALS — BP 102/60 | HR 74 | Ht 72.0 in | Wt 186.8 lb

## 2020-12-01 DIAGNOSIS — Z953 Presence of xenogenic heart valve: Secondary | ICD-10-CM | POA: Diagnosis not present

## 2020-12-01 DIAGNOSIS — E785 Hyperlipidemia, unspecified: Secondary | ICD-10-CM

## 2020-12-01 DIAGNOSIS — I340 Nonrheumatic mitral (valve) insufficiency: Secondary | ICD-10-CM

## 2020-12-01 DIAGNOSIS — R7989 Other specified abnormal findings of blood chemistry: Secondary | ICD-10-CM

## 2020-12-01 DIAGNOSIS — I351 Nonrheumatic aortic (valve) insufficiency: Secondary | ICD-10-CM

## 2020-12-01 NOTE — Patient Instructions (Signed)
Medication Instructions:  Your physician recommends that you continue on your current medications as directed. Please refer to the Current Medication list given to you today.  *If you need a refill on your cardiac medications before your next appointment, please call your pharmacy*   Lab Work: none If you have labs (blood work) drawn today and your tests are completely normal, you will receive your results only by: MyChart Message (if you have MyChart) OR A paper copy in the mail If you have any lab test that is abnormal or we need to change your treatment, we will call you to review the results.   Testing/Procedures: none   Follow-Up: At [facility], you and your health needs are our priority.  As part of our continuing mission to provide you with exceptional heart care, we have created designated Provider Care Teams.  These Care Teams include your primary Cardiologist (physician) and Advanced Practice Providers (APPs -  Physician Assistants and Nurse Practitioners) who all work together to provide you with the care you need, when you need it.  We recommend signing up for the patient portal called "MyChart".  Sign up information is provided on this After Visit Summary.  MyChart is used to connect with patients for Virtual Visits (Telemedicine).  Patients are able to view lab/test results, encounter notes, upcoming appointments, etc.  Non-urgent messages can be sent to your provider as well.   To learn more about what you can do with MyChart, go to [email or web address].    Your next appointment:   12 month(s)  The format for your next appointment:   In Person  Provider:   You may see Jayadeep Varanasi, MD or one of the following Advanced Practice Providers on your designated Care Team:   Dayna Dunn, PA-C Michele Lenze, PA-C   Other Instructions High-Fiber Eating Plan Fiber, also called dietary fiber, is a type of carbohydrate. It is found foods such as fruits, vegetables,  whole grains, and beans. A high-fiber diet can have many health benefits. Your health care provider may recommend a high-fiber diet to help: Prevent constipation. Fiber can make your bowel movements more regular. Lower your cholesterol. Relieve the following conditions: Inflammation of veins in the anus (hemorrhoids). Inflammation of specific areas of the digestive tract (uncomplicated diverticulosis). A problem of the large intestine, also called the colon, that sometimes causes pain and diarrhea (irritable bowel syndrome, or IBS). Prevent overeating as part of a weight-loss plan. Prevent heart disease, type 2 diabetes, and certain cancers. What are tips for following this plan? Reading food labels  Check the nutrition facts label on food products for the amount of dietary fiber. Choose foods that have 5 grams of fiber or more per serving. The goals for recommended daily fiber intake include: Men (age 50 or younger): 34-38 g. Men (over age 50): 28-34 g. Women (age 50 or younger): 25-28 g. Women (over age 50): 22-25 g. Your daily fiber goal is _____________ g. Shopping Choose whole fruits and vegetables instead of processed forms, such as apple juice or applesauce. Choose a wide variety of high-fiber foods such as avocados, lentils, oats, and kidney beans. Read the nutrition facts label of the foods you choose. Be aware of foods with added fiber. These foods often have high sugar and sodium amounts per serving. Cooking Use whole-grain flour for baking and cooking. Cook with brown rice instead of white rice. Meal planning Start the day with a breakfast that is high in fiber, such as a cereal that   contains 5 g of fiber or more per serving. Eat breads and cereals that are made with whole-grain flour instead of refined flour or white flour. Eat brown rice, bulgur wheat, or millet instead of white rice. Use beans in place of meat in soups, salads, and pasta dishes. Be sure that half of the  grains you eat each day are whole grains. General information You can get the recommended daily intake of dietary fiber by: Eating a variety of fruits, vegetables, grains, nuts, and beans. Taking a fiber supplement if you are not able to take in enough fiber in your diet. It is better to get fiber through food than from a supplement. Gradually increase how much fiber you consume. If you increase your intake of dietary fiber too quickly, you may have bloating, cramping, or gas. Drink plenty of water to help you digest fiber. Choose high-fiber snacks, such as berries, raw vegetables, nuts, and popcorn. What foods should I eat? Fruits Berries. Pears. Apples. Oranges. Avocado. Prunes and raisins. Dried figs. Vegetables Sweet potatoes. Spinach. Kale. Artichokes. Cabbage. Broccoli. Cauliflower.Green peas. Carrots. Squash. Grains Whole-grain breads. Multigrain cereal. Oats and oatmeal. Brown rice. Barley.Bulgur wheat. Millet. Quinoa. Bran muffins. Popcorn. Rye wafer crackers. Meats and other proteins Navy beans, kidney beans, and pinto beans. Soybeans. Split peas. Lentils. Nutsand seeds. Dairy Fiber-fortified yogurt. Beverages Fiber-fortified soy milk. Fiber-fortified orange juice. Other foods Fiber bars. The items listed above may not be a complete list of recommended foods and beverages. Contact a dietitian for more information. What foods should I avoid? Fruits Fruit juice. Cooked, strained fruit. Vegetables Fried potatoes. Canned vegetables. Well-cooked vegetables. Grains White bread. Pasta made with refined flour. White rice. Meats and other proteins Fatty cuts of meat. Fried chicken or fried fish. Dairy Milk. Yogurt. Cream cheese. Sour cream. Fats and oils Butters. Beverages Soft drinks. Other foods Cakes and pastries. The items listed above may not be a complete list of foods and beverages to avoid. Talk with your dietitian about what choices are best for you. Summary Fiber  is a type of carbohydrate. It is found in foods such as fruits, vegetables, whole grains, and beans. A high-fiber diet has many benefits. It can help to prevent constipation, lower blood cholesterol, aid weight loss, and reduce your risk of heart disease, diabetes, and certain cancers. Increase your intake of fiber gradually. Increasing fiber too quickly may cause cramping, bloating, and gas. Drink plenty of water while you increase the amount of fiber you consume. The best sources of fiber include whole fruits and vegetables, whole grains, nuts, seeds, and beans. This information is not intended to replace advice given to you by your health care provider. Make sure you discuss any questions you have with your healthcare provider. Document Revised: 09/18/2019 Document Reviewed: 09/18/2019 Elsevier Patient Education  2022 Elsevier Inc.   

## 2020-12-04 LAB — BASIC METABOLIC PANEL
BUN: 18 (ref 4–21)
CO2: 23 — AB (ref 13–22)
Chloride: 106 (ref 99–108)
Creatinine: 1.1 (ref 0.6–1.3)
Glucose: 98
Potassium: 5 (ref 3.4–5.3)
Sodium: 138 (ref 137–147)

## 2020-12-04 LAB — CBC AND DIFFERENTIAL
HCT: 48 (ref 41–53)
Hemoglobin: 15.8 (ref 13.5–17.5)
Platelets: 203 (ref 150–399)
WBC: 9.8

## 2020-12-04 LAB — COMPREHENSIVE METABOLIC PANEL: Calcium: 9.7 (ref 8.7–10.7)

## 2020-12-06 ENCOUNTER — Telehealth: Payer: Self-pay | Admitting: Internal Medicine

## 2020-12-06 NOTE — Telephone Encounter (Signed)
   Patients wife called and said that they will not be home until tomorrow evening. She was wondering if the patient could be seen on Wednesday

## 2020-12-06 NOTE — Telephone Encounter (Signed)
[place], He was diagnosed with Bells Palsy  Patient needing follow up visit. When should patient be scheduled?

## 2020-12-07 NOTE — Telephone Encounter (Signed)
Pt has been scheduled for tomorrow at 9.20am

## 2020-12-08 ENCOUNTER — Ambulatory Visit: Payer: 59 | Admitting: Internal Medicine

## 2020-12-08 ENCOUNTER — Encounter: Payer: Self-pay | Admitting: Internal Medicine

## 2020-12-08 ENCOUNTER — Other Ambulatory Visit: Payer: Self-pay

## 2020-12-08 VITALS — BP 114/68 | HR 62 | Temp 98.1°F | Resp 16 | Ht 72.0 in | Wt 188.0 lb

## 2020-12-08 DIAGNOSIS — G510 Bell's palsy: Secondary | ICD-10-CM | POA: Insufficient documentation

## 2020-12-08 DIAGNOSIS — K529 Noninfective gastroenteritis and colitis, unspecified: Secondary | ICD-10-CM | POA: Diagnosis not present

## 2020-12-08 LAB — SARS-COV-2 IGG: SARS-COV-2 IgG: 8.14

## 2020-12-08 NOTE — Patient Instructions (Signed)
Bell's Palsy, Adult  Bell's palsy is a short-term inability to move muscles in a part of the face. The inability to move, also called paralysis, results from inflammation or compression of the seventh cranial nerve. This nerve travels along the skull and under the ear to the side of the face. This nerve is responsible for facialmovements that include blinking, closing the eyes, smiling, and frowning. What are the causes? The exact cause of this condition is not known. It may be caused by an infection from a virus, such as the chickenpox (herpes zoster), Epstein-Barr, or mumps virus. What increases the risk? You are more likely to develop this condition if: You are pregnant. You have diabetes. You have had a recent infection in your nose, throat, or airways. You have a weakened body defense system (immune system). You have had a facial injury, such as a fracture. You have a family history of Bell's palsy. What are the signs or symptoms? Symptoms of this condition include: Weakness on one side of the face. Drooping eyelid and corner of the mouth. Excessive tearing in one eye. Difficulty closing the eyelid. Dry eye. Drooling. Dry mouth. Changes in taste. Change in facial appearance. Pain behind one ear. Ringing in one or both ears. Sensitivity to sound in one ear. Facial twitching. Headache. Impaired speech. Dizziness. Difficulty eating or drinking. Most of the time, only one side of the face is affected. In rare cases, Bell'spalsy may affect the whole face. How is this diagnosed? This condition is diagnosed based on: Your symptoms. Your medical history. A physical exam. You may also have to see health care providers who specialize in disorders of the nerves (neurologist) or diseases and conditions of the eye (ophthalmologist). You may have tests, such as: A test to check for nerve damage (electromyogram). Imaging studies, such as a CT scan or an MRI. Blood tests. How is this  treated? This condition affects every person differently. Sometimes symptoms go away without treatment within a couple weeks. If treatment is needed, it varies from person to person. The goal of treatment is to reduce inflammation and protect the eye from damage. Treatment for Bell's palsy may include: Medicines, such as: Steroids to reduce swelling and inflammation. Antiviral medicines. Pain relievers, including aspirin, acetaminophen, or ibuprofen. Eye drops or ointment to keep your eye moist. Eye protection, if you cannot close your eye. Exercises or massage to regain muscle strength and function (physical therapy). Follow these instructions at home:  Take over-the-counter and prescription medicines only as told by your health care provider. If your eye is affected: Keep your eye moist with eye drops or ointment as told by your health care provider. Follow instructions for eye care and protection as told by your health care provider. Do any physical therapy exercises as told by your health care provider. Keep all follow-up visits. This is important. Contact a health care provider if: You have a fever or chills. Your symptoms do not get better within 2-3 weeks, or your symptoms get worse. Your eye is red, irritated, or painful. You have new symptoms. Get help right away if: You have weakness or numbness in a part of your body other than your face. You have trouble swallowing. You develop neck pain or stiffness. You develop dizziness or shortness of breath. Summary Bell's palsy is a short-term inability to move muscles in a part of the face. The inability to move results from inflammation or compression of the facial nerve. This condition affects every person differently. Sometimes   symptoms go away without treatment within a couple weeks. If treatment is needed, it varies from person to person. The goal of treatment is to reduce inflammation and protect the eye from damage. Contact  your health care provider if your symptoms do not get better within 2-3 weeks, or your symptoms get worse. This information is not intended to replace advice given to you by your health care provider. Make sure you discuss any questions you have with your healthcare provider. Document Revised: 02/12/2020 Document Reviewed: 02/12/2020 Elsevier Patient Education  2022 Elsevier Inc.  

## 2020-12-08 NOTE — Progress Notes (Signed)
blls  Subjective:  Patient ID: Cody Barnes, male    DOB: [birth year]  Age: 60 y.o. MRN: [id number]  CC: Follow-up  This visit occurred during the SARS-CoV-2 public health emergency.  Safety protocols were in place, including screening questions prior to the visit, additional usage of staff PPE, and extensive cleaning of exam room while observing appropriate contact time as indicated for disinfecting solutions.    HPI [name] presents for f/up -  He was in [place] 5 days ago when he developed a gastroenteritis.  He also developed right facial weakness.  He was seen at a hospital and a work-up for stroke was negative.  He was diagnosed with Bell's palsy and is taking steroids and valacyclovir.  He has noticed modest improvement with the symptoms on the right side of his face.  He is also had a metallic taste in his mouth but he denies nausea, vomiting, fever, chills, rash, diarrhea, or loss of appetite.  Outpatient Medications Prior to Visit  Medication Sig Dispense Refill   atorvastatin (LIPITOR) 20 MG tablet Take 1 tablet (20 mg total) by mouth daily. Please schedule yearly appointment for future refills. Thank you 90 tablet 0   famotidine (PEPCID) 20 MG tablet Take 20 mg by mouth 2 (two) times daily.     predniSONE (DELTASONE) 50 MG tablet Take 50 mg by mouth daily with breakfast.     valACYclovir (VALTREX) 1000 MG tablet Take 1,000 mg by mouth 2 (two) times daily.     No facility-administered medications prior to visit.    ROS Review of Systems  Constitutional:  Negative for diaphoresis, fatigue and unexpected weight change.  HENT: Negative.  Negative for facial swelling.   Eyes: Negative.   Respiratory:  Negative for cough, chest tightness, shortness of breath and wheezing.   Cardiovascular:  Negative for chest pain, palpitations and leg swelling.  Gastrointestinal:  Negative for abdominal pain, constipation, diarrhea, nausea and vomiting.  Endocrine: Negative.    Genitourinary:  Negative for difficulty urinating.  Musculoskeletal: Negative.  Negative for back pain, joint swelling and myalgias.  Skin: Negative.   Neurological:  Positive for facial asymmetry. Negative for dizziness, weakness and light-headedness.  Hematological:  Does not bruise/bleed easily.  Psychiatric/Behavioral: Negative.     Objective:  BP 114/68 (BP Location: Left Arm, Patient Position: Sitting, Cuff Size: Large)   Pulse 62   Temp 98.1 F (36.7 C) (Oral)   Resp 16   Ht 6' (1.829 m)   Wt 188 lb (85.3 kg)   SpO2 97%   BMI 25.50 kg/m   BP Readings from Last 3 Encounters:  12/08/20 114/68  12/01/20 102/60  11/18/20 124/76    Wt Readings from Last 3 Encounters:  12/08/20 188 lb (85.3 kg)  12/01/20 186 lb 12.8 oz (84.7 kg)  11/18/20 185 lb (83.9 kg)    Physical Exam Vitals reviewed.  Constitutional:      Appearance: Normal appearance.  HENT:     Head:     Comments: Right peripheral facial palsy    Nose: Nose normal.     Mouth/Throat:     Mouth: Mucous membranes are moist.  Eyes:     Conjunctiva/sclera: Conjunctivae normal.  Cardiovascular:     Rate and Rhythm: Normal rate and regular rhythm.     Heart sounds: No murmur heard. Pulmonary:     Effort: Pulmonary effort is normal.     Breath sounds: No stridor. No wheezing, rhonchi or rales.  Abdominal:  General: Abdomen is flat.     Palpations: There is no mass.     Tenderness: There is no abdominal tenderness. There is no guarding.     Hernia: No hernia is present.  Musculoskeletal:        General: Normal range of motion.     Cervical back: Neck supple.     Right lower leg: No edema.     Left lower leg: No edema.  Lymphadenopathy:     Cervical: No cervical adenopathy.  Skin:    General: Skin is warm and dry.     Findings: No rash.  Neurological:     Mental Status: He is alert.     Cranial Nerves: Cranial nerve deficit, dysarthria and facial asymmetry present.     Sensory: No sensory deficit.      Motor: Motor function is intact. No weakness.     Coordination: Coordination is intact. Coordination normal.     Gait: Gait normal.     Deep Tendon Reflexes: Reflexes normal.  Psychiatric:        Mood and Affect: Mood normal.    Lab Results  Component Value Date   WBC 7.2 11/11/2020   HGB 14.7 11/11/2020   HCT 43.2 11/11/2020   PLT 188.0 11/11/2020   GLUCOSE 94 11/11/2020   CHOL 171 11/11/2020   TRIG 93.0 11/11/2020   HDL 48.20 11/11/2020   LDLCALC 104 (H) 11/11/2020   ALT 30 11/11/2020   AST 19 11/11/2020   NA 139 11/11/2020   K 4.7 11/11/2020   CL 104 11/11/2020   CREATININE 1.19 11/11/2020   BUN 20 11/11/2020   CO2 28 11/11/2020   TSH 1.68 11/11/2020   PSA 1.71 11/11/2020   INR 1.0 12/10/2013   HGBA1C 5.3 12/28/2015    CT RENAL STONE STUDY  Result Date: 10/18/2014 CLINICAL DATA:  Left upper flank pain. EXAM: CT ABDOMEN AND PELVIS WITHOUT CONTRAST TECHNIQUE: Multidetector CT imaging of the abdomen and pelvis was performed following the standard protocol without IV contrast. COMPARISON:  None. FINDINGS: Lung bases are clear. Postoperative changes in the heart which are incompletely evaluated on this examination. Negative for free air. Unenhanced CT was performed per clinician order. Lack of IV contrast limits sensitivity and specificity, especially for evaluation of abdominal/pelvic solid viscera. Normal appearance of the gallbladder, liver and spleen. Normal appearance of the pancreas and adrenal glands. No evidence for kidney stones or hydronephrosis. No acute abnormality involving the kidneys or urinary bladder. Possible low-density cyst in the left kidney upper pole. Normal appearance of the seminal vesicles and prostate. Moderate amount of stool in the colon. Normal appearance of the small bowel and appendix. There appears to be bilateral inguinal hernias containing fat. Difficult to exclude previous surgery in the inguinal areas, particularly on the left side. There is  no significant free fluid or lymphadenopathy within the abdomen or pelvis. Internal fixation in the left femoral head and proximal left femur. No acute bone abnormalities. IMPRESSION: No acute abnormalities in the abdomen or pelvis. Specifically, no evidence for kidney stones or hydronephrosis. Electronically Signed   By: Richarda Overlie M.D.   On: 10/18/2014 08:53    Assessment & Plan:   Charvez was seen today for follow-up.  Diagnoses and all orders for this visit:  Right-sided Bell's palsy- Screening for Lyme disease is negative.  I recommended that he continue the steroids and antiviral. -     SARS-COV-2 IgG; Future -     B. burgdorfi antibodies by WB;  Future -     B. burgdorfi antibodies by WB -     SARS-COV-2 IgG  Gastroenteritis, acute- His COVID antibody is positive.  I think this explains his symptoms.  His symptoms have resolved he has presented too late to be treated with an antiviral medication. -     SARS-COV-2 IgG; Future -     SARS-COV-2 IgG  I am having [name] maintain his atorvastatin, famotidine, predniSONE, and valACYclovir.  No orders of the defined types were placed in this encounter.    Follow-up: Return in about 4 weeks (around 01/05/2021).  Sanda Linger, MD

## 2020-12-10 ENCOUNTER — Telehealth: Payer: Self-pay | Admitting: Internal Medicine

## 2020-12-10 LAB — B. BURGDORFI ANTIBODIES BY WB

## 2020-12-10 NOTE — Telephone Encounter (Signed)
Pt has been informed and expressed understanding.  

## 2020-12-10 NOTE — Telephone Encounter (Signed)
  Patients wife called and was wondering Dr. Yetta Barre opinion for acupuncture for bells palsy. She said that she has heard that it can help. She was also wondering if Dr. Yetta Barre could recommend anyone. Please advise

## 2020-12-14 ENCOUNTER — Encounter: Payer: Self-pay | Admitting: Licensed Clinical Social Worker

## 2020-12-14 ENCOUNTER — Telehealth: Payer: Self-pay | Admitting: Licensed Clinical Social Worker

## 2020-12-14 ENCOUNTER — Ambulatory Visit: Payer: Self-pay | Admitting: Licensed Clinical Social Worker

## 2020-12-14 DIAGNOSIS — Z1379 Encounter for other screening for genetic and chromosomal anomalies: Secondary | ICD-10-CM | POA: Insufficient documentation

## 2020-12-14 DIAGNOSIS — Z803 Family history of malignant neoplasm of breast: Secondary | ICD-10-CM

## 2020-12-14 NOTE — Telephone Encounter (Signed)
Revealed negative genetic testing.   This normal result is reassuring. It is unlikely that there is an increased risk of another cancer due to a mutation in one of these genes.  However, genetic testing is not perfect, and cannot definitively rule out a hereditary cause.  It will be important for him to keep in contact with genetics to learn if any additional testing may be needed in the future.

## 2020-12-14 NOTE — Progress Notes (Signed)
HPI:  Mr. Corvino was previously seen in the [place] clinic due to a family history of cancer and concerns regarding a hereditary predisposition to cancer. Please refer to our prior cancer genetics clinic note for more information regarding our discussion, assessment and recommendations, at the time. Mr. Blumenfeld recent genetic test results were disclosed to him, as were recommendations warranted by these results. These results and recommendations are discussed in more detail below.  CANCER HISTORY:  Oncology History   No history exists.    FAMILY HISTORY:  We obtained a detailed, 4-generation family history.  Significant diagnoses are listed below: Family History  Problem Relation Age of Onset   Breast cancer Mother 78   Liver cancer Mother 8   Drug abuse Neg Hx    Early death Neg Hx    Heart disease Neg Hx    Hyperlipidemia Neg Hx    Hypertension Neg Hx    Kidney disease Neg Hx    Stroke Neg Hx    Arthritis Neg Hx    Mr. Brucato has a son and a daughter, no cancers. His daughter-in-law was recently diagnosed with triple negative stage I breast cancer and has a BRCA mutation. Mr. Yardley has 2 brothers, no cancers.   Mr. Hankerson mother was diagnosed with breast cancer at 77, liver cancer at 63 and died at 42. Patient had 1 maternal uncle, no cancer. No cancers for his maternal first cousins, he does note that his mother's maternal first cousins (there were approximately 15-20) had lots of cancers, unknown types. Maternal grandmother died at 75, grandfather died at 29.   Mr. Ureta father is living at 42, no cancer history. Patient had 4 paternal uncles, 1 paternal aunt, no cancers. No known cancers in paternal cousins or for paternal grandparents.    Mr. Guia is unaware of previous family history of genetic testing for hereditary cancer risks. Patient's maternal ancestors are of [place] and Ashkenazi Jewish descent, and paternal ancestors are of [place] and  Ashkenazi Jewish descent. There is no known consanguinity.      GENETIC TEST RESULTS: Genetic testing reported out on 12/11/2020 through the [facility] CancerNext-Expanded+RNA cancer panel found no pathogenic mutations.   The CancerNext-Expanded + RNAinsight gene panel offered by [facility] and includes sequencing and rearrangement analysis for the following 77 genes: IP, ALK, APC*, ATM*, AXIN2, BAP1, BARD1, BLM, BMPR1A, BRCA1*, BRCA2*, BRIP1*, CDC73, CDH1*,CDK4, CDKN1B, CDKN2A, CHEK2*, CTNNA1, DICER1, FANCC, FH, FLCN, GALNT12, KIF1B, LZTR1, MAX, MEN1, MET, MLH1*, MSH2*, MSH3, MSH6*, MUTYH*, NBN, NF1*, NF2, NTHL1, PALB2*, PHOX2B, PMS2*, POT1, PRKAR1A, PTCH1, PTEN*, RAD51C*, RAD51D*,RB1, RECQL, RET, SDHA, SDHAF2, SDHB, SDHC, SDHD, SMAD4, SMARCA4, SMARCB1, SMARCE1, STK11, SUFU, TMEM127, TP53*,TSC1, TSC2, VHL and XRCC2 (sequencing and deletion/duplication); EGFR, EGLN1, HOXB13, KIT, MITF, PDGFRA, POLD1 and POLE (sequencing only); EPCAM and GREM1 (deletion/duplication only).   The test report has been scanned into EPIC and is located under the Molecular Pathology section of the Results Review tab.  A portion of the result report is included below for reference.     We discussed that because current genetic testing is not perfect, it is possible there may be a gene mutation in one of these genes that current testing cannot detect, but that chance is small.  There could be another gene that has not yet been discovered, or that we have not yet tested, that is responsible for the cancer diagnoses in the family. It is also possible there is a hereditary cause for the cancer in the  family that Mr. Morissette did not inherit and therefore was not identified in his testing.  Therefore, it is important to remain in touch with cancer genetics in the future so that we can continue to offer Mr. Morey the most up to date genetic testing.   ADDITIONAL GENETIC TESTING: We discussed with Mr. Fern that his genetic testing  was fairly extensive.  If there are genes identified to increase cancer risk that can be analyzed in the future, we would be happy to discuss and coordinate this testing at that time.    CANCER SCREENING RECOMMENDATIONS: Mr. Reinheimer's test result is considered negative (normal).  This means that we have not identified a hereditary cause for his family history of cancer at this time.   While reassuring, this does not definitively rule out a hereditary predisposition to cancer. It is still possible that there could be genetic mutations that are undetectable by current technology. There could be genetic mutations in genes that have not been tested or identified to increase cancer risk.  Therefore, it is recommended he continue to follow the cancer management and screening guidelines provided by his primary healthcare provider.   An individual's cancer risk and medical management are not determined by genetic test results alone. Overall cancer risk assessment incorporates additional factors, including personal medical history, family history, and any available genetic information that may result in a personalized plan for cancer prevention and surveillance.  RECOMMENDATIONS FOR FAMILY MEMBERS:  Relatives in this family might be at some increased risk of developing cancer, over the general population risk, simply due to the family history of cancer.  We recommended male relatives in this family have a yearly mammogram beginning at age 40, or 10 years younger than the earliest onset of cancer, an annual clinical breast exam, and perform monthly breast self-exams. Male relatives in this family should also have a gynecological exam as recommended by their primary provider.  All family members should be referred for colonoscopy starting at age 45.    It is also possible there is a hereditary cause for the cancer in Mr. Tisdel's family that he did not inherit and therefore was not identified in him.  Based on  Mr. Orrison's family history, we recommended maternal relatives, including his brothers, have genetic counseling and testing. Mr. Schadler will let us know if we can be of any assistance in coordinating genetic counseling and/or testing for these family members.  FOLLOW-UP: Lastly, we discussed with Mr. Sahlin that cancer genetics is a rapidly advancing field and it is possible that new genetic tests will be appropriate for him and/or his family members in the future. We encouraged him to remain in contact with cancer genetics on an annual basis so we can update his personal and family histories and let him know of advances in cancer genetics that may benefit this family.   Our contact number was provided. Mr. Basques's questions were answered to his satisfaction, and he knows he is welcome to call us at anytime with additional questions or concerns.   Brianna Cowan, MS, LCGC Genetic Counselor [email or web address] Phone: [phone]  

## 2020-12-16 ENCOUNTER — Other Ambulatory Visit: Payer: Self-pay | Admitting: Internal Medicine

## 2020-12-16 ENCOUNTER — Telehealth: Payer: Self-pay | Admitting: Internal Medicine

## 2020-12-16 DIAGNOSIS — R29810 Facial weakness: Secondary | ICD-10-CM | POA: Insufficient documentation

## 2020-12-16 NOTE — Telephone Encounter (Signed)
  Spouse calling for advice , she wants to be sure the diagnosis of Bell's Palsy is correct She is requesting order for MRI to be sure patient did not have a "tumor that burst"  Please advise

## 2020-12-16 NOTE — Telephone Encounter (Signed)
Pt's spouse has been informed that referral for MRI was ordered. Expressed thanks and understanding.

## 2020-12-18 ENCOUNTER — Ambulatory Visit
Admission: RE | Admit: 2020-12-18 | Discharge: 2020-12-18 | Disposition: A | Discharge status: Home / Self Care | Payer: 59 | Source: Ambulatory Visit | Attending: Internal Medicine | Admitting: Internal Medicine

## 2020-12-18 ENCOUNTER — Other Ambulatory Visit: Payer: 59

## 2020-12-18 ENCOUNTER — Other Ambulatory Visit: Payer: Self-pay

## 2020-12-18 DIAGNOSIS — R29810 Facial weakness: Secondary | ICD-10-CM

## 2020-12-20 ENCOUNTER — Telehealth: Payer: Self-pay | Admitting: Internal Medicine

## 2020-12-20 NOTE — Telephone Encounter (Signed)
Requesting callback to go over the MRI results.   Please advise.

## 2020-12-21 NOTE — Telephone Encounter (Signed)
Result reviewed with patient

## 2021-01-20 LAB — PSA: PSA: 1.9

## 2021-01-22 ENCOUNTER — Encounter (HOSPITAL_COMMUNITY): Payer: Self-pay | Admitting: Emergency Medicine

## 2021-01-22 ENCOUNTER — Emergency Department (HOSPITAL_COMMUNITY)
Admission: EM | Admit: 2021-01-22 | Discharge: 2021-01-22 | Disposition: A | Discharge status: Home / Self Care | Payer: 59 | Attending: Emergency Medicine | Admitting: Emergency Medicine

## 2021-01-22 ENCOUNTER — Other Ambulatory Visit: Payer: Self-pay

## 2021-01-22 ENCOUNTER — Emergency Department (HOSPITAL_COMMUNITY): Payer: 59

## 2021-01-22 DIAGNOSIS — S62313A Displaced fracture of base of third metacarpal bone, left hand, initial encounter for closed fracture: Secondary | ICD-10-CM | POA: Diagnosis not present

## 2021-01-22 DIAGNOSIS — S0081XA Abrasion of other part of head, initial encounter: Secondary | ICD-10-CM | POA: Insufficient documentation

## 2021-01-22 DIAGNOSIS — W010XXA Fall on same level from slipping, tripping and stumbling without subsequent striking against object, initial encounter: Secondary | ICD-10-CM | POA: Insufficient documentation

## 2021-01-22 DIAGNOSIS — I10 Essential (primary) hypertension: Secondary | ICD-10-CM | POA: Insufficient documentation

## 2021-01-22 DIAGNOSIS — S62331A Displaced fracture of neck of second metacarpal bone, left hand, initial encounter for closed fracture: Secondary | ICD-10-CM

## 2021-01-22 DIAGNOSIS — S0031XA Abrasion of nose, initial encounter: Secondary | ICD-10-CM | POA: Diagnosis not present

## 2021-01-22 DIAGNOSIS — S62343A Nondisplaced fracture of base of third metacarpal bone, left hand, initial encounter for closed fracture: Secondary | ICD-10-CM

## 2021-01-22 DIAGNOSIS — S62301A Unspecified fracture of second metacarpal bone, left hand, initial encounter for closed fracture: Secondary | ICD-10-CM | POA: Insufficient documentation

## 2021-01-22 DIAGNOSIS — S6992XA Unspecified injury of left wrist, hand and finger(s), initial encounter: Secondary | ICD-10-CM | POA: Diagnosis present

## 2021-01-22 MED ORDER — OXYCODONE HCL 5 MG PO TABS: 5.0000 mg | ORAL_TABLET | Freq: Four times a day (QID) | ORAL | 0 refills | Status: AC | PRN

## 2021-01-22 MED ORDER — OXYCODONE-ACETAMINOPHEN 5-325 MG PO TABS
1.0000 | ORAL_TABLET | Freq: Once | ORAL | Status: AC
  Administered 2021-01-22: 1 via ORAL
  Filled 2021-01-22: qty 1

## 2021-01-22 NOTE — ED Notes (Signed)
Wedding band removed with the aid of lubrication, given to wife in a sealed cup.

## 2021-01-22 NOTE — ED Triage Notes (Signed)
Arrives via EMS from a house, had a mechanical fall, missed a step. Complains of L knee and elbow abrasions, bruising to nose. Denies hitting head (obvious new bruising to nose), no LOC, no neck or back pain.

## 2021-01-22 NOTE — Discharge Instructions (Addendum)
After speaking with a hand surgeon, he does not believe you need surgery today.  We have placed you in a splint, please keep the limb dry and protected and call for evaluation at on Monday morning. I have attached his contact information on your discharge paperwork.  As we discussed you should take the oxycodone every 6 hours, you can take it at the same time as 625 to 1000 mg of Tylenol, alternate with ibuprofen 600 to 800 mg every 6 hours such that you are taking something every 3 hours.

## 2021-01-22 NOTE — ED Notes (Signed)
ED Provider at bedside. 

## 2021-01-22 NOTE — ED Provider Notes (Signed)
[facility]-EMERGENCY DEPT Provider Note   CSN: [id number] Arrival date & time: 01/22/21  1242     History No chief complaint on file.   Cody Barnes is a 60 y.o. male significant past medical history who presents after having a mechanical fall this afternoon at an estate sale.  The patient reports he tripped without losing consciousness and fell on his left side taking the brunt of the fall on his left arm, left elbow, left wrist, left knee, and has some abrasions on the right side of his nose and chin.  Patient does not have significant pain on his face, or left knee, denies impacts with his skull, any concussive symptoms including sensitivity to light, confusion, amnesia, headache.  He is experiencing significant pain in his left wrist and hand.  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    Patient Active Problem List   Diagnosis Date Noted   Weakness, facial 12/16/2020   Genetic testing 12/14/2020   Right-sided Bell's palsy 12/08/2020   Gastroenteritis, acute 12/08/2020   Arthritis of left hip 11/18/2020   Family history of breast cancer 11/12/2020   Acute pain of left thigh 11/11/2020   Abnormal electrocardiogram (ECG) (EKG) 11/11/2020   Essential hypertension 02/27/2019   Seborrheic dermatitis of scalp 05/02/2016   Hyperlipidemia with target LDL less than 130 12/29/2015   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   H/O mitral valve repair 12/16/2015   Mitral regurgitation    Aortic insufficiency    Routine general medical examination at a health care facility 09/21/2011   ALLERGIC RHINITIS 07/29/2010    Past Surgical History:  Procedure Laterality Date   HIP  FRACTURE SURGERY     left   INGUINAL HERNIA REPAIR     as infant   LEFT AND RIGHT HEART CATHETERIZATION WITH CORONARY ANGIOGRAM N/A 12/12/2013   Procedure: LEFT AND RIGHT HEART CATHETERIZATION WITH CORONARY ANGIOGRAM;  Surgeon: Corky Crafts, MD;  Location: [facility] CATH LAB;  Service: Cardiovascular;  Laterality: N/A;   TEE WITHOUT CARDIOVERSION N/A 11/25/2013   Procedure: TRANSESOPHAGEAL ECHOCARDIOGRAM (TEE);  Surgeon: Quintella Reichert, MD;  Location: [facility] ENDOSCOPY;  Service: Cardiovascular;  Laterality: N/A;       Family History  Problem Relation Age of Onset   Breast cancer Mother 92   Liver cancer Mother 57   Drug abuse Neg Hx    Early death Neg Hx    Heart disease Neg Hx    Hyperlipidemia Neg Hx    Hypertension Neg Hx    Kidney disease Neg Hx    Stroke Neg Hx    Arthritis Neg Hx     Social History   Tobacco Use   Smoking status: Never   Smokeless tobacco: Never  Substance Use Topics   Alcohol use: Yes    Alcohol/week: 5.0 standard drinks    Types: 5 Cans of beer per week   Drug use: No    Home Medications Prior to Admission medications   Medication Sig Start Date End Date Taking? Authorizing Provider  atorvastatin (LIPITOR) 20 MG tablet Take 1 tablet (20 mg total) by mouth daily. Please schedule yearly appointment  for future refills. Thank you 11/15/20   Corky Crafts, MD  famotidine (PEPCID) 20 MG tablet Take 20 mg by mouth 2 (two) times daily.    [provider]  predniSONE (DELTASONE) 50 MG tablet Take 50 mg by mouth daily with breakfast.    [provider]  valACYclovir (VALTREX) 1000 MG tablet Take 1,000 mg by mouth 2 (two) times daily.    [provider]    Allergies    Patient has no known allergies.  Review of Systems   Review of Systems  Skin:  Positive for wound.  Neurological:  Negative for dizziness, syncope, speech difficulty and weakness.  Psychiatric/Behavioral:  Negative for confusion.   All other systems  reviewed and are negative.  Physical Exam Updated Vital Signs BP (!) 144/79   Pulse 60   Temp 98.3 F (36.8 C) (Oral)   Resp 18   Ht 6' (1.829 m)   Wt 83.9 kg   SpO2 95%   BMI 25.09 kg/m   Physical Exam Vitals and nursing note reviewed.  Constitutional:      General: He is not in acute distress.    Appearance: Normal appearance.  HENT:     Head: Normocephalic and atraumatic.  Eyes:     General:        Right eye: No discharge.        Left eye: No discharge.  Cardiovascular:     Rate and Rhythm: Normal rate and regular rhythm.     Heart sounds: No murmur heard.   No friction rub. No gallop.     Comments: Intact, 2+ radial and ulnar pulses of the left wrist.  Capillary refill less than 2 seconds distal to the injury. Pulmonary:     Effort: Pulmonary effort is normal.     Breath sounds: Normal breath sounds.  Abdominal:     General: Bowel sounds are normal.     Palpations: Abdomen is soft.  Musculoskeletal:     Comments: Patient is not able to make tight fist, is not able to extend the wrist, flexion limited to 40 degrees.  Patient has full active and passive range of motion of elbow joint.  There is no obvious bony deformity, no evidence of an open fracture.  Skin:    General: Skin is warm and dry.     Capillary Refill: Capillary refill takes less than 2 seconds.     Comments: Patient with shallow, clean, lightly bleeding excoriations to the left knee, ulnar aspect of left arm, right chin, right nose.  Patient with focal swelling without excoriation or open fracture in an approximately 2 to 3 cm diffuse area around the distal second metacarpal  Neurological:     Mental Status: He is alert and oriented to person, place, and time.  Psychiatric:        Mood and Affect: Mood normal.        Behavior: Behavior normal.    ED Results / Procedures / Treatments   Labs (all labs ordered are listed, but only abnormal results are displayed) Labs Reviewed - No data to  display  EKG None  Radiology DG Elbow Complete Left  Result Date: 01/22/2021 CLINICAL DATA:  Left elbow pain after fall. EXAM: LEFT ELBOW - COMPLETE 3+ VIEW COMPARISON:  None. FINDINGS: There is no evidence of fracture, dislocation, or joint effusion. There is no evidence of arthropathy or other focal bone abnormality. Soft tissues are unremarkable. IMPRESSION: Negative. Electronically Signed   By: Fayrene Fearing  Christen Butter M.D.   On: 01/22/2021 13:32   DG Hand Complete Left  Result Date: 01/22/2021 CLINICAL DATA:  Left hand pain after fall. EXAM: LEFT HAND - COMPLETE 3+ VIEW COMPARISON:  None. FINDINGS: Severely displaced and comminuted fracture is seen involving the second metacarpal. Minimally displaced fracture is seen involving the proximal portion of the third metacarpal. IMPRESSION: Severely displaced and comminuted second metacarpal fracture. Minimally displaced proximal third metacarpal fracture. Electronically Signed   By: Lupita Raider M.D.   On: 01/22/2021 13:31    Procedures Procedures   Medications Ordered in ED Medications  oxyCODONE-acetaminophen (PERCOCET/ROXICET) 5-325 MG per tablet 1 tablet (1 tablet Oral Given 01/22/21 1326)    ED Course  I have reviewed the triage vital signs and the nursing notes.  Pertinent labs & imaging results that were available during my care of the patient were reviewed by me and considered in my medical decision making (see chart for details).    MDM Rules/Calculators/A&P                         No evidence of tenderness or deformity on the face, or left knee so no further imaging was performed.  No concern for concussion based on patient's story, lack of loss of consciousness, no concussive symptoms.  Surface lacerations to be treated with topical antibacterial ointment and bandaging.  Left elbow, radius and ulna without acute fracture or other deformity at this time. " Severely displaced and comminuted fracture is seen involving the second  metacarpal.  Minimally displaced fracture is seen involving the proximal portion of the third metacarpal."  Patient is neurovascularly intact proximal and distal to the site of injury, not an open fracture. There is no concern for compartment syndrome at this time.  Will talk to orthopedic hand surgeon about outpatient follow-up for surgery.  After consultation with Dr. Izora Ribas, plan to place in radial gutter splint and follow-up outpatient for surgery evaluation on Monday. Patient given prescription for oxycodone for pain control at home, encouraged alternating with ibuprofen, tylenol.  Final Clinical Impression(s) / ED Diagnoses Final diagnoses:  None    Rx / DC Orders ED Discharge Orders     None        Olene Floss, PA-C 01/22/21 1448    Virgina Norfolk, DO 01/22/21 1449

## 2021-01-22 NOTE — ED Notes (Signed)
Patient transported to X-ray via stretcher 

## 2021-01-24 ENCOUNTER — Encounter (HOSPITAL_COMMUNITY): Payer: Self-pay | Admitting: Orthopedic Surgery

## 2021-01-24 ENCOUNTER — Other Ambulatory Visit: Payer: Self-pay

## 2021-01-24 ENCOUNTER — Telehealth: Payer: Self-pay

## 2021-01-24 DIAGNOSIS — S6992XA Unspecified injury of left wrist, hand and finger(s), initial encounter: Secondary | ICD-10-CM | POA: Insufficient documentation

## 2021-01-24 DIAGNOSIS — R52 Pain, unspecified: Secondary | ICD-10-CM | POA: Insufficient documentation

## 2021-01-24 DIAGNOSIS — M79642 Pain in left hand: Secondary | ICD-10-CM | POA: Insufficient documentation

## 2021-01-24 NOTE — Telephone Encounter (Signed)
Please advise as pts spouse is calling and stated pt was referred to a hand surgeon from the hospital due to fracture in left hand. However, the surgeon that was referred is out of network.

## 2021-01-24 NOTE — Progress Notes (Signed)
Spoke with pt's wife, Eunice Blase for pre-op call. DPR on file. Pt has hx of Aortic and Mitral valve repair in 2017. She states pt does not have any heart problems, denies HTN and diabetes.   Instructed Debbie to call the OR desk (phone umber given) at 8 AM and tell them that pt is scheduled for surgery and that they need to know the start time. Told her that once she gets the start time, patient is to arrive 2.5 hours prior to start time. She voiced understanding.   Pt's surgery is scheduled as ambulatory so no Covid test is required prior to surgery.

## 2021-01-25 ENCOUNTER — Ambulatory Visit (HOSPITAL_COMMUNITY): Payer: 59 | Admitting: Anesthesiology

## 2021-01-25 ENCOUNTER — Encounter (HOSPITAL_COMMUNITY): Payer: Self-pay | Admitting: Orthopedic Surgery

## 2021-01-25 ENCOUNTER — Ambulatory Visit (HOSPITAL_COMMUNITY)
Admission: RE | Admit: 2021-01-25 | Discharge: 2021-01-25 | Disposition: A | Discharge status: Home / Self Care | Payer: 59 | Attending: Orthopedic Surgery | Admitting: Orthopedic Surgery

## 2021-01-25 ENCOUNTER — Ambulatory Visit (HOSPITAL_COMMUNITY): Payer: 59

## 2021-01-25 ENCOUNTER — Encounter (HOSPITAL_COMMUNITY)
Admission: RE | Disposition: A | Discharge status: Home / Self Care | Payer: Self-pay | Source: Home / Self Care | Attending: Orthopedic Surgery

## 2021-01-25 DIAGNOSIS — Z79899 Other long term (current) drug therapy: Secondary | ICD-10-CM | POA: Diagnosis not present

## 2021-01-25 DIAGNOSIS — X58XXXA Exposure to other specified factors, initial encounter: Secondary | ICD-10-CM | POA: Diagnosis not present

## 2021-01-25 DIAGNOSIS — S62313A Displaced fracture of base of third metacarpal bone, left hand, initial encounter for closed fracture: Secondary | ICD-10-CM | POA: Diagnosis not present

## 2021-01-25 DIAGNOSIS — S62311A Displaced fracture of base of second metacarpal bone. left hand, initial encounter for closed fracture: Secondary | ICD-10-CM | POA: Diagnosis present

## 2021-01-25 DIAGNOSIS — Z953 Presence of xenogenic heart valve: Secondary | ICD-10-CM | POA: Diagnosis not present

## 2021-01-25 HISTORY — PX: OPEN REDUCTION INTERNAL FIXATION (ORIF) METACARPAL: SHX6234

## 2021-01-25 HISTORY — DX: Bell's palsy: G51.0

## 2021-01-25 LAB — CBC
HCT: 46.2 % (ref 39.0–52.0)
Hemoglobin: 15.3 g/dL (ref 13.0–17.0)
MCH: 30.5 pg (ref 26.0–34.0)
MCHC: 33.1 g/dL (ref 30.0–36.0)
MCV: 92 fL (ref 80.0–100.0)
Platelets: 194 10*3/uL (ref 150–400)
RBC: 5.02 MIL/uL (ref 4.22–5.81)
RDW: 12.2 % (ref 11.5–15.5)
WBC: 6.8 10*3/uL (ref 4.0–10.5)
nRBC: 0 % (ref 0.0–0.2)

## 2021-01-25 SURGERY — OPEN REDUCTION INTERNAL FIXATION (ORIF) METACARPAL
Anesthesia: Monitor Anesthesia Care | Site: Finger | Laterality: Left

## 2021-01-25 MED ORDER — BUPIVACAINE-EPINEPHRINE (PF) 0.5% -1:200000 IJ SOLN
INTRAMUSCULAR | Status: DC | PRN
  Administered 2021-01-25: 30 mL via PERINEURAL

## 2021-01-25 MED ORDER — PROPOFOL 500 MG/50ML IV EMUL
INTRAVENOUS | Status: DC | PRN
  Administered 2021-01-25: 75 ug/kg/min via INTRAVENOUS

## 2021-01-25 MED ORDER — CEFAZOLIN SODIUM-DEXTROSE 2-4 GM/100ML-% IV SOLN
2.0000 g | INTRAVENOUS | Status: AC
  Administered 2021-01-25: 2 g via INTRAVENOUS
  Filled 2021-01-25: qty 100

## 2021-01-25 MED ORDER — ORAL CARE MOUTH RINSE: 15.0000 mL | Freq: Once | OROMUCOSAL | Status: AC

## 2021-01-25 MED ORDER — FENTANYL CITRATE (PF) 100 MCG/2ML IJ SOLN: 50.0000 ug | Freq: Once | INTRAMUSCULAR | Status: AC

## 2021-01-25 MED ORDER — MIDAZOLAM HCL 2 MG/2ML IJ SOLN
INTRAMUSCULAR | Status: AC
  Administered 2021-01-25: 1 mg via INTRAVENOUS
  Filled 2021-01-25: qty 2

## 2021-01-25 MED ORDER — FENTANYL CITRATE (PF) 250 MCG/5ML IJ SOLN
INTRAMUSCULAR | Status: AC
  Filled 2021-01-25: qty 5

## 2021-01-25 MED ORDER — FENTANYL CITRATE (PF) 100 MCG/2ML IJ SOLN: 25.0000 ug | INTRAMUSCULAR | Status: DC | PRN

## 2021-01-25 MED ORDER — MIDAZOLAM HCL 2 MG/2ML IJ SOLN: 1.0000 mg | Freq: Once | INTRAMUSCULAR | Status: AC

## 2021-01-25 MED ORDER — MIDAZOLAM HCL 5 MG/5ML IJ SOLN
INTRAMUSCULAR | Status: DC | PRN
  Administered 2021-01-25: 1 mg via INTRAVENOUS

## 2021-01-25 MED ORDER — LIDOCAINE 2% (20 MG/ML) 5 ML SYRINGE
INTRAMUSCULAR | Status: DC | PRN
  Administered 2021-01-25: 60 mg via INTRAVENOUS

## 2021-01-25 MED ORDER — 0.9 % SODIUM CHLORIDE (POUR BTL) OPTIME
TOPICAL | Status: DC | PRN
  Administered 2021-01-25: 1000 mL

## 2021-01-25 MED ORDER — FENTANYL CITRATE (PF) 100 MCG/2ML IJ SOLN
INTRAMUSCULAR | Status: AC
  Administered 2021-01-25: 50 ug via INTRAVENOUS
  Filled 2021-01-25: qty 2

## 2021-01-25 MED ORDER — PROPOFOL 10 MG/ML IV BOLUS
INTRAVENOUS | Status: AC
  Filled 2021-01-25: qty 20

## 2021-01-25 MED ORDER — OXYCODONE HCL 5 MG PO TABS: 5.0000 mg | ORAL_TABLET | Freq: Once | ORAL | Status: DC | PRN

## 2021-01-25 MED ORDER — CHLORHEXIDINE GLUCONATE 0.12 % MT SOLN
15.0000 mL | Freq: Once | OROMUCOSAL | Status: AC
  Administered 2021-01-25: 15 mL via OROMUCOSAL
  Filled 2021-01-25: qty 15

## 2021-01-25 MED ORDER — OXYCODONE HCL 5 MG/5ML PO SOLN
5.0000 mg | Freq: Once | ORAL | Status: DC | PRN
Start: 2021-01-25 — End: 2021-01-26

## 2021-01-25 MED ORDER — MIDAZOLAM HCL 2 MG/2ML IJ SOLN
INTRAMUSCULAR | Status: AC
  Filled 2021-01-25: qty 2

## 2021-01-25 MED ORDER — PROMETHAZINE HCL 25 MG/ML IJ SOLN
6.2500 mg | INTRAMUSCULAR | Status: DC | PRN
Start: 2021-01-25 — End: 2021-01-26

## 2021-01-25 MED ORDER — LACTATED RINGERS IV SOLN: INTRAVENOUS | Status: DC

## 2021-01-25 SURGICAL SUPPLY — 54 items
BAG COUNTER SPONGE SURGICOUNT (BAG) ×2 IMPLANT
BAG SPNG CNTER NS LX DISP (BAG) ×1
BNDG CMPR 9X4 STRL LF SNTH (GAUZE/BANDAGES/DRESSINGS) ×1
BNDG ELASTIC 3X5.8 VLCR STR LF (GAUZE/BANDAGES/DRESSINGS) ×2 IMPLANT
BNDG ELASTIC 4X5.8 VLCR STR LF (GAUZE/BANDAGES/DRESSINGS) ×2 IMPLANT
BNDG ESMARK 4X9 LF (GAUZE/BANDAGES/DRESSINGS) ×2 IMPLANT
BNDG GAUZE ELAST 4 BULKY (GAUZE/BANDAGES/DRESSINGS) ×4 IMPLANT
CORD BIPOLAR FORCEPS 12FT (ELECTRODE) ×2 IMPLANT
COVER SURGICAL LIGHT HANDLE (MISCELLANEOUS) ×2 IMPLANT
CUFF TOURN SGL QUICK 18X4 (TOURNIQUET CUFF) ×2 IMPLANT
CUFF TOURN SGL QUICK 24 (TOURNIQUET CUFF)
CUFF TRNQT CYL 24X4X16.5-23 (TOURNIQUET CUFF) IMPLANT
DRAIN TLS ROUND 10FR (DRAIN) IMPLANT
DRAPE OEC MINIVIEW 54X84 (DRAPES) IMPLANT
DRAPE SURG 17X23 STRL (DRAPES) ×2 IMPLANT
DRSG EMULSION OIL 3X3 NADH (GAUZE/BANDAGES/DRESSINGS) ×1 IMPLANT
DRSG XEROFORM 1X8 (GAUZE/BANDAGES/DRESSINGS) ×1 IMPLANT
GAUZE SPONGE 4X4 12PLY STRL (GAUZE/BANDAGES/DRESSINGS) ×2 IMPLANT
GAUZE XEROFORM 1X8 LF (GAUZE/BANDAGES/DRESSINGS) ×2 IMPLANT
GLOVE SURG ENC TEXT LTX SZ8 (GLOVE) ×2 IMPLANT
GLOVE SURG MICRO LTX SZ8 (GLOVE) ×2 IMPLANT
GOWN STRL REUS W/ TWL LRG LVL3 (GOWN DISPOSABLE) ×1 IMPLANT
GOWN STRL REUS W/ TWL XL LVL3 (GOWN DISPOSABLE) ×1 IMPLANT
GOWN STRL REUS W/TWL LRG LVL3 (GOWN DISPOSABLE) ×2
GOWN STRL REUS W/TWL XL LVL3 (GOWN DISPOSABLE) ×2
KIT BASIN OR (CUSTOM PROCEDURE TRAY) ×2 IMPLANT
KIT INNATE INSTRUMENT FOR 3.6 (INSTRUMENTS) ×1 IMPLANT
KIT INNATE INSTRUMENT FOR 4.5 (INSTRUMENTS) ×1 IMPLANT
KIT TURNOVER KIT B (KITS) ×2 IMPLANT
NAIL IM THRD INNATE 4.5X65 (Nail) ×1 IMPLANT
NEEDLE 22X1 1/2 (OR ONLY) (NEEDLE) IMPLANT
NS IRRIG 1000ML POUR BTL (IV SOLUTION) ×2 IMPLANT
PACK ORTHO EXTREMITY (CUSTOM PROCEDURE TRAY) ×2 IMPLANT
PAD ARMBOARD 7.5X6 YLW CONV (MISCELLANEOUS) ×4 IMPLANT
PAD CAST 3X4 CTTN HI CHSV (CAST SUPPLIES) ×1 IMPLANT
PAD CAST 4YDX4 CTTN HI CHSV (CAST SUPPLIES) ×1 IMPLANT
PADDING CAST COTTON 3X4 STRL (CAST SUPPLIES) ×2
PADDING CAST COTTON 4X4 STRL (CAST SUPPLIES) ×2
SLING ARM FOAM STRAP LRG (SOFTGOODS) ×1 IMPLANT
SOL PREP POV-IOD 4OZ 10% (MISCELLANEOUS) ×4 IMPLANT
SPLINT FIBERGLASS 3X12 (CAST SUPPLIES) ×1 IMPLANT
SPONGE T-LAP 4X18 ~~LOC~~+RFID (SPONGE) ×1 IMPLANT
SUT MNCRL AB 4-0 PS2 18 (SUTURE) ×1 IMPLANT
SUT PROLENE 3 0 PS 2 (SUTURE) IMPLANT
SUT PROLENE 4 0 P 3 18 (SUTURE) ×2 IMPLANT
SUT VIC AB 3-0 FS2 27 (SUTURE) IMPLANT
SUT VIC AB 4-0 PS2 18 (SUTURE) ×1 IMPLANT
SYR CONTROL 10ML LL (SYRINGE) IMPLANT
SYSTEM CHEST DRAIN TLS 7FR (DRAIN) IMPLANT
TOWEL GREEN STERILE (TOWEL DISPOSABLE) ×2 IMPLANT
TOWEL GREEN STERILE FF (TOWEL DISPOSABLE) ×2 IMPLANT
TUBE CONNECTING 12X1/4 (SUCTIONS) ×2 IMPLANT
TUBE EVACUATION TLS (MISCELLANEOUS) ×1 IMPLANT
WATER STERILE IRR 1000ML POUR (IV SOLUTION) ×2 IMPLANT

## 2021-01-25 NOTE — Anesthesia Preprocedure Evaluation (Addendum)
Anesthesia Evaluation  Patient identified by MRN, date of birth, ID band Patient awake    Reviewed: Allergy & Precautions, NPO status , Patient's Chart, lab work & pertinent test results  History of Anesthesia Complications Negative for: history of anesthetic complications  Airway Mallampati: III  TM Distance: >3 FB Neck ROM: Full  Mouth opening: Limited Mouth Opening  Dental  (+) Dental Advisory Given   Pulmonary neg pulmonary ROS,    Pulmonary exam normal        Cardiovascular (-) hypertensionNormal cardiovascular exam+ Valvular Problems/Murmurs (s/p AVR and MV repair)    '20 TTE - EF 60-65%. Left ventricular diastolic Doppler parameters are consistent with impaired relaxation. Right atrial size was mildly dilated. A annuloplasty ring valve is present in the mitral position. The prosthetic AV appears to be functioning regularly.     Neuro/Psych  Bell's palsy  negative psych ROS   GI/Hepatic negative GI ROS, Neg liver ROS,   Endo/Other  negative endocrine ROS  Renal/GU negative Renal ROS     Musculoskeletal  (+) Arthritis ,   Abdominal   Peds  Hematology negative hematology ROS (+)   Anesthesia Other Findings   Reproductive/Obstetrics                            Anesthesia Physical Anesthesia Plan  ASA: 2  Anesthesia Plan: Regional   Post-op Pain Management:    Induction:   PONV Risk Score and Plan: 1 and Propofol infusion and Treatment may vary due to age or medical condition  Airway Management Planned: Nasal Cannula and Natural Airway  Additional Equipment: None  Intra-op Plan:   Post-operative Plan:   Informed Consent: I have reviewed the patients History and Physical, chart, labs and discussed the procedure including the risks, benefits and alternatives for the proposed anesthesia with the patient or authorized representative who has indicated his/her understanding and  acceptance.       Plan Discussed with: CRNA and Anesthesiologist  Anesthesia Plan Comments:        Anesthesia Quick Evaluation

## 2021-01-25 NOTE — Anesthesia Procedure Notes (Signed)
Anesthesia Regional Block: Axillary brachial plexus block   Pre-Anesthetic Checklist: , timeout performed,  Correct Patient, Correct Site, Correct Laterality,  Correct Procedure, Correct Position, site marked,  Risks and benefits discussed,  Surgical consent,  Pre-op evaluation,  At surgeon's request and post-op pain management  Laterality: Left  Prep: chloraprep       Needles:  Injection technique: Single-shot  Needle Type: Echogenic Needle     Needle Length: 5cm  Needle Gauge: 21     Additional Needles:   Narrative:  Start time: 01/25/2021 4:52 PM End time: 01/25/2021 4:56 PM Injection made incrementally with aspirations every 5 mL.  Performed by: Personally  Anesthesiologist: Beryle Lathe, MD  Additional Notes: No pain on injection. No increased resistance to injection. Injection made in 5cc increments. Good needle visualization. Patient tolerated the procedure well.

## 2021-01-25 NOTE — Transfer of Care (Signed)
Immediate Anesthesia Transfer of Care Note  Patient: Cody Barnes  Procedure(s) Performed: Left index finger open reduction internal fixation metacarpal fracture and manipulation under anesthesia middle finger base fracture (Left: Finger)  Patient Location: PACU  Anesthesia Type:MAC and Regional  Level of Consciousness: awake, alert  and oriented  Airway & Oxygen Therapy: Patient Spontanous Breathing  Post-op Assessment: Report given to RN and Post -op Vital signs reviewed and stable  Post vital signs: Reviewed and stable  Last Vitals:  Vitals Value Taken Time  BP 134/86 01/25/21 1920  Temp 36.1 C 01/25/21 1920  Pulse 49 01/25/21 1923  Resp 17 01/25/21 1923  SpO2 98 % 01/25/21 1923  Vitals shown include unvalidated device data.  Last Pain:  Vitals:   01/25/21 1700  TempSrc:   PainSc: 0-No pain         Complications: No notable events documented.

## 2021-01-25 NOTE — H&P (Signed)
Cody Barnes is an 60 y.o. male.   Chief Complaint: Comminuted displaced index finger metacarpal fracture left hand with associated third metacarpal fracture at the base HPI: Patient presents for evaluation and treatment of the of their upper extremity predicament. The patient denies neck, back, chest or  abdominal pain. The patient notes that they have no lower extremity problems. The patients primary complaint is noted. We are planning surgical care pathway for the upper extremity.   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    Past Surgical History:  Procedure Laterality Date   AORTIC VALVE REPLACEMENT     HIP FRACTURE SURGERY     left   INGUINAL HERNIA REPAIR     as infant   LEFT AND RIGHT HEART CATHETERIZATION WITH CORONARY ANGIOGRAM N/A 12/12/2013   Procedure: LEFT AND RIGHT HEART CATHETERIZATION WITH CORONARY ANGIOGRAM;  Surgeon: Corky Crafts, MD;  Location: [facility] CATH LAB;  Service: Cardiovascular;  Laterality: N/A;   MITRAL VALVE REPLACEMENT     TEE WITHOUT CARDIOVERSION N/A 11/25/2013   Procedure: TRANSESOPHAGEAL ECHOCARDIOGRAM (TEE);  Surgeon: Quintella Reichert, MD;  Location: [facility] ENDOSCOPY;  Service: Cardiovascular;  Laterality: N/A;    Family History  Problem Relation Age of Onset   Breast cancer Mother 60   Liver cancer Mother 13   Drug abuse Neg Hx    Early death Neg Hx    Heart disease Neg Hx    Hyperlipidemia Neg Hx    Hypertension Neg Hx    Kidney disease Neg Hx    Stroke Neg Hx    Arthritis Neg Hx    Social History:  reports that he has never smoked. He has never used smokeless tobacco. He reports current alcohol use of about 6.0 standard drinks per week. He reports that he  does not use drugs.  Allergies: No Known Allergies  Medications Prior to Admission  Medication Sig Dispense Refill   atorvastatin (LIPITOR) 20 MG tablet Take 1 tablet (20 mg total) by mouth daily. Please schedule yearly appointment for future refills. Thank you 90 tablet 0   fexofenadine (ALLEGRA) 180 MG tablet Take 180 mg by mouth daily as needed for allergies or rhinitis.     Multiple Vitamins-Minerals (ADULT GUMMY) CHEW Chew 2 capsules by mouth daily.     oxyCODONE (ROXICODONE) 5 MG immediate release tablet Take 1 tablet (5 mg total) by mouth every 6 (six) hours as needed for up to 5 days for severe pain. 15 tablet 0    No results found for this or any previous visit (from the past 48 hour(s)). No results found.  Review of Systems  Respiratory: Negative.    Cardiovascular: Negative.   Gastrointestinal: Negative.   Endocrine: Negative.    Blood pressure (!) 174/96, pulse (!) 56, temperature 98 F (36.7 C), temperature source Oral, resp. rate 20, height 6' (1.829 m), weight 83.9 kg, SpO2 98 %. Physical Exam The patient is alert and oriented in no acute distress. The patient complains of pain in the affected upper extremity.  The patient is noted to have a normal HEENT exam. Lung fields show equal chest expansion and no shortness of breath.  Abdomen exam is nontender without distention. Lower extremity examination does not show any fracture dislocation or blood clot symptoms. Pelvis is stable and the neck and back are stable and nontender.  Soft tissue swelling ecchymosis and displaced left index finger and middle finger metacarpal fractures.  The index finger will require definitive operative stabilization the middle finger will be evaluated with close reduction possible repair as necessary. Assessment/Plan Will plan for open reduction internal fixation left index finger metacarpal fracture with repair as necessary and closure reduction third metacarpal fracture is necessary. We are  planning surgery for your upper extremity. The risk and benefits of surgery to include risk of bleeding, infection, anesthesia,  damage to normal structures and failure of the surgery to accomplish its intended goals of relieving symptoms and restoring function have been discussed in detail. With this in mind we plan to proceed. I have specifically discussed with the patient the pre-and postoperative regime and the dos and don'ts and risk and benefits in great detail. Risk and benefits of surgery also include risk of dystrophy(CRPS), chronic nerve pain, failure of the healing process to go onto completion and other inherent risks of surgery The relavent the pathophysiology of the disease/injury process, as well as the alternatives for treatment and postoperative course of action has been discussed in great detail with the patient who desires to proceed.  We will do everything in our power to help you (the patient) restore function to the upper extremity. It is a pleasure to see this patient today.    Oletta Cohn III, MD 01/25/2021, 4:04 PM

## 2021-01-25 NOTE — Op Note (Signed)
Operative note January 25, 2021  Dominica Severin MD  Preoperative diagnosis left second metacarpal fracture and left third metacarpal fracture at the base  Postop diagnosis the same  Procedure #1 open reduction internal fixation with 65 mm 4.5 Exomed IM nail through an open approach second metacarpal/index metacarpal #2 closure reduction third metacarpal fracture left hand #3 stress radiography left hand  Surgeon Dominica Severin  Anesthesia block with IV sedation  Tourniquet time less than an hour  Estimate blood loss minimal  Description of procedure: Patient was taken to the operative theater underwent a smooth induction of IV sedation.  Preoperative block was in excellent working fashion.  Following this patient underwent Hibiclens scrub x2 followed by 10-minute surgical Betadine scrub.  Following this the patient underwent a very careful and cautious approach to the second metacarpal.  I performed a fluoroscopy examination.  Following this I entered with a small stab incision over the second MCP the Kirschner wire for the Xomed IM nail.  The wire was passed and I felt that a open approach would also be quite helpful given the comminuted nature.  Thus I made a limited open approach dissected down to the fracture site held the fracture and then passed the guidewire over the fracture followed by reaming and placement of a 65 mm 4.5 nail.  The patient had excellent coaptation.  Alignment of the fingers look good.  There were no complications.  This was an ORIF with intramedullary rod second metacarpal left hand.  Irrigation and closure of the periosteal surfaces was accomplished with Vicryl.  Skin edges closed with Prolene.  Patient tolerated this well.  I checked displayed multiple times and I did perform a gentle closed reduction of the base of the third metacarpal.  This was inherently stable and I did not feel fixation would be necessary.  We then placed him in a splint and took him to the  recovery room.  The patient tolerated procedure well there were no immediate complications.  All sponge needle and instrument counts were reported as correct.  We will monitor him in the recovery room and DC him home.  I will see him back in 2 weeks for suture removal and cast.  At 4 weeks we will begin aggressive motion.  Addie Cederberg MD

## 2021-01-25 NOTE — Anesthesia Postprocedure Evaluation (Signed)
Anesthesia Post Note  Patient: Monte Zinni  Procedure(s) Performed: Left index finger open reduction internal fixation metacarpal fracture and manipulation under anesthesia middle finger base fracture (Left: Finger)     Patient location during evaluation: PACU Anesthesia Type: MAC and Regional Level of consciousness: awake and alert Pain management: pain level controlled Vital Signs Assessment: post-procedure vital signs reviewed and stable Respiratory status: spontaneous breathing, nonlabored ventilation, respiratory function stable and patient connected to nasal cannula oxygen Cardiovascular status: stable and blood pressure returned to baseline Postop Assessment: no apparent nausea or vomiting Anesthetic complications: no   No notable events documented.  Last Vitals:  Vitals:   01/25/21 1920 01/25/21 1935  BP: 134/86 (!) 153/79  Pulse: (!) 52 (!) 47  Resp: 20 14  Temp: (!) 36.1 C   SpO2: 98% 98%    Last Pain:  Vitals:   01/25/21 1920  TempSrc:   PainSc: 0-No pain                 Anahlia Iseminger

## 2021-01-25 NOTE — Discharge Instructions (Signed)
You may move your fingers within the confines of the splint.  Your operation went very well.  If you have any emergencies please feel free to call Dr. Amanda Pea on his cell phone at [phone]  Your medicines have been called into your pharmacy through our office.  Keep bandage clean and dry.  Call for any problems.  No smoking.  Criteria for driving a car: you should be off your pain medicine for 7-8 hours, able to drive one handed(confident), thinking clearly and feeling able in your judgement to drive. Continue elevation as it will decrease swelling.  If instructed by MD move your fingers within the confines of the bandage/splint.  Use ice if instructed by your MD. Call immediately for any sudden loss of feeling in your hand/arm or change in functional abilities of the extremity. We recommend that you to take vitamin C 1000 mg a day to promote healing. We also recommend that if you require  pain medicine that you take a stool softener to prevent constipation as most pain medicines will have constipation side effects. We recommend either Peri-Colace or Senokot and recommend that you also consider adding MiraLAX as well to prevent the constipation affects from pain medicine if you are required to use them. These medicines are over the counter and may be purchased at a local pharmacy. A cup of yogurt and a probiotic can also be helpful during the recovery process as the medicines can disrupt your intestinal environment.

## 2021-01-27 ENCOUNTER — Encounter (HOSPITAL_COMMUNITY): Payer: Self-pay | Admitting: Orthopedic Surgery

## 2021-03-12 ENCOUNTER — Other Ambulatory Visit: Payer: Self-pay | Admitting: Interventional Cardiology

## 2021-03-12 DIAGNOSIS — E785 Hyperlipidemia, unspecified: Secondary | ICD-10-CM

## 2021-04-13 ENCOUNTER — Encounter: Payer: Self-pay | Admitting: Internal Medicine

## 2021-04-13 ENCOUNTER — Ambulatory Visit (INDEPENDENT_AMBULATORY_CARE_PROVIDER_SITE_OTHER)
Admission: RE | Admit: 2021-04-13 | Discharge: 2021-04-13 | Disposition: A | Discharge status: Home / Self Care | Payer: 59 | Source: Ambulatory Visit | Attending: Internal Medicine | Admitting: Internal Medicine

## 2021-04-13 ENCOUNTER — Other Ambulatory Visit: Payer: Self-pay

## 2021-04-13 ENCOUNTER — Ambulatory Visit (INDEPENDENT_AMBULATORY_CARE_PROVIDER_SITE_OTHER): Payer: 59 | Admitting: Internal Medicine

## 2021-04-13 VITALS — BP 130/72 | HR 66 | Temp 98.2°F | Ht 72.0 in | Wt 187.8 lb

## 2021-04-13 DIAGNOSIS — J069 Acute upper respiratory infection, unspecified: Secondary | ICD-10-CM | POA: Diagnosis not present

## 2021-04-13 DIAGNOSIS — J9801 Acute bronchospasm: Secondary | ICD-10-CM | POA: Diagnosis not present

## 2021-04-13 MED ORDER — LEVOFLOXACIN 500 MG PO TABS: 500.0000 mg | ORAL_TABLET | Freq: Every day | ORAL | 0 refills | Status: DC

## 2021-04-13 MED ORDER — HYDROCODONE BIT-HOMATROP MBR 5-1.5 MG/5ML PO SOLN: 5.0000 mL | Freq: Three times a day (TID) | ORAL | 0 refills | Status: DC | PRN

## 2021-04-13 MED ORDER — ALBUTEROL SULFATE HFA 108 (90 BASE) MCG/ACT IN AERS: 2.0000 | INHALATION_SPRAY | Freq: Four times a day (QID) | RESPIRATORY_TRACT | 6 refills | Status: DC | PRN

## 2021-04-13 NOTE — Progress Notes (Signed)
Subjective:  Patient ID: Cody Barnes, male    DOB: Jan 13, 1961  Age: 60 y.o. MRN: [id number]  CC: Cough (Pt states he stared coughing couple days ago. Have phlegm in his chest but not able to bring up. Denies fever, and SOB sxs)   HPI Cody Barnes presents for URI x 3 days C/o bad cough.Unable to sleep at night.  COVID test was negative  Outpatient Medications Prior to Visit  Medication Sig Dispense Refill   atorvastatin (LIPITOR) 20 MG tablet Take 1 tablet (20 mg total) by mouth daily. 90 tablet 2   fexofenadine (ALLEGRA) 180 MG tablet Take 180 mg by mouth daily as needed for allergies or rhinitis.     Multiple Vitamins-Minerals (ADULT GUMMY) CHEW Chew 2 capsules by mouth daily.     No facility-administered medications prior to visit.    ROS: Review of Systems  Constitutional:  Positive for chills and fatigue. Negative for appetite change and unexpected weight change.  HENT:  Positive for congestion. Negative for nosebleeds, sneezing, sore throat and trouble swallowing.   Eyes:  Negative for itching and visual disturbance.  Respiratory:  Positive for cough, chest tightness and wheezing.   Cardiovascular:  Negative for chest pain, palpitations and leg swelling.  Gastrointestinal:  Negative for abdominal distention, blood in stool, diarrhea and nausea.  Genitourinary:  Negative for frequency and hematuria.  Musculoskeletal:  Negative for back pain, gait problem, joint swelling and neck pain.  Skin:  Negative for rash.  Neurological:  Negative for dizziness, tremors, speech difficulty and weakness.  Psychiatric/Behavioral:  Negative for agitation, dysphoric mood and sleep disturbance. The patient is not nervous/anxious.    Objective:  BP 130/72 (BP Location: Left Arm)   Pulse 66   Temp 98.2 F (36.8 C) (Oral)   Ht 6' (1.829 m)   Wt 187 lb 12.8 oz (85.2 kg)   SpO2 95%   BMI 25.47 kg/m   BP Readings from Last 3 Encounters:  04/13/21 130/72  01/25/21 (!) 153/89   01/22/21 (!) 144/79    Wt Readings from Last 3 Encounters:  04/13/21 187 lb 12.8 oz (85.2 kg)  01/25/21 185 lb (83.9 kg)  01/22/21 185 lb (83.9 kg)    Physical Exam Constitutional:      General: He is not in acute distress.    Appearance: He is well-developed.     Comments: NAD  Eyes:     Conjunctiva/sclera: Conjunctivae normal.     Pupils: Pupils are equal, round, and reactive to light.  Neck:     Thyroid: No thyromegaly.     Vascular: No JVD.  Cardiovascular:     Rate and Rhythm: Normal rate and regular rhythm.     Heart sounds: Normal heart sounds. No murmur heard.   No friction rub. No gallop.  Pulmonary:     Effort: Pulmonary effort is normal. No respiratory distress.     Breath sounds: Rhonchi present. No wheezing or rales.  Chest:     Chest wall: No tenderness.  Abdominal:     General: Bowel sounds are normal. There is no distension.     Palpations: Abdomen is soft. There is no mass.     Tenderness: There is no abdominal tenderness. There is no guarding or rebound.  Musculoskeletal:        General: No tenderness. Normal range of motion.     Cervical back: Normal range of motion.  Lymphadenopathy:     Cervical: No cervical adenopathy.  Skin:    General:  Skin is warm and dry.     Findings: No rash.  Neurological:     Mental Status: He is alert and oriented to person, place, and time.     Cranial Nerves: No cranial nerve deficit.     Motor: No abnormal muscle tone.     Coordination: Coordination normal.     Gait: Gait normal.     Deep Tendon Reflexes: Reflexes are normal and symmetric.  Psychiatric:        Behavior: Behavior normal.        Thought Content: Thought content normal.        Judgment: Judgment normal.    Lab Results  Component Value Date   WBC 6.8 01/25/2021   HGB 15.3 01/25/2021   HCT 46.2 01/25/2021   PLT 194 01/25/2021   GLUCOSE 94 11/11/2020   CHOL 171 11/11/2020   TRIG 93.0 11/11/2020   HDL 48.20 11/11/2020   LDLCALC 104 (H)  11/11/2020   ALT 30 11/11/2020   AST 19 11/11/2020   NA 138 12/04/2020   K 5.0 12/04/2020   CL 106 12/04/2020   CREATININE 1.1 12/04/2020   BUN 18 12/04/2020   CO2 23 (A) 12/04/2020   TSH 1.68 11/11/2020   PSA 1.71 11/11/2020   INR 1.0 12/10/2013   HGBA1C 5.3 12/28/2015    DG MINI C-ARM IMAGE ONLY  Result Date: 01/25/2021 There is no interpretation for this exam.  This order is for images obtained during a surgical procedure.  Please See "Surgeries" Tab for more information regarding the procedure.    Assessment & Plan:   Problem List Items Addressed This Visit     Bronchospasm    New.  Likely due to bronchitis or bronchopneumonia.  Prescribed HFA ProAir inhaler to use 2 puffs every 4 hours as needed.      Upper respiratory tract infection - Primary    Possible bronchial pneumonia.  New.  Obtain chest x-ray.  Start Levaquin for 10 days.  Hydrocodone cough syrup prescribed      Relevant Orders   DG Chest 2 View (Completed)      Meds ordered this encounter  Medications   levofloxacin (LEVAQUIN) 500 MG tablet    Sig: Take 1 tablet (500 mg total) by mouth daily.    Dispense:  10 tablet    Refill:  0   HYDROcodone bit-homatropine (HYCODAN) 5-1.5 MG/5ML syrup    Sig: Take 5 mLs by mouth every 8 (eight) hours as needed for cough.    Dispense:  240 mL    Refill:  0   albuterol (PROAIR HFA) 108 (90 Base) MCG/ACT inhaler    Sig: Inhale 2 puffs into the lungs every 6 (six) hours as needed for wheezing or shortness of breath.    Dispense:  1 each    Refill:  6      Follow-up: Return in about 2 weeks (around 04/27/2021) for a follow-up visit.  Sonda Primes, MD

## 2021-04-17 DIAGNOSIS — J9801 Acute bronchospasm: Secondary | ICD-10-CM | POA: Insufficient documentation

## 2021-04-17 NOTE — Assessment & Plan Note (Signed)
New.  Likely due to bronchitis or bronchopneumonia.  Prescribed HFA ProAir inhaler to use 2 puffs every 4 hours as needed.

## 2021-04-17 NOTE — Assessment & Plan Note (Signed)
Possible bronchial pneumonia.  New.  Obtain chest x-ray.  Start Levaquin for 10 days.  Hydrocodone cough syrup prescribed

## 2021-07-29 ENCOUNTER — Ambulatory Visit (INDEPENDENT_AMBULATORY_CARE_PROVIDER_SITE_OTHER): Payer: 59

## 2021-07-29 ENCOUNTER — Other Ambulatory Visit: Payer: Self-pay

## 2021-07-29 ENCOUNTER — Ambulatory Visit (INDEPENDENT_AMBULATORY_CARE_PROVIDER_SITE_OTHER): Payer: 59 | Admitting: Internal Medicine

## 2021-07-29 ENCOUNTER — Encounter: Payer: Self-pay | Admitting: Internal Medicine

## 2021-07-29 VITALS — BP 130/80 | HR 70 | Temp 98.4°F | Ht 72.0 in | Wt 195.8 lb

## 2021-07-29 DIAGNOSIS — M76821 Posterior tibial tendinitis, right leg: Secondary | ICD-10-CM

## 2021-07-29 MED ORDER — PREDNISONE 20 MG PO TABS: 40.0000 mg | ORAL_TABLET | Freq: Every day | ORAL | 0 refills | Status: DC

## 2021-07-29 NOTE — Patient Instructions (Addendum)
? ? ? ? ?Medications changes include :   prednisone 40 mg daily with food ? ? ?Your prescription(s) have been sent to your pharmacy.  ? ? ?A referral was ordered for podiatry     Someone from that office will call you to schedule an appointment.  ? ? ?Posterior Tibial Tendinitis ?Posterior tibial tendinitis is irritation of a tendon called the posterior tibial tendon. Your posterior tibial tendon is a cord-like tissue that connects bones of your lower leg and foot to a muscle that: ?Supports your arch. ?Helps you raise up on your toes. ?Helps you turn your foot down and in. ?This condition causes foot and ankle pain. It can also lead to a flat foot. ?What are the causes? ?This condition is most often caused by repeated stress to the tendon (overuse injury). It can also be caused by a sudden injury that stresses the tendon, such as landing on your foot after jumping or falling. ?What increases the risk? ?This condition is more likely to develop in: ?People who play a sport that involves putting a lot of pressure on the feet, such as: ?Basketball. ?Tennis. ?Soccer. ?Hockey. ?Runners. ?Females who are older than 62 years of age and are overweight. ?People with diabetes. ?People with decreased foot stability. ?People with flat feet. ?What are the signs or symptoms? ?Symptoms include: ?Pain in the inner ankle. ?Pain at the arch of your foot. ?Pain that gets worse with running, walking, or standing. ?Swelling on the inside of your ankle and foot. ?Weakness in your ankle or foot. ?Inability to stand up on tiptoe. ?Flattening of the arch of your foot. ?How is this diagnosed? ?This condition may be diagnosed based on: ?Your symptoms. ?Your medical history. ?A physical exam. ?Tests, such as: ?X-ray. ?MRI. ?Ultrasound. ?How is this treated? ?This condition may be treated by: ?Putting ice to the injured area. ?Taking NSAIDs, such as ibuprofen, to reduce pain and swelling. ?Wearing a special shoe or shoe insert to support your  arch (orthotic). ?Having physical therapy. ?Replacing high-impact exercise with low-impact exercise, such as swimming or cycling. ?If your symptoms do not improve with these treatments, you may need to wear a splint, removable walking boot, or short leg cast for 6-8 weeks to keep your foot and ankle still (immobilized). ?Follow these instructions at home: ?If you have a cast, splint, or boot: ?Keep it clean and dry. ?Check the skin around it every day. Tell your health care provider about any concerns. ?If you have a cast: ?Do not stick anything inside it to scratch your skin. Doing that increases your risk of infection. ?You may put lotion on dry skin around the edges of the cast. Do not put lotion on the skin underneath the cast. ?If you have a splint or boot: ?Wear it as told by your health care provider. Remove it only as told by your health care provider. ?Loosen it if your toes tingle, become numb, or turn cold and blue. ?Bathing ?Do not take baths, swim, or use a hot tub until your health care provider approves. Ask your health care provider if you may take showers. ?If your cast, splint, or boot is not waterproof: ?Do not let it get wet. ?Cover it with a waterproof covering while you take a bath or a shower. ?Managing pain and swelling ? ?If directed, put ice on the injured area. ?If you have a removable splint or boot, remove it as told by your health care provider. ?Put ice in a plastic bag. ?  Place a towel between your skin and the bag or between your cast and the bag. ?Leave the ice on for 20 minutes, 2-3 times a day. ?Move your toes often to reduce stiffness and swelling. ?Raise (elevate) the injured area above the level of your heart while you are sitting or lying down. ?Activity ?Do not use the injured foot to support your body weight until your health care provider says that you can. Use crutches as told by your health care provider. ?Do not do activities that make pain or swelling worse. ?Ask your  health care provider when it is safe to drive if you have a cast, splint, or boot on your foot. ?Return to your normal activities as told by your health care provider. Ask your health care provider what activities are safe for you. ?Do exercises as told by your health care provider. ?General instructions ?Take over-the-counter and prescription medicines only as told by your health care provider. ?If you have an orthotic, use it as told by your health care provider. ?Keep all follow-up visits as told by your health care provider. This is important. ?How is this prevented? ?Wear footwear that is appropriate to your athletic activity. ?Avoid athletic activities that cause pain or swelling in your ankle or foot. ?Before being active, do range-of-motion and stretching exercises. ?If you develop pain or swelling while training, stop training. ?If you have pain or swelling that does not improve after a few days of rest, see your health care provider. ?If you start a new athletic activity, start gradually so you can build up your strength and flexibility. ?Contact a health care provider if: ?Your symptoms get worse. ?Your symptoms do not improve in 6-8 weeks. ?You develop new, unexplained symptoms. ?Your splint, boot, or cast gets damaged. ?Summary ?Posterior tibial tendinitis is irritation of a tendon called the posterior tibial tendon. ?This condition is most often caused by repeated stress to the tendon (overuse injury). ?This condition causes foot pain and ankle pain. It can also lead to a flat foot. ?This condition may be treated by not doing high-impact activities, applying ice, having physical therapy, wearing orthotics, and wearing a cast, splint, or boot if needed. ?This information is not intended to replace advice given to you by your health care provider. Make sure you discuss any questions you have with your health care provider. ?Document Revised: 09/10/2018 Document Reviewed: 07/18/2018 ?Elsevier Patient  Education ? [place]. ? ?

## 2021-07-29 NOTE — Progress Notes (Signed)
? ? ?Subjective:  ? ? Patient ID: Cody Barnes, male    DOB: 04-21-1961, 61 y.o.   MRN: [id number] ? ?This visit occurred during the SARS-CoV-2 public health emergency.  Safety protocols were in place, including screening questions prior to the visit, additional usage of staff PPE, and extensive cleaning of exam room while observing appropriate contact time as indicated for disinfecting solutions. ? ? ? ?HPI ?Cody Barnes is here for  ?Chief Complaint  ?Patient presents with  ? Foot Pain  ?  Right foot pain with swelling  ? ? ?He is experiencing right medial ankle pain and swelling.  It started to weeks ago.  Started when he was walking .  Got worse the more he walked. No injuries.  No change in footwear.  He denies any significant increase in walking intensity.  This past week it started to swell.  No pain at rest.  He denies redness, bruising.  No N/T.  No fevers.  Has not taken anything for it.  ? ?Sometimes he walks at lunch at work and when he does get back to the office he will have pain in the lower leg.  Denies any history of gout ? ? ?Medications and allergies reviewed with patient and updated if appropriate. ? ?Current Outpatient Medications on File Prior to Visit  ?Medication Sig Dispense Refill  ? albuterol (PROAIR HFA) 108 (90 Base) MCG/ACT inhaler Inhale 2 puffs into the lungs every 6 (six) hours as needed for wheezing or shortness of breath. 1 each 6  ? atorvastatin (LIPITOR) 20 MG tablet Take 1 tablet (20 mg total) by mouth daily. 90 tablet 2  ? fexofenadine (ALLEGRA) 180 MG tablet Take 180 mg by mouth daily as needed for allergies or rhinitis.    ? HYDROcodone bit-homatropine (HYCODAN) 5-1.5 MG/5ML syrup Take 5 mLs by mouth every 8 (eight) hours as needed for cough. 240 mL 0  ? levofloxacin (LEVAQUIN) 500 MG tablet Take 1 tablet (500 mg total) by mouth daily. 10 tablet 0  ? Multiple Vitamins-Minerals (ADULT GUMMY) CHEW Chew 2 capsules by mouth daily.    ? ?No current facility-administered medications on  file prior to visit.  ? ? ?Review of Systems ? ?   ?Objective:  ? ?Vitals:  ? 07/29/21 0806  ?BP: 130/80  ?Pulse: 70  ?Temp: 98.4 ?F (36.9 ?C)  ?SpO2: 97%  ? ?BP Readings from Last 3 Encounters:  ?07/29/21 130/80  ?04/13/21 130/72  ?01/25/21 (!) 153/89  ? ?Wt Readings from Last 3 Encounters:  ?07/29/21 195 lb 12.8 oz (88.8 kg)  ?04/13/21 187 lb 12.8 oz (85.2 kg)  ?01/25/21 185 lb (83.9 kg)  ? ?Body mass index is 26.56 kg/m?. ? ?  ?Physical Exam ?Constitutional:   ?   General: He is not in acute distress. ?   Appearance: Normal appearance.  ?HENT:  ?   Head: Normocephalic.  ?Musculoskeletal:  ?   Comments: Swelling right medial ankle around malleolus with tenderness in the surrounding area, but primarily in the posterior malleolus region.  No calf tenderness or swelling.  He states sometimes the pain does radiate up into the calf.  Full range of motion of ankle, toes.  Right foot neurovascularly intact  ?Skin: ?   General: Skin is warm and dry.  ?   Findings: No bruising, erythema, lesion or rash.  ?Neurological:  ?   Mental Status: He is alert.  ? ?   ? ? ? ? ? ?Assessment & Plan:  ? ? ?Right posterior  tibial tendinitis ?Acute ?Symptoms and exam consistent with tendinitis of the right posterior tibial tendon ?We will get an x-ray to rule out stress fracture or other injuries ?Prednisone 40 mg daily x5 days ?Can ice ?Advise decrease walking, elevation ?Will refer to podiatry for further evaluation and treatment ? ? ?I did review the x-ray there is some soft tissue swelling in that area of the medial malleolus, but no obvious bone fracture, dislocation or other injury.  Read by radiology pending ? ?

## 2021-08-04 ENCOUNTER — Other Ambulatory Visit: Payer: Self-pay

## 2021-08-04 ENCOUNTER — Ambulatory Visit: Payer: 59

## 2021-08-04 ENCOUNTER — Encounter: Payer: Self-pay | Admitting: Podiatry

## 2021-08-04 ENCOUNTER — Ambulatory Visit: Payer: 59 | Admitting: Podiatry

## 2021-08-04 DIAGNOSIS — M66871 Spontaneous rupture of other tendons, right ankle and foot: Secondary | ICD-10-CM | POA: Diagnosis not present

## 2021-08-04 NOTE — Progress Notes (Signed)
?Subjective:  ?Patient ID: Cody Barnes, male    DOB: [birth year],  MRN: [id number] ?HPI ?Chief Complaint  ?Patient presents with  ? Ankle Pain  ?  Medial ankle right - aching x 2 weeks, swelling started last week, no injury, active with walking his dog, PCP xrayed - Rx'd prednisone-no help  ? New Patient (Initial Visit)  ? ? ?61 y.o. male presents with the above complaint.  ? ?ROS: Denies fever chills nausea vomit muscle aches pains calf pain back pain chest pain shortness of breath denies any injury. ? ?Past Medical History:  ?Diagnosis Date  ? Aortic insufficiency   ? Bell's palsy   ? Transient  ? Elevated PSA 10/12/2014  ? Family history of breast cancer   ? H/O mitral valve repair 12/16/2015  ? Heart murmur   ? Mitral regurgitation   ? Mitral valve prolapse   ? 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 ?Past Surgical History:  ?Procedure Laterality Date  ? AORTIC VALVE REPLACEMENT    ? HIP FRACTURE SURGERY    ? left  ? INGUINAL HERNIA REPAIR    ? as infant  ? LEFT AND RIGHT HEART CATHETERIZATION WITH CORONARY ANGIOGRAM N/A 12/12/2013  ? Procedure: LEFT AND RIGHT HEART CATHETERIZATION WITH CORONARY ANGIOGRAM;  Surgeon: Corky Crafts, MD;  Location: [facility] CATH LAB;  Service: Cardiovascular;  Laterality: N/A;  ? MITRAL VALVE REPLACEMENT    ? OPEN REDUCTION INTERNAL FIXATION (ORIF) METACARPAL Left 01/25/2021  ? Procedure: Left index finger open reduction internal fixation metacarpal fracture and manipulation under anesthesia middle finger base fracture;  Surgeon: Dominica Severin, MD;  Location: [facility] OR;  Service: Orthopedics;  Laterality: Left;  1hr ?Block with IV Sedation  ? TEE WITHOUT CARDIOVERSION N/A 11/25/2013  ? Procedure: TRANSESOPHAGEAL ECHOCARDIOGRAM (TEE);  Surgeon: Quintella Reichert, MD;  Location: [facility] ENDOSCOPY;  Service: Cardiovascular;  Laterality: N/A;  ? ? ?Current Outpatient Medications:  ?  atorvastatin (LIPITOR) 20 MG tablet, Take 1 tablet (20 mg total) by mouth daily.,  Disp: 90 tablet, Rfl: 2 ?  fexofenadine (ALLEGRA) 180 MG tablet, Take 180 mg by mouth daily as needed for allergies or rhinitis., Disp: , Rfl:  ?  Multiple Vitamins-Minerals (ADULT GUMMY) CHEW, Chew 2 capsules by mouth daily., Disp: , Rfl:  ? ?No Known Allergies ?Review of Systems ?Objective:  ?There were no vitals filed for this visit. ? ?General: Well developed, nourished, in no acute distress, alert and oriented x3  ? ?Dermatological: Skin is warm, dry and supple bilateral. Nails x 10 are well maintained; remaining integument appears unremarkable at this time. There are no open sores, no preulcerative lesions, no rash or signs of infection present. ? ?Vascular: Dorsalis Pedis artery and Posterior Tibial artery pedal pulses are 2/4 bilateral with immedate capillary fill time. Pedal hair growth present. No varicosities and no lower extremity edema present bilateral.  ? ?Neruologic: Grossly intact via light touch bilateral. Vibratory intact via tuning fork bilateral. Protective threshold with Semmes Wienstein monofilament intact to all pedal sites bilateral. Patellar and Achilles deep tendon reflexes 2+ bilateral. No Babinski or clonus noted bilateral.  ? ?Musculoskeletal: No gross boney pedal deformities bilateral. No pain, crepitus, or limitation noted with foot and ankle range of motion bilateral. Muscular strength 5/5 in all groups tested bilateral..  Swelling of the medial ankle is much more appreciable today than on radiographs taken last week he has pain on inversion against resistance he has pain on direct palpation of the posterior tibial  tendon he also has pain on plantarflexion of the hallux posteriorly.  Negative Tinel's sign. ? ?Gait: Unassisted, Nonantalgic.  ? ? ?Radiographs: ? ?Radiographs taken previously primary care were reviewed today noting only a tight first metatarsal phalangeal joint he did have some soft tissue swelling medially ? ?Assessment & Plan:  ? ?Assessment: Probable spontaneous tear  of the posterior tibial tendon right foot.  Moderate edema.  Mild capsulitis first metatarsal phalangeal joint. ? ?Plan: Due to failure of conservative therapies including steroid oral anti-inflammatories rest ice compression and elevation and since it just seems to be getting worse with each passing day we will request MRI of the rear foot and ankle for evaluation of posterior tibial tendon tear and any other abnormalities not visible on traditional radiography.  This will be for surgical consideration and differential diagnosis. ? ?Placed in a compression anklet and a cam walker to protect the foot from further injury and to decompress the edema. ? ? ? ? ?Vitaliy Eisenhour T. [place], DPM ?

## 2021-08-05 ENCOUNTER — Ambulatory Visit: Payer: Self-pay | Admitting: Podiatry

## 2021-08-08 ENCOUNTER — Ambulatory Visit: Payer: Self-pay | Admitting: Podiatry

## 2021-08-08 ENCOUNTER — Telehealth: Payer: Self-pay | Admitting: Podiatry

## 2021-08-08 NOTE — Telephone Encounter (Signed)
Called [facility](Dee) and she said that was the (08/13/21)earliest date available and they did squeezed him in for that date, nothing else until the end of March. ?

## 2021-08-08 NOTE — Telephone Encounter (Signed)
Please advise 

## 2021-08-08 NOTE — Telephone Encounter (Signed)
Dr. Al Corpus said that date should be fine ?

## 2021-08-08 NOTE — Telephone Encounter (Signed)
Pt called and waiting on mri to be scheduled.Cody Barnes ?Pts wife states that his foot has gotten worse and swelling more. Could we expedite the mri please. ? ?Notify pt ?

## 2021-08-08 NOTE — Telephone Encounter (Signed)
Pts wife called and the next available for the mri is 3.18 and they were told by [facility] last week the mri needed to be within 3 to 4 days from last Thursday. Can you see if they can get in sooner for the mri.  ? ?Pts wife states that the pain is bad and she does not think he will be able to make it to Saturday for the mri. ?

## 2021-08-08 NOTE — Telephone Encounter (Signed)
Pts wife called back again and I did explain that Cody Barnes is in clinic and will get back to her asap. ? But they are trying to get pts mri sooner than 3.18.2023 due to pts pain is getting worse. ?

## 2021-08-09 NOTE — Telephone Encounter (Signed)
Pts wife called about the mri.  ? ?I seen the notes that Ammie called and they were not able to move it to sooner. I also notified pt that Dr Milinda Pointer said the date was ok.. ? She asked if we could move it to another location that could possibly do sooner and I explained if we have gotten auth from insurance it can sometimes be difficult to change locations.  ?I did tell her to call the facility to see if they have a wait list and she said she did that and they do not but advised her she could call to see.  ? ?But thanked me and the office for all the work we have done. ?

## 2021-08-10 ENCOUNTER — Telehealth: Payer: Self-pay | Admitting: Podiatry

## 2021-08-10 NOTE — Telephone Encounter (Signed)
Pts wife  called and they got pt in tomorrow 3.16.2023 @ 630am for the mri... She is very thankful for our office. ?

## 2021-08-11 ENCOUNTER — Ambulatory Visit
Admission: RE | Admit: 2021-08-11 | Discharge: 2021-08-11 | Disposition: A | Discharge status: Home / Self Care | Payer: 59 | Source: Ambulatory Visit | Attending: Podiatry | Admitting: Podiatry

## 2021-08-11 ENCOUNTER — Telehealth: Payer: Self-pay | Admitting: Podiatry

## 2021-08-11 ENCOUNTER — Other Ambulatory Visit: Payer: Self-pay

## 2021-08-11 NOTE — Telephone Encounter (Signed)
We don't have the results back yet, but if he wants to come to [facility], March 22nd, you can put him in a NP slot. ?

## 2021-08-11 NOTE — Telephone Encounter (Signed)
Patient would like to go over MRI results asap being that his foot is in a lot of pain. No avail times next week. ? ?P{lease advise ?

## 2021-08-12 NOTE — Telephone Encounter (Addendum)
Patient wife wants a doctor to call them today to go over the MRI  results.  She does not want wait until they come in for the appointment.  She did set up appt for 08/18/21, but she wants to discuss what they seen in Mychart .

## 2021-08-12 NOTE — Telephone Encounter (Signed)
Pts wife called this morning and wants to go over the MRI results. Tried to read them in my chart but was not able to understand the verbiage. She is requesting to speak to [facility]  prior to making an appointment first if possible but she would like her to call the pt and speak with him about results. ? ?Please advise. ?

## 2021-08-13 ENCOUNTER — Other Ambulatory Visit: Payer: 59

## 2021-08-18 ENCOUNTER — Ambulatory Visit: Payer: 59 | Admitting: Podiatry

## 2021-08-18 ENCOUNTER — Encounter: Payer: Self-pay | Admitting: Podiatry

## 2021-08-18 ENCOUNTER — Other Ambulatory Visit: Payer: Self-pay

## 2021-08-18 DIAGNOSIS — I872 Venous insufficiency (chronic) (peripheral): Secondary | ICD-10-CM | POA: Diagnosis not present

## 2021-08-18 DIAGNOSIS — R0989 Other specified symptoms and signs involving the circulatory and respiratory systems: Secondary | ICD-10-CM | POA: Diagnosis not present

## 2021-08-18 NOTE — Progress Notes (Signed)
He presents today for follow-up of his MRI for his right foot.  He states that in the mornings he gets up and his foot hardly hurts him at all until he becomes more active throughout the day and notices the swelling.  He states that the entire foot is swelling not just the one side or the other side.  He denies any calf pain chest pain shortness of breath. ? ?Objective: Vital signs are stable he is alert and oriented x3.  Pulses are palpable right no calf pain right.  He does have pitting edema from the ankle to the toes.  He still has tenderness of the posterior tibial tendon and tenderness overlying the navicular tuberosity.  He has no tenderness on palpation of the peroneals. ? 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  The posterior tibial tendon is mildly tender with diagnosis of tenosynovitis from radiology. ? ?Assessment: We cannot rule out the swelling coming from medial and lateral foot tendinopathy's.  However I would like to evaluate him for venous insufficiency since there is a mild area of hyperpigmentation to the medial ankle. ? ?Plan: Requesting ABIs venous insufficiency studies and Dopplers of the right lower extremity for evaluation.  Should this come back abnormal we will notify him immediately.  Hopefully vascular will be able to take care of that.  I did put him in extra-large compression anklet's today and we are going to request an over read of the MRI to rule out any inaccuracies. ?

## 2021-08-19 ENCOUNTER — Telehealth: Payer: Self-pay | Admitting: Podiatry

## 2021-08-19 ENCOUNTER — Telehealth: Payer: Self-pay | Admitting: *Deleted

## 2021-08-19 NOTE — Telephone Encounter (Signed)
Pts wife called asking if we could get the ABI testing expedited as pt has a history of heart surgery in 2015 and she is wanting to make sure his condition is not being caused by the heart.  ?Please advise if we can get this expedited.  ?

## 2021-08-19 NOTE — Telephone Encounter (Signed)
Para March w/ vascular and vein is requesting a prior authorization for patient, [id number] & [id number], cpt codes, please contact  at [phone] when completed so he may schedule. ?

## 2021-08-22 NOTE — Telephone Encounter (Signed)
Please send prior to [facility] @ Vascular and Vein

## 2021-08-22 NOTE — Telephone Encounter (Signed)
Pts wife called checking on the appt with vein and vascular. Upon checking chart I see that it will need prior authorization and I explained that to the pts wife. ?

## 2021-08-23 NOTE — Telephone Encounter (Signed)
Mrs Cody Barnes  called [employer] and they said they have not received a call from  our office for authorization for Cody Barnes ABI - they told her once we call them they will put a rush on the authorization and we will receive it in 24 hrs    [employer] [phone]

## 2021-08-23 NOTE — Telephone Encounter (Signed)
Studies [id number], per insurance does not require a prior authorization,ref# Jomarie Longs R 08/23/21@2 :25pm.  ?Contacted [facility] speaking with Phoebe Sharps to let them know that patient is ready to be scheduled. ?

## 2021-08-26 ENCOUNTER — Ambulatory Visit (HOSPITAL_COMMUNITY)
Admission: RE | Admit: 2021-08-26 | Discharge: 2021-08-26 | Disposition: A | Discharge status: Home / Self Care | Payer: 59 | Source: Ambulatory Visit | Attending: Podiatry | Admitting: Podiatry

## 2021-08-26 ENCOUNTER — Ambulatory Visit (INDEPENDENT_AMBULATORY_CARE_PROVIDER_SITE_OTHER)
Admission: RE | Admit: 2021-08-26 | Discharge: 2021-08-26 | Disposition: A | Discharge status: Home / Self Care | Payer: 59 | Source: Ambulatory Visit | Attending: Podiatry | Admitting: Podiatry

## 2021-08-26 DIAGNOSIS — R0989 Other specified symptoms and signs involving the circulatory and respiratory systems: Secondary | ICD-10-CM

## 2021-08-26 DIAGNOSIS — I872 Venous insufficiency (chronic) (peripheral): Secondary | ICD-10-CM | POA: Insufficient documentation

## 2021-08-29 ENCOUNTER — Encounter: Payer: Self-pay | Admitting: *Deleted

## 2021-08-30 ENCOUNTER — Ambulatory Visit: Payer: 59 | Admitting: Podiatry

## 2021-08-30 ENCOUNTER — Telehealth: Payer: Self-pay | Admitting: *Deleted

## 2021-08-30 DIAGNOSIS — I872 Venous insufficiency (chronic) (peripheral): Secondary | ICD-10-CM

## 2021-08-30 NOTE — Telephone Encounter (Signed)
Referral sent to [facility] for vascular consult. Patient notified via MyChart ?

## 2021-08-30 NOTE — Telephone Encounter (Signed)
Patients wife is calling and is very concerned with trying to get her husband seen for a vascular consult. She states she called them and they are a week or two out and she really wants him seen sooner. She states she cancelled the follow up appt with Dr. Al Corpus thinking that he would be seen this week with vascular. She wants to know is someone from here could call and get an appt scheduled asap with [facility]. She says that he can be ready within one hour if necessary.  ? ?Please advise ?

## 2021-08-30 NOTE — Telephone Encounter (Signed)
Patient wife called back , [facility] is getting patient to be seen tomorrow.  She will follow up with Dr Al Corpus after her husband is seen tomorrow. Thank you for all that you did.

## 2021-08-30 NOTE — Telephone Encounter (Signed)
-----   Message from Garrel Ridgel, [place] sent at 08/30/2021  7:28 AM EDT ----- ?Please make sure he has a follow up appointment for venous insuff. Of the right foot and leg.  Thanks ?

## 2021-08-31 ENCOUNTER — Telehealth: Payer: Self-pay | Admitting: Podiatry

## 2021-08-31 ENCOUNTER — Encounter: Payer: Self-pay | Admitting: Physician Assistant

## 2021-08-31 ENCOUNTER — Ambulatory Visit: Payer: 59 | Admitting: Physician Assistant

## 2021-08-31 VITALS — BP 129/90 | HR 61 | Temp 97.8°F | Ht 72.0 in | Wt 194.0 lb

## 2021-08-31 DIAGNOSIS — M7989 Other specified soft tissue disorders: Secondary | ICD-10-CM

## 2021-08-31 DIAGNOSIS — I872 Venous insufficiency (chronic) (peripheral): Secondary | ICD-10-CM

## 2021-08-31 NOTE — Telephone Encounter (Signed)
Patients wife called and states that she is very frustrated as she still doesn't have an answer to her husbands foot issue. She states they went to the vein specialist and was told that the swelling of his foot is not a vein issue and that its coming from the tendon tear. She states that he is wearing the compression anklet but its not helping with the swelling. She says that she doesn't want to come in to see Dr. Al Corpus unless he has answers. Dr. Geryl Rankins schedule is completely booked for the next few weeks.  ? ?Please advise. ?

## 2021-08-31 NOTE — Telephone Encounter (Signed)
I spoke to patient's wife... I explained to her the reasoning behind the steps that we needed to go through before Cody Barnes could have surgery to fix the tendon tear. He did see the PA for the venous insufficiency today and they did give him a compression garment that extended to his knee. I advised for him to wear that daily and see how it goes. She would like for him to follow up with Dr. Milinda Pointer in 2-4 weeks to re-evaluate, but wanted to talk to Cody Barnes first. She will be in touch with our office. ?

## 2021-08-31 NOTE — Progress Notes (Signed)
? ? ?Requested by:  ?DawsonHyatt, Max T, DPM ?[address] ?[address] ?[place] [id number] ? ?Reason for consultation: venous reflux  ? ? ?History of Present Illness  ? ?Cody Barnes is a 61 y.o. (Jul 13, 1960) male who presents for evaluation of right leg swelling and pain. He explains that approximately one month ago he started having pain on ambulation in his right ankle. He now reports also the pain sometimes at rest and aching at night as well. He says he walks daily with his dogs and started having pain after his walks. Then he started noticing swelling in the right leg and ankle. Both have continued to progress especially over the past week. He initially saw his PCP and then was referred to podiatrist. He had xray as well as MRI performed and her reports that MRI showed a torn ligament in his right ankle. He has been taking Ibuprofen and wearing ankle compression brace. He has been elevating his legs at night but with little improvement in swelling. He has never worn compression stockings before. He says her does not recall any injury to his right ankle/ leg prior to this all starting. He previously was not having any pain on ambulation or rest. He has no history of DVT. No family history of DVT or venous disease to his knowledge.  ? ?Venous symptoms include: aching,swelling ?Onset/duration:  1 month ?Occupation:  Airline pilotAccountant ?Aggravating factors: sitting, standing, ambulation ?Alleviating factors: elevation, minimally ?Compression:  no Helps:  n/a ?Pain medications:  Ibuprofen ?Previous vein procedures:  none ?History of DVT:  none ? ?Past Medical History:  ?Diagnosis Date  ? Aortic insufficiency   ? Bell's palsy   ? Transient  ? Elevated PSA 10/12/2014  ? Family history of breast cancer   ? H/O mitral valve repair 12/16/2015  ? Heart murmur   ? Mitral regurgitation   ? Mitral valve prolapse   ? 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 ? ?Past Surgical History:  ?Procedure  Laterality Date  ? AORTIC VALVE REPLACEMENT    ? HIP FRACTURE SURGERY    ? left  ? INGUINAL HERNIA REPAIR    ? as infant  ? LEFT AND RIGHT HEART CATHETERIZATION WITH CORONARY ANGIOGRAM N/A 12/12/2013  ? Procedure: LEFT AND RIGHT HEART CATHETERIZATION WITH CORONARY ANGIOGRAM;  Surgeon: Corky CraftsJayadeep S Varanasi, MD;  [facility];  Service: Cardiovascular;  Laterality: N/A;  ? MITRAL VALVE REPLACEMENT    ? OPEN REDUCTION INTERNAL FIXATION (ORIF) METACARPAL Left 01/25/2021  ? Procedure: Left index finger open reduction internal fixation metacarpal fracture and manipulation under anesthesia middle finger base fracture;  Surgeon: Dominica SeverinGramig, William, MD;  Location: [facility] OR;  Service: Orthopedics;  Laterality: Left;  1hr ?Block with IV Sedation  ? TEE WITHOUT CARDIOVERSION N/A 11/25/2013  ? Procedure: TRANSESOPHAGEAL ECHOCARDIOGRAM (TEE);  Surgeon: Quintella Reichertraci R Turner, MD;  Location[facility] END[facility];  Service: Cardiovascular;  Laterality: N/A;  ? ? ?Social History  ? ?Socioeconomic History  ? Marital status: Married  ?  Spouse name: Not on file  ? Number of children: Not on file  ? Years of education: Not on file  ? Highest education level: Not on file  ?Occupational History  ? Not on file  ?Tobacco Use  ? Smoking status: Never  ? Smokeless tobacco: Never  ?Substance and Sexual Activity  ? Alcohol use: Yes  ?  Alcohol/week: 6.0 standard drinks  ?  Types: 5 Cans of beer, 1 Shots of liquor  per week  ?  Comment: Last drink Friday 01/21/21  ? Drug use: No  ? Sexual activity: Yes  ?Other Topics Concern  ? Not on file  ?Social History Narrative  ? Not on file  ? ?Social Determinants of Health  ? ?Financial Resource Strain: Not on file  ?Food Insecurity: Not on file  ?Transportation Needs: Not on file  ?Physical Activity: Not on file  ?Stress: Not on file  ?Social Connections: Not on file  ?Intimate Partner Violence: Not on file  ? ? ?Family History  ?Problem Relation Age of Onset  ? Breast cancer Mother 84  ? Liver cancer Mother 20  ? Drug  abuse Neg Hx   ? Early death Neg Hx   ? Heart disease Neg Hx   ? Hyperlipidemia Neg Hx   ? Hypertension Neg Hx   ? Kidney disease Neg Hx   ? Stroke Neg Hx   ? Arthritis Neg Hx   ? ? ?Current Outpatient Medications  ?Medication Sig Dispense Refill  ? atorvastatin (LIPITOR) 20 MG tablet Take 1 tablet (20 mg total) by mouth daily. 90 tablet 2  ? fexofenadine (ALLEGRA) 180 MG tablet Take 180 mg by mouth daily as needed for allergies or rhinitis.    ? Multiple Vitamins-Minerals (ADULT GUMMY) CHEW Chew 2 capsules by mouth daily.    ? ?No current facility-administered medications for this visit.  ? ? ?No Known Allergies ? ?REVIEW OF SYSTEMS (negative unless checked):  ? ?Cardiac:  ?[]  Chest pain or chest pressure? ?[]  Shortness of breath upon activity? ?[]  Shortness of breath when lying flat? ?[]  Irregular heart rhythm? ? ?Vascular:  ?[]  Pain in calf, thigh, or hip brought on by walking? ?[]  Pain in feet at night that wakes you up from your sleep? ?[]  Blood clot in your veins? ?[x]  Leg swelling? ? ?Pulmonary:  ?[]  Oxygen at home? ?[]  Productive cough? ?[]  Wheezing? ? ?Neurologic:  ?[]  Sudden weakness in arms or legs? ?[]  Sudden numbness in arms or legs? ?[]  Sudden onset of difficult speaking or slurred speech? ?[]  Temporary loss of vision in one eye? ?[]  Problems with dizziness? ? ?Gastrointestinal:  ?[]  Blood in stool? ?[]  Vomited blood? ? ?Genitourinary:  ?[]  Burning when urinating? ?[]  Blood in urine? ? ?Psychiatric:  ?[]  Major depression ? ?Hematologic:  ?[]  Bleeding problems? ?[]  Problems with blood clotting? ? ?Dermatologic:  ?[]  Rashes or ulcers? ? ?Constitutional:  ?[]  Fever or chills? ? ?Ear/Nose/Throat:  ?[]  Change in hearing? ?[]  Nose bleeds? ?[]  Sore throat? ? ?Musculoskeletal:  ?[]  Back pain? ?[]  Joint pain? ?[]  Muscle pain? ? ? ?Physical Examination  ?  ? ?Vitals:  ? 08/31/21 1351  ?BP: 129/90  ?Pulse: 61  ?Temp: 97.8 ?F (36.6 ?C)  ?Weight: 194 lb (88 kg)  ?Height: 6' (1.829 m)  ? ?Body mass index is 26.31  kg/m?. ? ?General:  WDWN in NAD; vital signs documented above ?Gait: Normal ?HENT: WNL, normocephalic ?Pulmonary: normal non-labored breathing , without wheezing ?Cardiac: regular HR, with Murmurs without carotid bruit ?Abdomen: soft, NT, no masses ?Vascular Exam/Pulses:2= radial pulses, 2+ femoral, 2+ DP pulses bilaterally ?Extremities: without varicose veins, with spider veins around medial ankle, with edema of right distal leg and ankle, without stasis pigmentation, without lipodermatosclerosis, without ulcers ?Musculoskeletal: no muscle wasting or atrophy  ?Neurologic: A&O X 3;  No focal weakness or paresthesias are detected ?Psychiatric:  The pt has Normal affect. ? ?Non-invasive Vascular Imaging  ? ?BLE Venous Insufficiency Duplex (08/26/21):  ?RLE:  ?No DVT and  SVT,  ?GSV reflux only in distal calf ?GSV diameter <0.3  ?No SSV reflux  ?No deep venous reflux ? ? ?Medical Decision Making  ? ?Cody Barnes is a 61 y.o. male who presents with: RLE chronic venous insufficiency. Duplex shows no DVT or SVT. GSV with very minimal significant reflux in right calf. No SSV reflux or deep reflux. GSV is also very small. Based on these findings he would not be candidate for any intervention. Clinically his swelling is more significant then I would expect from his duplex results. I think likely he has more contributing to his swelling then just his venous disease. Possibly this is secondary to whatever tendon injury he also has.  ?Based on the patient's history and examination, I recommend: daily elevation above the level of his heart, knee high compression stockings of 15- 20 mmHg, continue exercise,and  refraining from prolonged sitting or standing. ?Would recommend he continue to follow up with Podiatrist regarding tendon injury ?Would Recommend NSAIDs for continued pain ?He can follow up as needed if he has new or concerning symptoms ? ? ?Cody Congress, PA-C ?[facility] ?Office:  [phone] ? ?08/31/2021, 3:51 PM ? ?Clinic MD: Dickson/ Randie Heinz ?

## 2021-09-06 ENCOUNTER — Telehealth: Payer: Self-pay | Admitting: Interventional Cardiology

## 2021-09-06 ENCOUNTER — Encounter: Payer: Self-pay | Admitting: Podiatry

## 2021-09-06 ENCOUNTER — Telehealth: Payer: Self-pay | Admitting: *Deleted

## 2021-09-06 ENCOUNTER — Ambulatory Visit: Payer: 59 | Admitting: Podiatry

## 2021-09-06 DIAGNOSIS — I872 Venous insufficiency (chronic) (peripheral): Secondary | ICD-10-CM

## 2021-09-06 DIAGNOSIS — S86311A Strain of muscle(s) and tendon(s) of peroneal muscle group at lower leg level, right leg, initial encounter: Secondary | ICD-10-CM

## 2021-09-06 DIAGNOSIS — M66871 Spontaneous rupture of other tendons, right ankle and foot: Secondary | ICD-10-CM

## 2021-09-06 NOTE — Telephone Encounter (Signed)
Pt agreeable to plan of care for tele pre op appt 09/12/21 @ 9 am. Med rec and consent have been done.  ?  ?Patient Consent for Virtual Visit  ? ? ?   ? ?Cody Barnes has provided verbal consent on 09/06/2021 for a virtual visit (video or telephone). ? ? ?CONSENT FOR VIRTUAL VISIT FOR:  Cody Barnes  ?By participating in this virtual visit I agree to the following: ? ?I hereby voluntarily request, consent and authorize [facility] and its employed or contracted physicians, physician assistants, nurse practitioners or other licensed health care professionals (the Practitioner), to provide me with telemedicine health care services (the ?Services") as deemed necessary by the treating Practitioner. I acknowledge and consent to receive the Services by the Practitioner via telemedicine. I understand that the telemedicine visit will involve communicating with the Practitioner through live audiovisual communication technology and the disclosure of certain medical information by electronic transmission. I acknowledge that I have been given the opportunity to request an in-person assessment or other available alternative prior to the telemedicine visit and am voluntarily participating in the telemedicine visit. ? ?I understand that I have the right to withhold or withdraw my consent to the use of telemedicine in the course of my care at any time, without affecting my right to future care or treatment, and that the Practitioner or I may terminate the telemedicine visit at any time. I understand that I have the right to inspect all information obtained and/or recorded in the course of the telemedicine visit and may receive copies of available information for a reasonable fee.  I understand that some of the potential risks of receiving the Services via telemedicine include:  ?Delay or interruption in medical evaluation due to technological equipment failure or disruption; ?Information transmitted may not be sufficient (e.g.  poor resolution of images) to allow for appropriate medical decision making by the Practitioner; and/or  ?In rare instances, security protocols could fail, causing a breach of personal health information. ? ?Furthermore, I acknowledge that it is my responsibility to provide information about my medical history, conditions and care that is complete and accurate to the best of my ability. I acknowledge that Practitioner's advice, recommendations, and/or decision may be based on factors not within their control, such as incomplete or inaccurate data provided by me or distortions of diagnostic images or specimens that may result from electronic transmissions. I understand that the practice of medicine is not an exact science and that Practitioner makes no warranties or guarantees regarding treatment outcomes. I acknowledge that a copy of this consent can be made available to me via my patient portal [facility] MyChart), or I can request a printed copy by calling the office of [facility].   ? ?I understand that my insurance will be billed for this visit.  ? ?I have read or had this consent read to me. ?I understand the contents of this consent, which adequately explains the benefits and risks of the Services being provided via telemedicine.  ?I have been provided ample opportunity to ask questions regarding this consent and the Services and have had my questions answered to my satisfaction. ?I give my informed consent for the services to be provided through the use of telemedicine in my medical care ? ? ? ?

## 2021-09-06 NOTE — Telephone Encounter (Signed)
Pt agreeable to plan of care for tele pre op appt 09/12/21 @ 9 am. Med rec and consent have been done. ?

## 2021-09-06 NOTE — Telephone Encounter (Signed)
Primary Cardiologist:Jayadeep Eldridge Dace, MD ? ?Chart reviewed as part of pre-operative protocol coverage. Because of Cody Barnes past medical history and time since last visit, he/she will require a virtual visit/telephone call in order to better assess preoperative cardiovascular risk. ? ?Pre-op covering staff: ?- Please contact patient, obtain consent, and schedule appointment  ? ?Levi Aland, NP-C ? ?  ?09/06/2021, 3:33 PM ?[facility] ?[address] ?Office [phone] Fax [phone] ? ?

## 2021-09-06 NOTE — Progress Notes (Signed)
Cody Barnes presents today for follow-up of his posterior tibial tendinitis and the peroneal tendon tear.  He states that he continues to wear his compression hose and swelling has gone down and the foot feels considerably better. ? ?Objective: Vital signs are stable he is alert and oriented x3.  Pulses are palpable.  Edema has diminished considerably with compression hose he still has tenderness on palpation of the posterior tibial tendon as well as some tenderness on palpation of the peroneals laterally.  MRI does indicate a tear of the peroneus longus measuring 4.8 cm in total length and synovitis of the posterior tibial tendon. ? 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 ? ?Plan: At this point we consented him today for a peroneal tendon repair as well as a posterior tibial tendon repair but first were going to evaluate them with an exploratory incision.  He understands this and is amenable to it.  Synovectomies will be performed if necessary.  We will also going to inject PRP to both sites and we are going to cast him if repairs were performed.  I will follow-up with him in the near future for surgical intervention however we are requesting cardiac clearance from his cardiologist. ?

## 2021-09-06 NOTE — Telephone Encounter (Signed)
? ?  Pre-operative Risk Assessment  ?  ?Patient Name: Cody Barnes  ?DOB: [birth year] ?MRN: [id number]  ? ?  ? ?Request for Surgical Clearance   ? ?Procedure:     Repair to Posteril and Tibial Tendon and Peroneal Tendon ( Right Foot) ? ?Date of Surgery:  Clearance 10/07/21                              ?   ?Surgeon:  Dr. Arbutus Ped ?Surgeon's Group or Practice Name:  [facility] ?Phone number:  [phone] ?Fax number:  [phone] ?  ?Type of Clearance Requested:   ?Both - Medical and Pharmacy ?  ?Type of Anesthesia:     Anesthesiologist Choice ? ?6. Are there any other requests or questions from the surgeon?    :1}  ?Additional requests/questions:     Does patient need any Antibiotics or any medication to be held ? ?Signed, ?Belisicia T Harris   ?09/06/2021, 1:42 PM  ? ?

## 2021-09-09 ENCOUNTER — Telehealth: Payer: Self-pay | Admitting: Urology

## 2021-09-09 NOTE — Telephone Encounter (Signed)
DOS - 10/07/21 ? ?REPAIR PERONEAL TENDON RIGHT --- [phone] ?REPAIR POSTERIOR TIBIAL TENDON RIGHT --- [phone] ? ? ?[employer] EFFECTIVE DATE - 05/29/88 ? ?PLAN DEDUCTIBLE -  $2,500.00 W/ $590.08 REMAINING ?OUT OF POCKET - $5,356.69 REMAINING ?COINSURANCE - 20% ?COPAY - $0.00 ? ?SPOKE WITH WENDY C. WITH [employer] AND SHE STATED THAT FOR CPT CODES 89211 AND [phone] NO PRIOR AUTH IS REQUIRED. ? ?REF # [id number] C. 09/09/21 AT 9:10 CT ? ? ?

## 2021-09-12 ENCOUNTER — Ambulatory Visit (INDEPENDENT_AMBULATORY_CARE_PROVIDER_SITE_OTHER): Payer: 59 | Admitting: Physician Assistant

## 2021-09-12 DIAGNOSIS — Z01810 Encounter for preprocedural cardiovascular examination: Secondary | ICD-10-CM

## 2021-09-12 NOTE — Progress Notes (Signed)
? ?Virtual Visit via Telephone Note  ? ?This visit type was conducted due to national recommendations for restrictions regarding the COVID-19 Pandemic (e.g. social distancing) in an effort to limit this patient's exposure and mitigate transmission in our community.  Due to his co-morbid illnesses, this patient is at least at moderate risk for complications without adequate follow up.  This format is felt to be most appropriate for this patient at this time.  The patient did not have access to video technology/had technical difficulties with video requiring transitioning to audio format only (telephone).  All issues noted in this document were discussed and addressed.  No physical exam could be performed with this format.  Please refer to the patient's chart for his  consent to telehealth for [facility]. ? ?Evaluation Performed:  Preoperative cardiovascular risk assessment ?_____________  ? ?Date:  09/12/2021  ? ?Patient ID:  Cody Barnes, DOB [birth year], MRN [id number] ?Patient Location:  ?Home ?Provider location:   ?Office ? ?Primary Care Provider:  Etta Grandchild, MD ?Primary Cardiologist:  Lance Muss, MD ? ?Chief Complaint  ?  ?61 y.o. y/o male with a h/o valve surgery (prior MV repair, bioprosthetic aortic valve replacement 2015), HLD who is pending foot surgery, and presents today for telephonic preoperative cardiovascular risk assessment. ? ?Past Medical History  ?  ?Past Medical History:  ?Diagnosis Date  ? Aortic insufficiency   ? Bell's palsy   ? Transient  ? Elevated PSA 10/12/2014  ? Family history of breast cancer   ? H/O mitral valve repair 12/16/2015  ? Heart murmur   ? Mitral regurgitation   ? Mitral valve prolapse   ? 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 ?Past Surgical History:  ?Procedure Laterality Date  ? AORTIC VALVE REPLACEMENT    ? HIP FRACTURE SURGERY    ? left  ? INGUINAL HERNIA REPAIR    ? as infant  ? LEFT AND RIGHT HEART CATHETERIZATION WITH  CORONARY ANGIOGRAM N/A 12/12/2013  ? Procedure: LEFT AND RIGHT HEART CATHETERIZATION WITH CORONARY ANGIOGRAM;  Surgeon: Corky Crafts, MD;  Location: [facility] CATH LAB;  Service: Cardiovascular;  Laterality: N/A;  ? MITRAL VALVE REPLACEMENT    ? OPEN REDUCTION INTERNAL FIXATION (ORIF) METACARPAL Left 01/25/2021  ? Procedure: Left index finger open reduction internal fixation metacarpal fracture and manipulation under anesthesia middle finger base fracture;  Surgeon: Dominica Severin, MD;  Location: [facility] OR;  Service: Orthopedics;  Laterality: Left;  1hr ?Block with IV Sedation  ? TEE WITHOUT CARDIOVERSION N/A 11/25/2013  ? Procedure: TRANSESOPHAGEAL ECHOCARDIOGRAM (TEE);  Surgeon: Quintella Reichert, MD;  Location: [facility] ENDOSCOPY;  Service: Cardiovascular;  Laterality: N/A;  ? ? ?Allergies ? ?No Known Allergies ? ?History of Present Illness  ?  ?Cody Barnes is a 61 y.o. male who presents via audio/video conferencing for a telehealth visit today. Last echo 09/2018 EF 60-65%, impaired diastolic relaxation, no significant valvular abnormalities with known repair/replacement. Dr. Hoyle Barr note reports that cardiac cath prior to valve surgery was negative for significant CAD. Pt was last seen in cardiology clinic on 11/2020 by Dr. Eldridge Dace.  At that time Baruch Lewers was doing well. The patient is now pending Repair of Peroneal Tendon and Posterior Tibial Tendon on the right. Since his last visit, he has done well. Though he has not been hiking in the last month due to his foot issues, he continues to walk 4-6K steps daily including walking his large dog without any cardiac symptoms. No chest  pain, dyspnea, palpitations, orthopnea, or syncope. ? ?Home Medications  ?  ?Prior to Admission medications   ?Medication Sig Start Date End Date Taking? Authorizing Provider  ?atorvastatin (LIPITOR) 20 MG tablet Take 1 tablet (20 mg total) by mouth daily. 03/14/21   Corky Crafts, MD  ?fexofenadine (ALLEGRA) 180 MG tablet Take  180 mg by mouth daily as needed for allergies or rhinitis.    [provider]  ?Multiple Vitamins-Minerals (ADULT GUMMY) CHEW Chew 2 capsules by mouth daily.    [provider]  ? ? ?Physical Exam  ?  ?Vital Signs:  Faizan Geraci does not have vital signs available for review today.  ? ?Given telephonic nature of communication, physical exam is limited. ?AAOx3. NAD. Normal affect.  Speech and respirations are unlabored. ? ?Accessory Clinical Findings  ?  ?None ? ?Assessment & Plan  ?  ?1.  Preoperative Cardiovascular Risk Assessment: RCRI is 0.4% indicating low CV risk. Patient is able to exceed over 4 METS with regular physical activity without any angina or dyspnea. Therefore, based on ACC/AHA guidelines, the patient would be at acceptable risk for the planned procedure without further cardiovascular testing.  ? ?Regarding antibiotics, there is no specific pre-op antibiotic regimen that is specified in cardiac guidelines for orthopedic surgery for this patient's individual history though would defer to orthopedic team to follow their standard practice. The patient is aware, however, to continue to observe SBE prophylaxis for all dental procedures (states he gets his amoxicillin rx through dentist).  ? ?The patient was advised that if he develops new symptoms prior to surgery to contact our office to arrange for a follow-up visit, and he verbalized understanding. ? ? ?A copy of this note will be routed to requesting surgeon. ? ?Time:   ?Today, I have spent 5 minutes with the patient with telehealth technology discussing medical history, symptoms, and management plan.   ? ? ?Laurann Montana, PA-C ? ?09/12/2021, 8:56 AM ? ?

## 2021-09-15 ENCOUNTER — Ambulatory Visit: Payer: 59 | Admitting: Podiatry

## 2021-10-06 ENCOUNTER — Other Ambulatory Visit: Payer: Self-pay | Admitting: Podiatry

## 2021-10-06 MED ORDER — OXYCODONE-ACETAMINOPHEN 10-325 MG PO TABS: 1.0000 | ORAL_TABLET | Freq: Three times a day (TID) | ORAL | 0 refills | Status: AC | PRN

## 2021-10-06 MED ORDER — CEPHALEXIN 500 MG PO CAPS: 500.0000 mg | ORAL_CAPSULE | Freq: Three times a day (TID) | ORAL | 0 refills | Status: DC

## 2021-10-06 MED ORDER — ONDANSETRON HCL 4 MG PO TABS: 4.0000 mg | ORAL_TABLET | Freq: Three times a day (TID) | ORAL | 0 refills | Status: DC | PRN

## 2021-10-07 DIAGNOSIS — S86311A Strain of muscle(s) and tendon(s) of peroneal muscle group at lower leg level, right leg, initial encounter: Secondary | ICD-10-CM

## 2021-10-07 DIAGNOSIS — M66871 Spontaneous rupture of other tendons, right ankle and foot: Secondary | ICD-10-CM

## 2021-10-11 ENCOUNTER — Telehealth: Payer: Self-pay | Admitting: Podiatry

## 2021-10-11 NOTE — Telephone Encounter (Signed)
Pt was needing a work note when he can return back to work. Wife stated that he plans on going back to work 5/22 unless Dr Al Corpus doesn't permits it. ? ?Please advise ?

## 2021-10-13 ENCOUNTER — Ambulatory Visit (INDEPENDENT_AMBULATORY_CARE_PROVIDER_SITE_OTHER): Payer: 59 | Admitting: Podiatry

## 2021-10-13 ENCOUNTER — Encounter: Payer: Self-pay | Admitting: Podiatry

## 2021-10-13 DIAGNOSIS — M66871 Spontaneous rupture of other tendons, right ankle and foot: Secondary | ICD-10-CM

## 2021-10-13 DIAGNOSIS — Z9889 Other specified postprocedural states: Secondary | ICD-10-CM

## 2021-10-13 NOTE — Progress Notes (Signed)
He presents today for a follow-up of his surgery from last week.  He denies fever chills nausea vomiting muscle aches and pains states that the cast has been a little bit tender around the foot.  Continues to use his knee scooter states that he has had some chills but no fever.  And is not having to take a lot of pain medicine.  Objective: Vital signs are stable oriented x3 there is no acute swelling in the toes and cast is loose proximally.  He has good temperature to the toes and good range of motion with good sensation.  Vital signs are normal his heart rate of 61 saturation is 96% on room air is temperature is 96.3 and his blood pressure is 114/21.  Assessment: Well-healing surgical foot.  Plan: I will follow-up with him in 1 week for cast removal and reapplication.  He will continue nonweightbearing status keep this dry and clean.  I will allow him to go back to work if he can keep it elevated.

## 2021-10-20 ENCOUNTER — Encounter: Payer: 59 | Admitting: Podiatry

## 2021-10-20 ENCOUNTER — Ambulatory Visit (INDEPENDENT_AMBULATORY_CARE_PROVIDER_SITE_OTHER): Payer: 59 | Admitting: Podiatry

## 2021-10-20 DIAGNOSIS — M66871 Spontaneous rupture of other tendons, right ankle and foot: Secondary | ICD-10-CM

## 2021-10-23 NOTE — Progress Notes (Signed)
He presents today for second postop visit date of surgery 10/07/2021 posterior tibial tendon repair peroneal tendon repair with a cast all on the right foot.  He denies fever chills nausea vomiting muscle aches pains calf pain back pain chest pain shortness of breath.  States he has been doing just fine minimally tender.  Objective: Presents today with his daughter he is currently on a knee scooter states cast intact dry and clean.  Once cast was removed dressing was intact dry and clean.  Once removed demonstrates minimal edema no erythema cellulitis drainage or odor staples are intact margins appear to be well coapted there is no purulence no malodor with this.  He has good range of motion that is mildly tender inversion and eversion dorsiflexion and plantarflexion.  Assessment: Well-healing surgical foot and ankle.  Plan: Redressed today with a dry sterile compressive dressing replaced his cast today for another 2 weeks continue dry and clean nonweightbearing follow-up with me in 2 weeks for removal of cast and placement of cam walker and removal of staples.

## 2021-11-03 ENCOUNTER — Ambulatory Visit (INDEPENDENT_AMBULATORY_CARE_PROVIDER_SITE_OTHER): Payer: 59 | Admitting: Podiatry

## 2021-11-03 ENCOUNTER — Encounter: Payer: Self-pay | Admitting: Podiatry

## 2021-11-03 DIAGNOSIS — M66871 Spontaneous rupture of other tendons, right ankle and foot: Secondary | ICD-10-CM

## 2021-11-03 DIAGNOSIS — Z9889 Other specified postprocedural states: Secondary | ICD-10-CM

## 2021-11-03 NOTE — Progress Notes (Signed)
He presents today with his wife with his cast on his right lower extremity utilizing his knee scooter.  He states that is really been doing pretty well no problems.  Objective: Vital signs are stable he is alert and oriented x3.  There is mild edema no erythema cellulitis drainage or odor sutures were removed today staples were removed today.  No signs of infection at all.  He is got good dorsiflexion plantarflexion inversion and eversion there is a little bit tender on inversion.  Assessment: Well-healing surgical foot.  Plan: Put him in a compression garment today.  We will start him back in his cam walker.  We will start partial weightbearing at this time utilizing his knee scooter and crutches.  I will follow-up with him in 2 weeks.

## 2021-11-10 ENCOUNTER — Telehealth: Payer: Self-pay | Admitting: *Deleted

## 2021-11-10 ENCOUNTER — Encounter: Payer: 59 | Admitting: Podiatry

## 2021-11-10 NOTE — Telephone Encounter (Signed)
Patient's wife is calling she is very concerned that surgical foot may be infected, foot is still swollen, outside of the ankle, incision site is leaking, blood on compression sock and the foot is tingling. Please advise

## 2021-11-11 ENCOUNTER — Encounter: Payer: 59 | Admitting: Podiatry

## 2021-11-11 ENCOUNTER — Ambulatory Visit (INDEPENDENT_AMBULATORY_CARE_PROVIDER_SITE_OTHER): Payer: 59 | Admitting: Podiatry

## 2021-11-11 DIAGNOSIS — Z9889 Other specified postprocedural states: Secondary | ICD-10-CM

## 2021-11-11 NOTE — Progress Notes (Signed)
  Subjective:  Patient ID: Cody Barnes, male    DOB: 1960/08/26,  MRN: [id number]  Chief Complaint  Patient presents with   Routine Post Op    POV #4 DOS 10/07/2021 RT FOOT POSTERIOR TIBIAL TENDON & REPAIR PERONEAL TENDON    DOS: 10/07/21  Procedure: Right PT tendon and peroneal tendon repair.   61 y.o. male returns for POV#4. Patient is 5 weeks out from surgery with Dr. Al Corpus. Has been walking in boot and was concerned about drainage in his medial incision and possible infection. Denies any pain. Denies any increased redness or increased swelling. Has been wearing Surgigrip to help with swelling.   Review of Systems: Negative except as noted in the HPI. Denies N/V/F/Ch.  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    Current Outpatient Medications:    atorvastatin (LIPITOR) 20 MG tablet, Take 1 tablet (20 mg total) by mouth daily., Disp: 90 tablet, Rfl: 2   fexofenadine (ALLEGRA) 180 MG tablet, Take 180 mg by mouth daily as needed for allergies or rhinitis., Disp: , Rfl:    Multiple Vitamins-Minerals (ADULT GUMMY) CHEW, Chew 2 capsules by mouth daily., Disp: , Rfl:    ondansetron (ZOFRAN) 4 MG tablet, Take 1 tablet (4 mg total) by mouth every 8 (eight) hours as needed., Disp: 20 tablet, Rfl: 0  Social History   Tobacco Use  Smoking Status Never  Smokeless Tobacco Never    No Known Allergies Objective:  There were no vitals filed for this visit. There is no height or weight on file to calculate BMI. Constitutional Well developed. Well nourished.  Vascular Foot warm and well perfused. Capillary refill normal to all digits.   Neurologic Normal speech. Oriented to person, place, and  time. Epicritic sensation to light touch grossly present bilaterally.  Dermatologic Skin healing well without signs of infection. Skin edges well coapted without signs of infection lateral incision. There is two small areas on medial incision with very minimal dehiscence. No erythema or edema noted around incision. No purulence or other drainage noted.   Orthopedic: Tenderness to palpation noted about the surgical site.    Assessment:   1. Status post right foot surgery    Plan:  Patient was evaluated and treated and all questions answered.  S/p foot surgery right -Progressing as expected post-operatively. -Small medial incision dehiscence without any concern for injection at this point. Advised to keep neosporin and bandage over area as well as keep leg elevated and ice as well as continue compression to aid with swelling.  -WB Status: WBAT in CAM boot -Will follow-up with Dr. Al Corpus as scheduled.   No follow-ups on file.

## 2021-11-16 ENCOUNTER — Telehealth: Payer: Self-pay | Admitting: *Deleted

## 2021-11-16 NOTE — Telephone Encounter (Signed)
Please advise 

## 2021-11-16 NOTE — Telephone Encounter (Signed)
Patient's wife is wanting to ask if husband can remove his boot for the weekend, going out of town and it is so bulky. Has been wearing compression sock, swelling has gone down, no pain. Please advise.

## 2021-11-16 NOTE — Telephone Encounter (Signed)
See Hyatt's response

## 2021-11-16 NOTE — Telephone Encounter (Signed)
Called patient giving physician's recommendations and will come by the [facility] office to pick up brace and compression sock.

## 2021-11-17 ENCOUNTER — Ambulatory Visit (INDEPENDENT_AMBULATORY_CARE_PROVIDER_SITE_OTHER): Payer: 59 | Admitting: *Deleted

## 2021-11-17 DIAGNOSIS — Z9889 Other specified postprocedural states: Secondary | ICD-10-CM

## 2021-11-17 DIAGNOSIS — M66871 Spontaneous rupture of other tendons, right ankle and foot: Secondary | ICD-10-CM

## 2021-11-17 NOTE — Progress Notes (Signed)
Patient came by the office this morning to pick up an ankle stabilizer brace. He is going on a trip to [place] and wanted to be able to wear his sneakers.  I fitted him for the brace and reviewed instructions for wearing.   He will follow up in one month to see Dr. Al Corpus.

## 2021-11-22 ENCOUNTER — Encounter: Payer: 59 | Admitting: Podiatry

## 2021-11-24 ENCOUNTER — Ambulatory Visit (INDEPENDENT_AMBULATORY_CARE_PROVIDER_SITE_OTHER): Payer: 59 | Admitting: Podiatry

## 2021-11-24 ENCOUNTER — Encounter: Payer: Self-pay | Admitting: Podiatry

## 2021-11-24 DIAGNOSIS — Z9889 Other specified postprocedural states: Secondary | ICD-10-CM

## 2021-11-24 DIAGNOSIS — M66871 Spontaneous rupture of other tendons, right ankle and foot: Secondary | ICD-10-CM

## 2021-11-24 NOTE — Progress Notes (Signed)
He presents today date of surgery 10/07/2021 posterior tibial tendon repair peroneal tendon repair right foot.  States that is just a swollen but overall is feeling fine and the brace really helps.  Objective: Vital signs are stable alert oriented x3 moderate pitting edema right lower extremity with a history of venous insufficiency greater saphenous vein.  Surgical site is gone on to heal uneventfully.  There is no erythema edema salines drainage or has good inversion against resistance.  Assessment: Well-healing surgical foot.  Plan: Continue the use of the brace for about another month continue the use of the compression sleeves.

## 2021-11-30 ENCOUNTER — Ambulatory Visit (INDEPENDENT_AMBULATORY_CARE_PROVIDER_SITE_OTHER): Payer: 59 | Admitting: Internal Medicine

## 2021-11-30 ENCOUNTER — Encounter: Payer: Self-pay | Admitting: Internal Medicine

## 2021-11-30 VITALS — BP 136/78 | HR 62 | Temp 98.1°F | Resp 16 | Ht 72.0 in | Wt 197.0 lb

## 2021-11-30 DIAGNOSIS — Z23 Encounter for immunization: Secondary | ICD-10-CM | POA: Diagnosis not present

## 2021-11-30 DIAGNOSIS — R600 Localized edema: Secondary | ICD-10-CM | POA: Diagnosis not present

## 2021-11-30 DIAGNOSIS — I10 Essential (primary) hypertension: Secondary | ICD-10-CM | POA: Diagnosis not present

## 2021-11-30 DIAGNOSIS — Z0000 Encounter for general adult medical examination without abnormal findings: Secondary | ICD-10-CM

## 2021-11-30 DIAGNOSIS — E785 Hyperlipidemia, unspecified: Secondary | ICD-10-CM | POA: Diagnosis not present

## 2021-11-30 LAB — CBC WITH DIFFERENTIAL/PLATELET
Basophils Absolute: 0.1 10*3/uL (ref 0.0–0.1)
Basophils Relative: 1.3 % (ref 0.0–3.0)
Eosinophils Absolute: 0.2 10*3/uL (ref 0.0–0.7)
Eosinophils Relative: 3.3 % (ref 0.0–5.0)
HCT: 43.1 % (ref 39.0–52.0)
Hemoglobin: 14.5 g/dL (ref 13.0–17.0)
Lymphocytes Relative: 22.5 % (ref 12.0–46.0)
Lymphs Abs: 1.4 10*3/uL (ref 0.7–4.0)
MCHC: 33.7 g/dL (ref 30.0–36.0)
MCV: 89.8 fl (ref 78.0–100.0)
Monocytes Absolute: 0.8 10*3/uL (ref 0.1–1.0)
Monocytes Relative: 12.6 % — ABNORMAL HIGH (ref 3.0–12.0)
Neutro Abs: 3.7 10*3/uL (ref 1.4–7.7)
Neutrophils Relative %: 60.3 % (ref 43.0–77.0)
Platelets: 195 10*3/uL (ref 150.0–400.0)
RBC: 4.8 Mil/uL (ref 4.22–5.81)
RDW: 13.3 % (ref 11.5–15.5)
WBC: 6.1 10*3/uL (ref 4.0–10.5)

## 2021-11-30 LAB — BASIC METABOLIC PANEL
BUN: 21 mg/dL (ref 6–23)
CO2: 24 mEq/L (ref 19–32)
Calcium: 9.2 mg/dL (ref 8.4–10.5)
Chloride: 105 mEq/L (ref 96–112)
Creatinine, Ser: 1.21 mg/dL (ref 0.40–1.50)
GFR: 64.6 mL/min (ref >60.00)
Glucose, Bld: 97 mg/dL (ref 70–99)
Potassium: 4.2 mEq/L (ref 3.5–5.1)
Sodium: 139 mEq/L (ref 135–145)

## 2021-11-30 LAB — HEPATIC FUNCTION PANEL
ALT: 47 U/L (ref 0–53)
AST: 25 U/L (ref 0–37)
Albumin: 4.6 g/dL (ref 3.5–5.2)
Alkaline Phosphatase: 109 U/L (ref 39–117)
Bilirubin, Direct: 0.1 mg/dL (ref 0.0–0.3)
Total Bilirubin: 0.6 mg/dL (ref 0.2–1.2)
Total Protein: 6.8 g/dL (ref 6.0–8.3)

## 2021-11-30 LAB — LIPID PANEL
Cholesterol: 163 mg/dL (ref 0–200)
HDL: 46.3 mg/dL (ref >39.00)
LDL Cholesterol: 96 mg/dL (ref 0–99)
NonHDL: 116.63
Total CHOL/HDL Ratio: 4
Triglycerides: 101 mg/dL (ref 0.0–149.0)
VLDL: 20.2 mg/dL (ref 0.0–40.0)

## 2021-11-30 LAB — TSH: TSH: 1.71 u[IU]/mL (ref 0.35–5.50)

## 2021-11-30 NOTE — Patient Instructions (Signed)
Health Maintenance, Male Adopting a healthy lifestyle and getting preventive care are important in promoting health and wellness. Ask your health care provider about: The right schedule for you to have regular tests and exams. Things you can do on your own to prevent diseases and keep yourself healthy. What should I know about diet, weight, and exercise? Eat a healthy diet  Eat a diet that includes plenty of vegetables, fruits, low-fat dairy products, and lean protein. Do not eat a lot of foods that are high in solid fats, added sugars, or sodium. Maintain a healthy weight Body mass index (BMI) is a measurement that can be used to identify possible weight problems. It estimates body fat based on height and weight. Your health care provider can help determine your BMI and help you achieve or maintain a healthy weight. Get regular exercise Get regular exercise. This is one of the most important things you can do for your health. Most adults should: Exercise for at least 150 minutes each week. The exercise should increase your heart rate and make you sweat (moderate-intensity exercise). Do strengthening exercises at least twice a week. This is in addition to the moderate-intensity exercise. Spend less time sitting. Even light physical activity can be beneficial. Watch cholesterol and blood lipids Have your blood tested for lipids and cholesterol at 62 years of age, then have this test every 5 years. You may need to have your cholesterol levels checked more often if: Your lipid or cholesterol levels are high. You are older than 62 years of age. You are at high risk for heart disease. What should I know about cancer screening? Many types of cancers can be detected early and may often be prevented. Depending on your health history and family history, you may need to have cancer screening at various ages. This may include screening for: Colorectal cancer. Prostate cancer. Skin cancer. Lung  cancer. What should I know about heart disease, diabetes, and high blood pressure? Blood pressure and heart disease High blood pressure causes heart disease and increases the risk of stroke. This is more likely to develop in people who have high blood pressure readings or are overweight. Talk with your health care provider about your target blood pressure readings. Have your blood pressure checked: Every 3-5 years if you are 18-39 years of age. Every year if you are 40 years old or older. If you are between the ages of 65 and 75 and are a current or former smoker, ask your health care provider if you should have a one-time screening for abdominal aortic aneurysm (AAA). Diabetes Have regular diabetes screenings. This checks your fasting blood sugar level. Have the screening done: Once every three years after age 45 if you are at a normal weight and have a low risk for diabetes. More often and at a younger age if you are overweight or have a high risk for diabetes. What should I know about preventing infection? Hepatitis B If you have a higher risk for hepatitis B, you should be screened for this virus. Talk with your health care provider to find out if you are at risk for hepatitis B infection. Hepatitis C Blood testing is recommended for: Everyone born from 1945 through 1965. Anyone with known risk factors for hepatitis C. Sexually transmitted infections (STIs) You should be screened each year for STIs, including gonorrhea and chlamydia, if: You are sexually active and are younger than 62 years of age. You are older than 62 years of age and your   health care provider tells you that you are at risk for this type of infection. Your sexual activity has changed since you were last screened, and you are at increased risk for chlamydia or gonorrhea. Ask your health care provider if you are at risk. Ask your health care provider about whether you are at high risk for HIV. Your health care provider  may recommend a prescription medicine to help prevent HIV infection. If you choose to take medicine to prevent HIV, you should first get tested for HIV. You should then be tested every 3 months for as long as you are taking the medicine. Follow these instructions at home: Alcohol use Do not drink alcohol if your health care provider tells you not to drink. If you drink alcohol: Limit how much you have to 0-2 drinks a day. Know how much alcohol is in your drink. In the [place] one drink equals one 12 oz bottle of beer (355 mL), one 5 oz glass of wine (148 mL), or one 1 oz glass of hard liquor (44 mL). Lifestyle Do not use any products that contain nicotine or tobacco. These products include cigarettes, chewing tobacco, and vaping devices, such as e-cigarettes. If you need help quitting, ask your health care provider. Do not use street drugs. Do not share needles. Ask your health care provider for help if you need support or information about quitting drugs. General instructions Schedule regular health, dental, and eye exams. Stay current with your vaccines. Tell your health care provider if: You often feel depressed. You have ever been abused or do not feel safe at home. Summary Adopting a healthy lifestyle and getting preventive care are important in promoting health and wellness. Follow your health care provider's instructions about healthy diet, exercising, and getting tested or screened for diseases. Follow your health care provider's instructions on monitoring your cholesterol and blood pressure. This information is not intended to replace advice given to you by your health care provider. Make sure you discuss any questions you have with your health care provider. Document Revised: 10/04/2020 Document Reviewed: 10/04/2020 Elsevier Patient Education  2023 [employer]  

## 2021-11-30 NOTE — Progress Notes (Signed)
Subjective:  Patient ID: Cody Barnes, male    DOB: Jul 15, 1960  Age: 61 y.o. MRN: [id number]  CC: Annual Exam and Hyperlipidemia   HPI Cody Barnes presents for a CPX and f/up -   He recently had right Achilles surgery and has persistent, painless swelling in his right lower leg.  He is active and denies chest pain, shortness of breath, diaphoresis, or palpitations.  Outpatient Medications Prior to Visit  Medication Sig Dispense Refill   atorvastatin (LIPITOR) 20 MG tablet Take 1 tablet (20 mg total) by mouth daily. 90 tablet 2   fexofenadine (ALLEGRA) 180 MG tablet Take 180 mg by mouth daily as needed for allergies or rhinitis.     Multiple Vitamins-Minerals (ADULT GUMMY) CHEW Chew 2 capsules by mouth daily.     ondansetron (ZOFRAN) 4 MG tablet Take 1 tablet (4 mg total) by mouth every 8 (eight) hours as needed. 20 tablet 0   No facility-administered medications prior to visit.    ROS Review of Systems  Constitutional: Negative.  Negative for diaphoresis and fatigue.  HENT: Negative.    Eyes: Negative.   Respiratory:  Negative for cough, chest tightness, shortness of breath and wheezing.   Cardiovascular:  Positive for leg swelling. Negative for chest pain and palpitations.  Gastrointestinal:  Negative for abdominal pain, constipation, diarrhea, nausea and vomiting.  Endocrine: Negative.   Genitourinary: Negative.  Negative for difficulty urinating.  Musculoskeletal: Negative.   Skin: Negative.   Neurological:  Negative for dizziness, weakness and light-headedness.  Hematological:  Negative for adenopathy. Does not bruise/bleed easily.  Psychiatric/Behavioral: Negative.      Objective:  BP 136/78 (BP Location: Right Arm, Patient Position: Sitting)   Pulse 62   Temp 98.1 F (36.7 C) (Oral)   Resp 16   Ht 6' (1.829 m)   Wt 197 lb (89.4 kg)   SpO2 96%   BMI 26.72 kg/m   BP Readings from Last 3 Encounters:  11/30/21 136/78  08/31/21 129/90  07/29/21 130/80     Wt Readings from Last 3 Encounters:  11/30/21 197 lb (89.4 kg)  08/31/21 194 lb (88 kg)  07/29/21 195 lb 12.8 oz (88.8 kg)    Physical Exam Vitals reviewed.  HENT:     Nose: Nose normal.     Mouth/Throat:     Mouth: Mucous membranes are moist.  Eyes:     Conjunctiva/sclera: Conjunctivae normal.  Cardiovascular:     Rate and Rhythm: Normal rate and regular rhythm.     Heart sounds: Murmur heard.     Systolic murmur is present with a grade of 2/6.     No friction rub.     Comments: RUSB 2/6 Harsh SEM LLSB 3/6 obscures S1 Pulmonary:     Effort: Pulmonary effort is normal.     Breath sounds: No stridor. No wheezing, rhonchi or rales.  Abdominal:     General: Abdomen is flat.     Palpations: There is no mass.     Tenderness: There is no abdominal tenderness. There is no guarding.     Hernia: No hernia is present.  Musculoskeletal:     Cervical back: Neck supple.     Right lower leg: 1+ Edema present.     Left lower leg: No edema.  Lymphadenopathy:     Cervical: No cervical adenopathy.  Skin:    General: Skin is warm and dry.     Findings: No rash.  Neurological:     General: No focal deficit  present.     Mental Status: He is alert. Mental status is at baseline.  Psychiatric:        Mood and Affect: Mood normal.        Behavior: Behavior normal.     Lab Results  Component Value Date   WBC 6.8 01/25/2021   HGB 15.3 01/25/2021   HCT 46.2 01/25/2021   PLT 194 01/25/2021   GLUCOSE 94 11/11/2020   CHOL 171 11/11/2020   TRIG 93.0 11/11/2020   HDL 48.20 11/11/2020   LDLCALC 104 (H) 11/11/2020   ALT 30 11/11/2020   AST 19 11/11/2020   NA 138 12/04/2020   K 5.0 12/04/2020   CL 106 12/04/2020   CREATININE 1.1 12/04/2020   BUN 18 12/04/2020   CO2 23 (A) 12/04/2020   TSH 1.68 11/11/2020   PSA 1.71 11/11/2020   INR 1.0 12/10/2013   HGBA1C 5.3 12/28/2015    VAS [place] LOWER EXTREMITY VENOUS REFLUX  Result Date: 08/27/2021  Lower Venous Reflux Study Patient Name:   Cody Barnes  Date of Exam:   08/26/2021 Medical Rec #: [id number]       Accession #:    [id number] Date of Birth: 06/21/1960      Patient Gender: M Patient Age:   59 years Exam Location:  [facility] Procedure:      VAS [place] LOWER EXTREMITY VENOUS REFLUX Referring Phys: Jake Seats HYATT --------------------------------------------------------------------------------  Indications: Patient states that his right foot and ankle become swollen after he is active during the day., and Edema.  Comparison Study: None Performing Technologist: Ethelle Lyon  Examination Guidelines: A complete evaluation includes B-mode imaging, spectral Doppler, color Doppler, and power Doppler as needed of all accessible portions of each vessel. Bilateral testing is considered an integral part of a complete examination. Limited examinations for reoccurring indications may be performed as noted. The reflux portion of the exam is performed with the patient in reverse Trendelenburg. Significant venous reflux is defined as >500 ms in the superficial venous system, and >1 second in the deep venous system.  Venous Reflux Times +--------------+---------+------+-----------+------------+--------+ RIGHT         Reflux NoRefluxReflux TimeDiameter cmsComments                         Yes                                  +--------------+---------+------+-----------+------------+--------+ CFV           no                                             +--------------+---------+------+-----------+------------+--------+ FV mid        no                                             +--------------+---------+------+-----------+------------+--------+ Popliteal     no                                             +--------------+---------+------+-----------+------------+--------+ GSV at [employer]  no                            0.53             +--------------+---------+------+-----------+------------+--------+ GSV prox  thighno                            0.30             +--------------+---------+------+-----------+------------+--------+ GSV mid thigh no                            0.33             +--------------+---------+------+-----------+------------+--------+ GSV dist thighno                            0.23             +--------------+---------+------+-----------+------------+--------+ GSV at knee   no                            0.24             +--------------+---------+------+-----------+------------+--------+ GSV prox calf no                            0.25             +--------------+---------+------+-----------+------------+--------+ GSV dist calf           yes    >500 ms                       +--------------+---------+------+-----------+------------+--------+ SSV Pop Fossa no                            0.18             +--------------+---------+------+-----------+------------+--------+ SSV prox calf no                            0.20             +--------------+---------+------+-----------+------------+--------+ SSV mid calf  no                            0.28             +--------------+---------+------+-----------+------------+--------+   Summary: Right: - No evidence of deep vein thrombosis seen in the right lower extremity, from the common femoral through the popliteal veins. - No evidence of superficial venous thrombosis in the right lower extremity.  - Venous reflux is noted in the right greater saphenous vein in the distal calf.  *See table(s) above for measurements and observations. Electronically signed by Coral ElseVance Brabham MD on 08/27/2021 at 3:08:05 AM.    Final    VAS US ABI WITH/WO TBI  Result Date: 08/27/2021  LOWER EXTREMITY DOPPLER STUDY Patient Name:  Cody MuseSTUART Barnes  Date of Exam:   08/26/2021 Medical Rec #: [id number]       Accession #:    [id number] Date of Birth: 19-Dec-1960      Patient Gender: M Patient Age:   [age] years [facility]am [facility] Procedure:      VAS  [place] ABI WITH/WO TBI Referring Phys: MAX HYATT --------------------------------------------------------------------------------  Indications: Patient states foot/ankle swelling/pain when he has been active              during the day. Some tingling in toes occasionally. High Risk Factors: Hyperlipidemia.  Comparison Study: None Performing Technologist: Ethelle Lyon  Examination Guidelines: A complete evaluation includes at minimum, Doppler waveform signals and systolic blood pressure reading at the level of bilateral brachial, anterior tibial, and posterior tibial arteries, when vessel segments are accessible. Bilateral testing is considered an integral part of a complete examination. Photoelectric Plethysmograph (PPG) waveforms and toe systolic pressure readings are included as required and additional duplex testing as needed. Limited examinations for reoccurring indications may be performed as noted.  ABI Findings: +---------+------------------+-----+---------+--------+ Right    Rt Pressure (mmHg)IndexWaveform Comment  +---------+------------------+-----+---------+--------+ Brachial 138                                      +---------+------------------+-----+---------+--------+ PTA      160               1.16 triphasic         +---------+------------------+-----+---------+--------+ DP       144               1.04 triphasic         +---------+------------------+-----+---------+--------+ Great Toe115               0.83                   +---------+------------------+-----+---------+--------+ +---------+------------------+-----+---------+-------+ Left     Lt Pressure (mmHg)IndexWaveform Comment +---------+------------------+-----+---------+-------+ Brachial 131                                     +---------+------------------+-----+---------+-------+ PTA      173               1.25 triphasic         +---------+------------------+-----+---------+-------+ DP       157               1.14 triphasic        +---------+------------------+-----+---------+-------+ Great Toe106               0.77                  +---------+------------------+-----+---------+-------+ +-------+-----------+-----------+------------+------------+ ABI/TBIToday's ABIToday's TBIPrevious ABIPrevious TBI +-------+-----------+-----------+------------+------------+ Right  1.16       0.83                                +-------+-----------+-----------+------------+------------+ Left   1.25       0.77                                +-------+-----------+-----------+------------+------------+   Summary: Right: Resting right ankle-brachial index is within normal range. No evidence of significant right lower extremity arterial disease. The right toe-brachial index is normal. Left: Resting left ankle-brachial index is within normal range. No evidence of significant left lower extremity arterial disease. The left toe-brachial index is normal.  *See table(s) above for measurements and observations.  Electronically signed by Coral Else MD on 08/27/2021 at 3:06:57 AM.    Final  Assessment & Plan:   Cody Barnes was seen today for annual exam and hyperlipidemia.  Diagnoses and all orders for this visit:  Essential hypertension- His blood pressure is adequately well controlled. -     Basic metabolic panel; Future -     CBC with Differential/Platelet; Future -     Hepatic function panel; Future -     TSH; Future -     TSH -     Hepatic function panel -     CBC with Differential/Platelet -     Basic metabolic panel  Routine general medical examination at a health care facility- Exam completed, labs reviewed, vaccines reviewed and updated, cancer screenings are up-to-date, patient education was given. -     Cancel: Lipid panel; Future  Hyperlipidemia with target LDL less than 130- LDL goal achieved. Doing well on  the statin  -     Lipid panel; Future -     Lipid panel  Edema of right lower leg- D-dimer is normal. -     D-dimer, quantitative; Future -     D-dimer, quantitative  Other orders -     Tdap vaccine greater than or equal to 7yo IM   I am having Cody Barnes maintain his fexofenadine, Adult Gummy, atorvastatin, and ondansetron.  No orders of the defined types were placed in this encounter.    Follow-up: Return in about 6 months (around 06/02/2022).  Sanda Linger, MD

## 2021-12-01 LAB — D-DIMER, QUANTITATIVE: D-Dimer, Quant: 0.44 mcg/mL FEU (ref <0.50)

## 2021-12-04 ENCOUNTER — Other Ambulatory Visit: Payer: Self-pay | Admitting: Interventional Cardiology

## 2021-12-04 DIAGNOSIS — E785 Hyperlipidemia, unspecified: Secondary | ICD-10-CM

## 2021-12-27 ENCOUNTER — Ambulatory Visit (INDEPENDENT_AMBULATORY_CARE_PROVIDER_SITE_OTHER): Payer: 59 | Admitting: Podiatry

## 2021-12-27 DIAGNOSIS — I872 Venous insufficiency (chronic) (peripheral): Secondary | ICD-10-CM

## 2021-12-27 NOTE — Progress Notes (Signed)
He presents today date of surgery 10/07/2021 posterior tibial tendon repair on the right patient states that he is doing very well he is just concerned about the continued swelling in that right lower extremity.  He states that he does a lot of walking which he is not sure whether or not that is good or not.  Objective: Vital signs are stable he is alert oriented x3.  Pulses are palpable.  Surgical site has gone on to heal uneventfully and he has no pain whatsoever in this right foot.  He states that he still has continued discomfort in that right leg as it swells daily.  He has no calf pain on palpation is not warm to the touch but does demonstrate pitting edema.  Previous vascular evaluation demonstrated a saphenous venous insufficiency.  This seems to be a lot more than saphenous vein some going to ask for a rescan to make sure that there is nothing there.

## 2022-01-02 ENCOUNTER — Ambulatory Visit (INDEPENDENT_AMBULATORY_CARE_PROVIDER_SITE_OTHER): Payer: 59

## 2022-01-02 ENCOUNTER — Encounter (HOSPITAL_BASED_OUTPATIENT_CLINIC_OR_DEPARTMENT_OTHER): Payer: Self-pay

## 2022-01-02 ENCOUNTER — Encounter (HOSPITAL_BASED_OUTPATIENT_CLINIC_OR_DEPARTMENT_OTHER): Payer: Self-pay | Admitting: Cardiovascular Disease

## 2022-01-02 ENCOUNTER — Ambulatory Visit (INDEPENDENT_AMBULATORY_CARE_PROVIDER_SITE_OTHER): Payer: 59 | Admitting: Cardiovascular Disease

## 2022-01-02 ENCOUNTER — Ambulatory Visit (HOSPITAL_BASED_OUTPATIENT_CLINIC_OR_DEPARTMENT_OTHER): Payer: 59

## 2022-01-02 ENCOUNTER — Ambulatory Visit (HOSPITAL_BASED_OUTPATIENT_CLINIC_OR_DEPARTMENT_OTHER)
Admission: RE | Admit: 2022-01-02 | Discharge: 2022-01-02 | Disposition: A | Discharge status: Home / Self Care | Payer: 59 | Source: Ambulatory Visit | Attending: Cardiovascular Disease | Admitting: Cardiovascular Disease

## 2022-01-02 VITALS — BP 130/78 | HR 56 | Ht 72.0 in | Wt 199.2 lb

## 2022-01-02 DIAGNOSIS — I872 Venous insufficiency (chronic) (peripheral): Secondary | ICD-10-CM | POA: Diagnosis not present

## 2022-01-02 DIAGNOSIS — I82409 Acute embolism and thrombosis of unspecified deep veins of unspecified lower extremity: Secondary | ICD-10-CM | POA: Insufficient documentation

## 2022-01-02 DIAGNOSIS — I82451 Acute embolism and thrombosis of right peroneal vein: Secondary | ICD-10-CM | POA: Insufficient documentation

## 2022-01-02 DIAGNOSIS — R0602 Shortness of breath: Secondary | ICD-10-CM

## 2022-01-02 HISTORY — DX: Acute embolism and thrombosis of unspecified deep veins of unspecified lower extremity: I82.409

## 2022-01-02 MED ORDER — RIVAROXABAN 20 MG PO TABS: 20.0000 mg | ORAL_TABLET | Freq: Every day | ORAL | 1 refills | Status: DC

## 2022-01-02 MED ORDER — IOHEXOL 350 MG/ML SOLN
100.0000 mL | Freq: Once | INTRAVENOUS | Status: AC | PRN
  Administered 2022-01-02: 75 mL via INTRAVENOUS

## 2022-01-02 MED ORDER — RIVAROXABAN (XARELTO) VTE STARTER PACK (15 & 20 MG): ORAL_TABLET | ORAL | 0 refills | Status: DC

## 2022-01-02 NOTE — Progress Notes (Signed)
Cardiology Office Note   Date:  01/02/2022   ID:  Cody Barnes, DOB 05-28-1961, MRN [id number]  PCP:  Etta Grandchild, MD  Cardiologist:   Chilton Si, MD   No chief complaint on file.    History of Present Illness: Cody Barnes is a 61 y.o. male with bioprosthetic aortic valve replacement (2015) and hyperlipidemiaith mitral valve repair,who is being seen acutely today for the evaluation of DVT at the request of Etta Grandchild, MD.  She last saw Cody Barnes, [place] 08/2021 for preoperative risk assessment prior to foot surgery.  At that time he was stable and required no further testing.  He had surgery on his Achilles tendon with Dr. Al Corpus on 10/07/2021.  He recovered well from the foot but his swelling never improved.  At his post-op visit LE Dopplers were ordered.  He came to our office today for imaging and was noted to have a DVT.  He notes that he has been more short of breath when walking.  He denies any chest pain or pressure.  He has no orthopnea or PND.  He continues to walk 8-10,000 steps daily without exertional symptoms.   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    Past Surgical History:  Procedure Laterality Date   AORTIC VALVE REPLACEMENT     HIP FRACTURE SURGERY     left   INGUINAL HERNIA REPAIR     as infant   LEFT AND RIGHT HEART CATHETERIZATION WITH CORONARY ANGIOGRAM N/A 12/12/2013   Procedure: LEFT AND RIGHT HEART CATHETERIZATION WITH CORONARY ANGIOGRAM;  Surgeon: Corky Crafts, MD;  Location: [facility] CATH LAB;  Service: Cardiovascular;  Laterality: N/A;   MITRAL VALVE REPLACEMENT     OPEN REDUCTION INTERNAL FIXATION (ORIF) METACARPAL Left 01/25/2021   Procedure: Left  index finger open reduction internal fixation metacarpal fracture and manipulation under anesthesia middle finger base fracture;  Surgeon: Dominica Severin, MD;  Location: [facility] OR;  Service: Orthopedics;  Laterality: Left;  1hr Block with IV Sedation   TEE WITHOUT CARDIOVERSION N/A 11/25/2013   Procedure: TRANSESOPHAGEAL ECHOCARDIOGRAM (TEE);  Surgeon: Quintella Reichert, MD;  Location: [facility] ENDOSCOPY;  Service: Cardiovascular;  Laterality: N/A;     Current Outpatient Medications  Medication Sig Dispense Refill   atorvastatin (LIPITOR) 20 MG tablet TAKE 1 TABLET BY MOUTH EVERY DAY 90 tablet 2   fexofenadine (ALLEGRA) 180 MG tablet Take 180 mg by mouth daily as needed for allergies or rhinitis.     Multiple Vitamins-Minerals (ADULT GUMMY) CHEW Chew 2 capsules by mouth daily.     No current facility-administered medications for this visit.    Allergies:   Patient has no known allergies.    Social History:  The patient  reports that he has never smoked. He has never been exposed to tobacco smoke. He has never used smokeless tobacco. He reports current alcohol use of about 6.0 standard drinks of alcohol per week. He reports that he does not use drugs.   Family History:  The patient's family history includes Breast cancer (age of onset: 59) in his mother; Liver cancer (age of onset: 54) in his mother.  ROS:  Please see the history of present illness.   Otherwise, review of systems are positive for none.   All other systems are reviewed and negative.    PHYSICAL EXAM: VS:  BP 130/78 (BP Location: Left Arm, Patient Position: Sitting, Cuff Size: Normal)   Pulse (!) 56   Ht 6' (1.829 m)   Wt 199 lb 3.2 oz (90.4 kg)   BMI 27.02 kg/m  , BMI Body mass index is 27.02 kg/m. GENERAL:  Well appearing HEENT:  Pupils equal round and reactive, fundi not visualized, oral mucosa unremarkable NECK:  No jugular venous distention, waveform within normal limits, carotid upstroke brisk and symmetric, no bruits,  no thyromegaly LUNGS:  Clear to auscultation bilaterally HEART:  RRR.  PMI not displaced or sustained,S1 and S2 within normal limits, no S3, no S4, no clicks, no rubs, III/VI systolic murmur at the LUSB ABD:  Flat, positive bowel sounds normal in frequency in pitch, no bruits, no rebound, no guarding, no midline pulsatile mass, no hepatomegaly, no splenomegaly EXT:  2 plus pulses throughout, 2+ R LE edema to the calf no cyanosis no clubbing SKIN:  No rashes no nodules NEURO:  Cranial nerves II through XII grossly intact, motor grossly intact throughout PSYCH:  Cognitively intact, oriented to person place and time  EKG:  EKG is ordered today. The ekg ordered today demonstrates sinus bradycardia.  Rate 56 bpm.    Recent Labs: 11/30/2021: ALT 47; BUN 21; Creatinine, Ser 1.21; Hemoglobin 14.5; Platelets 195.0; Potassium 4.2; Sodium 139; TSH 1.71    Lipid Panel    Component Value Date/Time   CHOL 163 11/30/2021 0932   CHOL 114 10/04/2016 0757   TRIG 101.0 11/30/2021 0932   HDL 46.30 11/30/2021 0932   HDL 41 10/04/2016 0757   CHOLHDL 4 11/30/2021 0932   VLDL 20.2 11/30/2021 0932   LDLCALC 96 11/30/2021 0932   LDLCALC 61 10/04/2016 0757      Wt Readings from Last 3 Encounters:  01/02/22 199 lb 3.2 oz (90.4 kg)  11/30/21 197 lb (89.4 kg)  08/31/21 194 lb (88 kg)      ASSESSMENT AND PLAN:  DVT (deep venous thrombosis) (HCC) Patient has an acute DVT in the postoperative state.  Given that he does have some shortness of breath we will get a chest CT with contrast to rule out PE.  Start Xarelto 15 mg twice daily x 21 days followed by 20 mg daily for total of 3 months.  He understands that if his symptoms get acutely worse or if he becomes more short of breath he should go to the ED.    Current medicines are reviewed at length with the patient today.  The patient does not have concerns regarding medicines.  The following changes have been made:  start Xarelto  Labs/ tests ordered  today include:  No orders of the defined types were placed in this encounter.    Disposition:   FU with Dr. Abe People as scheduled     Signed, Lantz Hermann C. Duke Salvia, MD, [facility]  01/02/2022 9:30 AM    [facility]

## 2022-01-02 NOTE — Patient Instructions (Addendum)
Medication Instructions:  START XARELTO 15 MG TWICE A DAY FOR 21 DAYS AND THEN START XARELTO 20 MG IN THE EVENING DAILY   *If you need a refill on your cardiac medications before your next appointment, please call your pharmacy*  Lab Work: NONE  Testing/Procedures: Non-Cardiac CT scanning, (CAT scanning), is a noninvasive, special x-ray that produces cross-sectional images of the body using x-rays and a computer. CT scans help physicians diagnose and treat medical conditions. For some CT exams, a contrast material is used to enhance visibility in the area of the body being studied. CT scans provide greater clarity and reveal more details than regular x-ray exams.  A chest x-ray takes a picture of the organs and structures inside the chest, including the heart, lungs, and blood vessels. This test can show several things, including, whether the heart is enlarges; whether fluid is building up in the lungs; and whether pacemaker / defibrillator leads are still in place.  ABOVE TO BE DONE SOON   Follow-Up: KEEP FOLLOW UP 8/17 AS SCHEDULED WITH DR Eldridge Dace

## 2022-01-02 NOTE — Assessment & Plan Note (Addendum)
Patient has an acute DVT in the postoperative state.  Given that he does have some shortness of breath we will get a chest CT with contrast to rule out PE.  Start Xarelto 15 mg twice daily x 21 days followed by 20 mg daily for total of 3 months.  He understands that if his symptoms get acutely worse or if he becomes more short of breath he should go to the ED.

## 2022-01-05 ENCOUNTER — Encounter: Payer: Self-pay | Admitting: Internal Medicine

## 2022-01-05 ENCOUNTER — Ambulatory Visit (INDEPENDENT_AMBULATORY_CARE_PROVIDER_SITE_OTHER): Payer: 59 | Admitting: Internal Medicine

## 2022-01-05 VITALS — BP 132/82 | HR 63 | Temp 97.5°F | Resp 16 | Ht 72.0 in | Wt 198.0 lb

## 2022-01-05 DIAGNOSIS — I10 Essential (primary) hypertension: Secondary | ICD-10-CM | POA: Diagnosis not present

## 2022-01-05 DIAGNOSIS — I82461 Acute embolism and thrombosis of right calf muscular vein: Secondary | ICD-10-CM

## 2022-01-05 DIAGNOSIS — R918 Other nonspecific abnormal finding of lung field: Secondary | ICD-10-CM

## 2022-01-05 NOTE — Progress Notes (Signed)
Subjective:  Patient ID: Cody Barnes, male    DOB: 1961-03-06  Age: 61 y.o. MRN: [id number]  CC: Possible Pneumonia   HPI Cody Barnes presents for f/up -  I was forwarded a copy of a chest x-ray from a cardiologist which showed patchy infiltrates on the right side.  I asked him to come in today for follow-up.  He had a CT angio of the chest was normal.  This was done because of some shortness of breath and a right lower extremity DVT.  He is fully anticoagulated and said the shortness of breath is improving.  He denies cough, chest pain, fever, chills, or night sweats.  Outpatient Medications Prior to Visit  Medication Sig Dispense Refill   atorvastatin (LIPITOR) 20 MG tablet TAKE 1 TABLET BY MOUTH EVERY DAY 90 tablet 2   fexofenadine (ALLEGRA) 180 MG tablet Take 180 mg by mouth daily as needed for allergies or rhinitis.     Multiple Vitamins-Minerals (ADULT GUMMY) CHEW Chew 2 capsules by mouth daily.     rivaroxaban (XARELTO) 20 MG TABS tablet Take 1 tablet (20 mg total) by mouth daily with supper. START WHEN YOU COMPLETE THE STARTER PACK 30 tablet 1   RIVAROXABAN (XARELTO) VTE STARTER PACK (15 & 20 MG) Follow package directions: Take one 15mg  tablet by mouth twice a day. On day 22, switch to one 20mg  tablet once a day. Take with food. 51 each 0   No facility-administered medications prior to visit.    ROS Review of Systems  Constitutional:  Negative for chills, diaphoresis, fatigue and fever.  HENT: Negative.    Eyes: Negative.   Respiratory:  Positive for shortness of breath. Negative for cough, chest tightness and wheezing.   Cardiovascular:  Negative for chest pain, palpitations and leg swelling.  Gastrointestinal:  Negative for abdominal pain and diarrhea.  Genitourinary: Negative.   Musculoskeletal: Negative.   Skin: Negative.   Neurological: Negative.  Negative for dizziness, weakness, light-headedness and headaches.  Hematological:  Negative for adenopathy. Does  not bruise/bleed easily.  Psychiatric/Behavioral: Negative.      Objective:  BP 118/80   Pulse 63   Temp (!) 97.5 F (36.4 C) (Oral)   Ht 6' (1.829 m)   Wt 198 lb (89.8 kg)   SpO2 95%   BMI 26.85 kg/m   BP Readings from Last 3 Encounters:  01/05/22 118/80  01/02/22 130/78  11/30/21 136/78    Wt Readings from Last 3 Encounters:  01/05/22 198 lb (89.8 kg)  01/02/22 199 lb 3.2 oz (90.4 kg)  11/30/21 197 lb (89.4 kg)    Physical Exam Vitals reviewed.  Constitutional:      General: He is not in acute distress.    Appearance: He is not ill-appearing, toxic-appearing or diaphoretic.  HENT:     Nose: Nose normal.     Mouth/Throat:     Mouth: Mucous membranes are moist.  Eyes:     General: No scleral icterus.    Conjunctiva/sclera: Conjunctivae normal.  Cardiovascular:     Rate and Rhythm: Normal rate and regular rhythm.     Heart sounds: Murmur heard.     Systolic murmur is present with a grade of 2/6.     No gallop.  Pulmonary:     Effort: Pulmonary effort is normal.     Breath sounds: No stridor. No wheezing, rhonchi or rales.  Abdominal:     General: Abdomen is flat.     Palpations: There is no mass.  Tenderness: There is no abdominal tenderness. There is no guarding.     Hernia: No hernia is present.  Musculoskeletal:        General: Normal range of motion.     Cervical back: Neck supple.     Right lower leg: No edema.     Left lower leg: No edema.  Lymphadenopathy:     Cervical: No cervical adenopathy.  Skin:    General: Skin is warm and dry.  Neurological:     General: No focal deficit present.     Mental Status: He is alert.  Psychiatric:        Mood and Affect: Mood normal.        Behavior: Behavior normal.     Lab Results  Component Value Date   WBC 6.1 11/30/2021   HGB 14.5 11/30/2021   HCT 43.1 11/30/2021   PLT 195.0 11/30/2021   GLUCOSE 97 11/30/2021   CHOL 163 11/30/2021   TRIG 101.0 11/30/2021   HDL 46.30 11/30/2021   LDLCALC 96  11/30/2021   ALT 47 11/30/2021   AST 25 11/30/2021   NA 139 11/30/2021   K 4.2 11/30/2021   CL 105 11/30/2021   CREATININE 1.21 11/30/2021   BUN 21 11/30/2021   CO2 24 11/30/2021   TSH 1.71 11/30/2021   PSA 1.9 01/20/2021   INR 1.0 12/10/2013   HGBA1C 5.3 12/28/2015    VAS [place] LOWER EXTREMITY VENOUS REFLUX  Result Date: 01/02/2022  Lower Venous DVT Study Patient Name:  Cody Barnes  Date of Exam:   01/02/2022 Medical Rec #: [id number]       Accession #:    [id number] Date of Birth: March 15, 1961      Patient Gender: M Patient Age:   24 years Exam Location:  [facility] Procedure:      VAS [place] LOWER EXTREMITY VENOUS (DVT) Referring Phys: MAX HYATT --------------------------------------------------------------------------------  Indications: Swelling. Other Indications: Patient reported mid May having torn tendon repair surgery on                    his right leg. He reports constant swelling since surgery                    from below right knee to right ankle. Patient denies any                    chest pain. Patient reports mild shortness of breath since                    surgery however, shortness of breath doesn't hinder him from                    doing normal activities, such as walking dogs multiple times                    a day. Comparison Study: Previous lower venous 08/26/2021, reported Venous reflux is                   noted in the right greater saphenous vein in the distal                   calf. No evidence of DVT. Performing Technologist: Jeryl Columbia RDCS  Examination Guidelines: A complete evaluation includes B-mode imaging, spectral Doppler, color Doppler, and power Doppler as needed of all accessible portions of each vessel. Bilateral testing is considered an integral part  of a complete examination. Limited examinations for reoccurring indications may be performed as noted. The reflux portion of the exam is performed with the patient in reverse Trendelenburg.   +---------+---------------+---------+-----------+----------+--------------+ RIGHT    CompressibilityPhasicitySpontaneityPropertiesThrombus Aging +---------+---------------+---------+-----------+----------+--------------+ CFV      Full           Yes      Yes                                 +---------+---------------+---------+-----------+----------+--------------+ SFJ      Full           Yes      Yes                                 +---------+---------------+---------+-----------+----------+--------------+ FV Prox  Full           Yes      Yes                                 +---------+---------------+---------+-----------+----------+--------------+ FV Mid   Full           Yes      Yes                                 +---------+---------------+---------+-----------+----------+--------------+ FV DistalFull           Yes      Yes                                 +---------+---------------+---------+-----------+----------+--------------+ PFV      Full                                                        +---------+---------------+---------+-----------+----------+--------------+ POP      Full           Yes      Yes                                 +---------+---------------+---------+-----------+----------+--------------+ PTV      Full           Yes      Yes                                 +---------+---------------+---------+-----------+----------+--------------+ PERO     None           No       No                                  +---------+---------------+---------+-----------+----------+--------------+ Gastroc  Full                                                        +---------+---------------+---------+-----------+----------+--------------+  GSV      Full           Yes      Yes                                 +---------+---------------+---------+-----------+----------+--------------+    +----+---------------+---------+-----------+----------+--------------+ LEFTCompressibilityPhasicitySpontaneityPropertiesThrombus Aging +----+---------------+---------+-----------+----------+--------------+ CFV Full           Yes      Yes                                 +----+---------------+---------+-----------+----------+--------------+    Findings reported to Dr. Lyndon Code, PMAC at [facility]. at 900AM.  Summary: RIGHT: - Findings consistent with age indeterminate deep vein thrombosis involving the right peroneal veins. - No cystic structure found in the popliteal fossa. - Age indeterminate mid peronal DVT.  LEFT: - No evidence of common femoral vein obstruction.  *See table(s) above for measurements and observations. Electronically signed by Nanetta Batty MD on 01/02/2022 at 5:55:29 PM.    Final    DG Chest 2 View  Result Date: 01/02/2022 CLINICAL DATA:  Shortness of breath and acute DVT. EXAM: CHEST - 2 VIEW COMPARISON:  April 13 2021 FINDINGS: The heart size and mediastinal contours are stable. 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 Mild patchy opacities identified in the medial right lung base. There is no pulmonary edema or pleural effusion. The visualized skeletal structures are unremarkable. IMPRESSION: Mild patchy opacities identified in the medial right lung base, developing pneumonia is not excluded. Electronically Signed   By: Sherian Rein M.D.   On: 01/02/2022 15:16   CT Angio Chest Pulmonary Embolism (PE) W or WO Contrast  Result Date: 01/02/2022 CLINICAL DATA:  Shortness of breath. Recent ankle surgery. Assess for pulmonary embolus. EXAM: CT ANGIOGRAPHY CHEST WITH CONTRAST TECHNIQUE: Multidetector CT imaging of the chest was performed using the standard protocol during bolus administration of intravenous contrast. Multiplanar CT image reconstructions and MIPs were obtained to evaluate the vascular anatomy. RADIATION DOSE REDUCTION: This  exam was performed according to the departmental dose-optimization program which includes automated exposure control, adjustment of the mA and/or kV according to patient size and/or use of iterative reconstruction technique. CONTRAST:  79mL OMNIPAQUE IOHEXOL 350 MG/ML SOLN COMPARISON:  December 25, 2013 FINDINGS: Cardiovascular: Satisfactory opacification of the pulmonary arteries to the segmental level. No evidence of pulmonary embolism. Normal heart size. Aortic valvular replacement ring identified. Mitral valvular replacement ring noted. Ascending aorta measures 3.4 cm in diameter unchanged compared prior exam. No pericardial effusion. Mediastinum/Nodes: No enlarged mediastinal, hilar, or axillary lymph nodes. Thyroid gland, trachea, and esophagus demonstrate no significant findings. Lungs/Pleura: Lungs are clear. No pleural effusion or pneumothorax. Upper Abdomen: A 2.1 cm simple cyst is identified in the upper pole left kidney. No follow-up is necessary. The other visualized upper abdominal structures are unremarkable. Mild degenerative joint changes of the spine are noted. Musculoskeletal: No acute abnormality. Mild degenerative joint changes are noted. Review of the MIP images confirms the above findings. IMPRESSION: 1. No pulmonary embolus. 2. No acute abnormality identified in the chest. Electronically Signed   By: Sherian Rein M.D.   On: 01/02/2022 11:07    VAS [place] LOWER EXTREMITY VENOUS REFLUX  Result Date: 01/02/2022  Lower Venous DVT Study Patient Name:  Cody Barnes  Date of Exam:   01/02/2022 Medical Rec #: [id number]  Accession #:    [id number] Date of Birth: 04/16/1961      Patient Gender: M Patient Age:   57 years Exam Location:  [facility] Procedure:      VAS [place] LOWER EXTREMITY VENOUS (DVT) Referring Phys: MAX HYATT --------------------------------------------------------------------------------  Indications: Swelling. Other Indications: Patient reported mid May having torn tendon repair  surgery on                    his right leg. He reports constant swelling since surgery                    from below right knee to right ankle. Patient denies any                    chest pain. Patient reports mild shortness of breath since                    surgery however, shortness of breath doesn't hinder him from                    doing normal activities, such as walking dogs multiple times                    a day. Comparison Study: Previous lower venous 08/26/2021, reported Venous reflux is                   noted in the right greater saphenous vein in the distal                   calf. No evidence of DVT. Performing Technologist: Jeryl Columbia RDCS  Examination Guidelines: A complete evaluation includes B-mode imaging, spectral Doppler, color Doppler, and power Doppler as needed of all accessible portions of each vessel. Bilateral testing is considered an integral part of a complete examination. Limited examinations for reoccurring indications may be performed as noted. The reflux portion of the exam is performed with the patient in reverse Trendelenburg.  +---------+---------------+---------+-----------+----------+--------------+ RIGHT    CompressibilityPhasicitySpontaneityPropertiesThrombus Aging +---------+---------------+---------+-----------+----------+--------------+ CFV      Full           Yes      Yes                                 +---------+---------------+---------+-----------+----------+--------------+ SFJ      Full           Yes      Yes                                 +---------+---------------+---------+-----------+----------+--------------+ FV Prox  Full           Yes      Yes                                 +---------+---------------+---------+-----------+----------+--------------+ FV Mid   Full           Yes      Yes                                 +---------+---------------+---------+-----------+----------+--------------+ FV DistalFull            Yes  Yes                                 +---------+---------------+---------+-----------+----------+--------------+ PFV      Full                                                        +---------+---------------+---------+-----------+----------+--------------+ POP      Full           Yes      Yes                                 +---------+---------------+---------+-----------+----------+--------------+ PTV      Full           Yes      Yes                                 +---------+---------------+---------+-----------+----------+--------------+ PERO     None           No       No                                  +---------+---------------+---------+-----------+----------+--------------+ Gastroc  Full                                                        +---------+---------------+---------+-----------+----------+--------------+ GSV      Full           Yes      Yes                                 +---------+---------------+---------+-----------+----------+--------------+   +----+---------------+---------+-----------+----------+--------------+ LEFTCompressibilityPhasicitySpontaneityPropertiesThrombus Aging +----+---------------+---------+-----------+----------+--------------+ CFV Full           Yes      Yes                                 +----+---------------+---------+-----------+----------+--------------+    Findings reported to Dr. Lyndon Code, PMAC at [facility]. at 900AM.  Summary: RIGHT: - Findings consistent with age indeterminate deep vein thrombosis involving the right peroneal veins. - No cystic structure found in the popliteal fossa. - Age indeterminate mid peronal DVT.  LEFT: - No evidence of common femoral vein obstruction.  *See table(s) above for measurements and observations. Electronically signed by Nanetta Batty MD on 01/02/2022 at 5:55:29 PM.    Final    DG Chest 2 View  Result Date: 01/02/2022 CLINICAL  DATA:  Shortness of breath and acute DVT. EXAM: CHEST - 2 VIEW COMPARISON:  April 13 2021 FINDINGS: The heart size and mediastinal contours are stable. 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 Mild patchy opacities identified in the medial right lung base. There is no pulmonary edema or pleural effusion. The visualized skeletal structures are unremarkable. IMPRESSION: Mild patchy opacities  identified in the medial right lung base, developing pneumonia is not excluded. Electronically Signed   By: Sherian Rein M.D.   On: 01/02/2022 15:16   CT Angio Chest Pulmonary Embolism (PE) W or WO Contrast  Result Date: 01/02/2022 CLINICAL DATA:  Shortness of breath. Recent ankle surgery. Assess for pulmonary embolus. EXAM: CT ANGIOGRAPHY CHEST WITH CONTRAST TECHNIQUE: Multidetector CT imaging of the chest was performed using the standard protocol during bolus administration of intravenous contrast. Multiplanar CT image reconstructions and MIPs were obtained to evaluate the vascular anatomy. RADIATION DOSE REDUCTION: This exam was performed according to the departmental dose-optimization program which includes automated exposure control, adjustment of the mA and/or kV according to patient size and/or use of iterative reconstruction technique. CONTRAST:  109mL OMNIPAQUE IOHEXOL 350 MG/ML SOLN COMPARISON:  December 25, 2013 FINDINGS: Cardiovascular: Satisfactory opacification of the pulmonary arteries to the segmental level. No evidence of pulmonary embolism. Normal heart size. Aortic valvular replacement ring identified. Mitral valvular replacement ring noted. Ascending aorta measures 3.4 cm in diameter unchanged compared prior exam. No pericardial effusion. Mediastinum/Nodes: No enlarged mediastinal, hilar, or axillary lymph nodes. Thyroid gland, trachea, and esophagus demonstrate no significant findings. Lungs/Pleura: Lungs are clear. No pleural effusion or pneumothorax. Upper Abdomen: A 2.1 cm simple cyst is  identified in the upper pole left kidney. No follow-up is necessary. The other visualized upper abdominal structures are unremarkable. Mild degenerative joint changes of the spine are noted. Musculoskeletal: No acute abnormality. Mild degenerative joint changes are noted. Review of the MIP images confirms the above findings. IMPRESSION: 1. No pulmonary embolus. 2. No acute abnormality identified in the chest. Electronically Signed   By: Sherian Rein M.D.   On: 01/02/2022 11:07     Assessment & Plan:   Kongmeng was seen today for possible pneumonia.  Diagnoses and all orders for this visit:  Essential hypertension- His blood pressure is well-controlled.  Acute deep vein thrombosis (DVT) of calf muscle vein of right lower extremity (HCC)- Will continue the DOAC.  Abnormality of lung on chest x-ray- He has no symptoms of pneumonia.  The chest x-ray was abnormal but the CT angio was normal.  He will let me know if he develops any symptoms of pneumonia.   I have discontinued Jeannette How Rivaroxaban Stater Pack (15 mg and 20 mg). I am also having him maintain his fexofenadine, Adult Gummy, atorvastatin, and rivaroxaban.  No orders of the defined types were placed in this encounter.    Follow-up: No follow-ups on file.  Sanda Linger, MD

## 2022-01-11 NOTE — Progress Notes (Unsigned)
Cardiology Office Note   Date:  01/12/2022   ID:  Cody Barnes, DOB [birth year], MRN [id number]  PCP:  Etta Grandchild, MD    No chief complaint on file.  S/p MV repair, AVR  Wt Readings from Last 3 Encounters:  01/12/22 198 lb 3.2 oz (89.9 kg)  01/05/22 198 lb (89.8 kg)  01/02/22 199 lb 3.2 oz (90.4 kg)       History of Present Illness: Cody Barnes is a 61 y.o. male   who has had mitral valve prolapse for many years. He was followed with serial echoes. He was noted to have increased LV size at the time of his last echocardiogram in 2015 with some symptoms of dyspnea on exertion despite preserved LVEF. He was referred for surgery. He chose to go to [facility] clinic for his operation. He underwent mitral valve repair and aortic valve replacement due to aortic insufficiency as well. He chose to have a bioprosthetic aortic valve. Cardiac cath prior to the valve surgery was negative for significant coronary artery disease.   He was started on atorvastatin for elevated cholesterol. Of note, he had a mildly increased ALT before starting the medicine.  Had Bells palsy in 7/22 while in [place].  Fell in 8/22.  Had surgery on his left hand for fracture.   Saw oncology regarding cancer history.  Seen at [facility] in 8/23: " He had surgery on his Achilles tendon with Dr. Al Corpus on 10/07/2021.  He recovered well from the foot but his swelling never improved.  At his post-op visit LE Dopplers were ordered.  He came to our office today for imaging and was noted to have a DVT.  He notes that he has been more short of breath when walking.  He denies any chest pain or pressure...  Start Xarelto 15 mg twice daily x 21 days followed by 20 mg daily for total of 3 months. "  12/2021 CT showed: "No pulmonary embolus. 2. No acute abnormality identified in the chest."  Has done well on anticoagulation.   Denies : Chest pain. Dizziness. Leg edema. Nitroglycerin use. Orthopnea. Palpitations. Paroxysmal  nocturnal dyspnea. Shortness of breath. Syncope.    SHOB improved.  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    Past Surgical History:  Procedure Laterality Date   AORTIC VALVE REPLACEMENT     HIP FRACTURE SURGERY     left   INGUINAL HERNIA REPAIR     as infant   LEFT AND RIGHT HEART CATHETERIZATION WITH CORONARY ANGIOGRAM N/A 12/12/2013   Procedure: LEFT AND RIGHT HEART CATHETERIZATION WITH CORONARY ANGIOGRAM;  Surgeon: Corky Crafts, MD;  Location: [facility] CATH LAB;  Service: Cardiovascular;  Laterality: N/A;   MITRAL VALVE REPLACEMENT     OPEN REDUCTION INTERNAL FIXATION (ORIF) METACARPAL Left 01/25/2021   Procedure: Left index finger open reduction internal fixation metacarpal fracture and manipulation under anesthesia middle finger base fracture;  Surgeon: Dominica Severin, MD;  Location: [facility] OR;  Service: Orthopedics;  Laterality: Left;  1hr Block with IV Sedation   TEE WITHOUT CARDIOVERSION  N/A 11/25/2013   Procedure: TRANSESOPHAGEAL ECHOCARDIOGRAM (TEE);  Surgeon: Quintella Reichert, MD;  Location: [facility] ENDOSCOPY;  Service: Cardiovascular;  Laterality: N/A;     Current Outpatient Medications  Medication Sig Dispense Refill   atorvastatin (LIPITOR) 20 MG tablet TAKE 1 TABLET BY MOUTH EVERY DAY 90 tablet 2   fexofenadine (ALLEGRA) 180 MG tablet Take 180 mg by mouth daily as needed for allergies or rhinitis.     Multiple Vitamins-Minerals (ADULT GUMMY) CHEW Chew 2 capsules by mouth daily.     rivaroxaban (XARELTO) 20 MG TABS tablet Take 1 tablet (20 mg total) by mouth daily with supper. START WHEN YOU COMPLETE THE STARTER PACK 30  tablet 1   No current facility-administered medications for this visit.    Allergies:   Patient has no known allergies.    Social History:  The patient  reports that he has never smoked. He has never been exposed to tobacco smoke. He has never used smokeless tobacco. He reports current alcohol use of about 6.0 standard drinks of alcohol per week. He reports that he does not use drugs.   Family History:  The patient's family history includes Breast cancer (age of onset: 75) in his mother; Liver cancer (age of onset: 65) in his mother.    ROS:  Please see the history of present illness.   Otherwise, review of systems are positive for right ankle swelling.   All other systems are reviewed and negative.    PHYSICAL EXAM: VS:  BP 116/76   Pulse 69   Ht 6' (1.829 m)   Wt 198 lb 3.2 oz (89.9 kg)   SpO2 98%   BMI 26.88 kg/m  , BMI Body mass index is 26.88 kg/m. GEN: Well nourished, well developed, in no acute distress HEENT: normal Neck: no JVD, carotid bruits, or masses Cardiac: RRR; 2/6 systolic murmur,no  rubs, or gallops,; right ankle/leg edema  Respiratory:  clear to auscultation bilaterally, normal work of breathing GI: soft, nontender, nondistended, + BS MS: no deformity or atrophy Skin: warm and dry, no rash Neuro:  Strength and sensation are intact Psych: euthymic mood, full affect   EKG:   The ekg ordered 01/02/22 demonstrates NSR   Recent Labs: 11/30/2021: ALT 47; BUN 21; Creatinine, Ser 1.21; Hemoglobin 14.5; Platelets 195.0; Potassium 4.2; Sodium 139; TSH 1.71   Lipid Panel    Component Value Date/Time   CHOL 163 11/30/2021 0932   CHOL 114 10/04/2016 0757   TRIG 101.0 11/30/2021 0932   HDL 46.30 11/30/2021 0932   HDL 41 10/04/2016 0757   CHOLHDL 4 11/30/2021 0932   VLDL 20.2 11/30/2021 0932   LDLCALC 96 11/30/2021 0932   LDLCALC 61 10/04/2016 0757     Other studies Reviewed: Additional studies/ records that were reviewed today with results demonstrating:  Labs reviewed.   ASSESSMENT AND PLAN:  MV repair: No CHF sx. last echocardiogram in 2020 showed normal LV function and normally functioning mitral and aortic valves. AVR: needs SBE prophylaxis.  He typically gets this from the dentist prior to his cleanings, amoxicillin 2 g 1 hour prior. DVT: Now on Xarelto.  No bleeding problems.  Plan is for Xarelto for 3 months since DVT occurred in the setting of surgery.  Persistent ankle swelling.  Elevate legs.  Compression stockings will be helpful also. Hyperlipidemia: 2023 total cholesterol 163 LDL 96 HDL 46 triglycerides 101.  Well-controlled.  Continue healthy diet. Elevated LFTs: noted in the past but normalized.    Current medicines  are reviewed at length with the patient today.  The patient concerns regarding his medicines were addressed.  The following changes have been made:  No change  Labs/ tests ordered today include:  No orders of the defined types were placed in this encounter.   Recommend 150 minutes/week of aerobic exercise Low fat, low carb, high fiber diet recommended  Disposition:   FU in 6 months-    Signed, Lance Muss, MD  01/12/2022 8:20 AM    [facility] [address] [place], [place], [place]  [address] Phone: [phone]; Fax: [phone]

## 2022-01-12 ENCOUNTER — Ambulatory Visit: Payer: 59 | Admitting: Interventional Cardiology

## 2022-01-12 ENCOUNTER — Encounter: Payer: Self-pay | Admitting: Interventional Cardiology

## 2022-01-12 VITALS — BP 116/76 | HR 69 | Ht 72.0 in | Wt 198.2 lb

## 2022-01-12 DIAGNOSIS — I351 Nonrheumatic aortic (valve) insufficiency: Secondary | ICD-10-CM | POA: Diagnosis not present

## 2022-01-12 DIAGNOSIS — I340 Nonrheumatic mitral (valve) insufficiency: Secondary | ICD-10-CM

## 2022-01-12 DIAGNOSIS — E785 Hyperlipidemia, unspecified: Secondary | ICD-10-CM | POA: Diagnosis not present

## 2022-01-12 DIAGNOSIS — I82451 Acute embolism and thrombosis of right peroneal vein: Secondary | ICD-10-CM

## 2022-01-12 DIAGNOSIS — Z953 Presence of xenogenic heart valve: Secondary | ICD-10-CM

## 2022-01-12 DIAGNOSIS — R600 Localized edema: Secondary | ICD-10-CM

## 2022-01-12 NOTE — Patient Instructions (Signed)
Medication Instructions:  Your physician recommends that you continue on your current medications as directed. Please refer to the Current Medication list given to you today.  *If you need a refill on your cardiac medications before your next appointment, please call your pharmacy*  Follow-Up: At [facility], you and your health needs are our priority.  As part of our continuing mission to provide you with exceptional heart care, we have created designated Provider Care Teams.  These Care Teams include your primary Cardiologist (physician) and Advanced Practice Providers (APPs -  Physician Assistants and Nurse Practitioners) who all work together to provide you with the care you need, when you need it.   Your next appointment:   6 month(s)  The format for your next appointment:   In Person  Provider:   Lance Muss, MD {   Important Information About Sugar

## 2022-01-16 NOTE — Progress Notes (Unsigned)
[facility] [place]  ASSESSMENT / PLAN: Cody Barnes is a 61 y.o. male with chronic venous insufficiency of *** lower extremity causing *** (C*** disease).  Venous duplex is significant for *** Recommend compression and elevation for symptomatic relief. ***Follow up with me in three months to discuss saphenous vein ablation. ***Follow up with me as needed.   CHIEF COMPLAINT: ***  HISTORY OF PRESENT ILLNESS: Cody Barnes is a 61 y.o. male ***  VASCULAR SURGICAL HISTORY: ***  VASCULAR RISK FACTORS: {FINDINGS; POSITIVE NEGATIVE:[phone]} history of stroke / transient ischemic attack. {FINDINGS; POSITIVE NEGATIVE:[phone]} history of coronary artery disease. *** history of PCI. *** history of CABG.  {FINDINGS; POSITIVE NEGATIVE:[phone]} history of diabetes mellitus. Last A1c ***. {FINDINGS; POSITIVE NEGATIVE:[phone]} history of smoking. *** actively smoking. {FINDINGS; POSITIVE NEGATIVE:[phone]} history of hypertension. *** drug regimen with *** control. {FINDINGS; POSITIVE NEGATIVE:[phone]} history of chronic kidney disease.  Last GFR ***. CKD {stage:30421363}. {FINDINGS; POSITIVE NEGATIVE:[phone]} history of chronic obstructive pulmonary disease, treated with ***.  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    Past Surgical History:  Procedure Laterality Date   AORTIC VALVE REPLACEMENT     HIP FRACTURE SURGERY     left   INGUINAL HERNIA REPAIR     as  infant   LEFT AND RIGHT HEART CATHETERIZATION WITH CORONARY ANGIOGRAM N/A 12/12/2013   Procedure: LEFT AND RIGHT HEART CATHETERIZATION WITH CORONARY ANGIOGRAM;  Surgeon: Corky Crafts, MD;  Location: [facility] CATH LAB;  Service: Cardiovascular;  Laterality: N/A;   MITRAL VALVE REPLACEMENT     OPEN REDUCTION INTERNAL FIXATION (ORIF) METACARPAL Left 01/25/2021   Procedure: Left index finger open reduction internal fixation metacarpal fracture and manipulation under anesthesia middle finger base fracture;  Surgeon: Dominica Severin, MD;  Location: [facility] OR;  Service: Orthopedics;  Laterality: Left;  1hr Block with IV Sedation   TEE WITHOUT CARDIOVERSION N/A 11/25/2013   Procedure: TRANSESOPHAGEAL ECHOCARDIOGRAM (TEE);  Surgeon: Quintella Reichert, MD;  Location: [facility] ENDOSCOPY;  Service: Cardiovascular;  Laterality: N/A;    Family History  Problem Relation Age of Onset   Breast cancer Mother 4   Liver cancer Mother 37   Drug abuse Neg Hx    Early death Neg Hx    Heart disease Neg Hx    Hyperlipidemia Neg Hx    Hypertension Neg Hx    Kidney disease Neg Hx    Stroke Neg Hx    Arthritis Neg Hx     Social History   Socioeconomic History   Marital status: Married    Spouse name: Not on file   Number of children: Not on file   Years of education: Not on file   Highest education level: Not on file  Occupational History   Not on file  Tobacco Use   Smoking status: Never    Passive exposure: Never   Smokeless tobacco: Never  Substance and Sexual Activity   Alcohol use: Yes    Alcohol/week: 6.0 standard drinks of alcohol  Types: 5 Cans of beer, 1 Shots of liquor per week    Comment: Last drink Friday 01/21/21   Drug use: No   Sexual activity: Yes    Partners: Female  Other Topics Concern   Not on file  Social History Narrative   Not on file   Social Determinants of Health   Financial Resource Strain: Not on file  Food Insecurity: Not on file  Transportation Needs: Not on file   Physical Activity: Not on file  Stress: Not on file  Social Connections: Not on file  Intimate Partner Violence: Not on file    No Known Allergies  Current Outpatient Medications  Medication Sig Dispense Refill   atorvastatin (LIPITOR) 20 MG tablet TAKE 1 TABLET BY MOUTH EVERY DAY 90 tablet 2   fexofenadine (ALLEGRA) 180 MG tablet Take 180 mg by mouth daily as needed for allergies or rhinitis.     Multiple Vitamins-Minerals (ADULT GUMMY) CHEW Chew 2 capsules by mouth daily.     rivaroxaban (XARELTO) 20 MG TABS tablet Take 1 tablet (20 mg total) by mouth daily with supper. START WHEN YOU COMPLETE THE STARTER PACK 30 tablet 1   No current facility-administered medications for this visit.    PHYSICAL EXAM There were no vitals filed for this visit.  Constitutional: *** appearing. *** distress. Appears *** nourished.  Neurologic: CN ***. *** focal findings. *** sensory loss. Psychiatric: *** Mood and affect symmetric and appropriate. Eyes: *** No icterus. No conjunctival pallor. Ears, nose, throat: *** mucous membranes moist. Midline trachea.  Cardiac: *** rate and rhythm.  Respiratory: *** unlabored. Abdominal: *** soft, non-tender, non-distended.  Peripheral vascular: *** Extremity: *** edema. *** cyanosis. *** pallor.  Skin: *** gangrene. *** ulceration.  Lymphatic: *** Stemmer's sign. *** palpable lymphadenopathy.    PERTINENT LABORATORY AND RADIOLOGIC DATA  Most recent CBC    Latest Ref Rng & Units 11/30/2021    9:32 AM 01/25/2021    3:19 PM 12/04/2020   12:00 AM  CBC  WBC 4.0 - 10.5 K/uL 6.1  6.8  9.8      Hemoglobin 13.0 - 17.0 g/dL 85.6  31.4  97.0      Hematocrit 39.0 - 52.0 % 43.1  46.2  48      Platelets 150.0 - 400.0 K/uL 195.0  194  203         This result is from an external source.     Most recent CMP    Latest Ref Rng & Units 11/30/2021    9:32 AM 12/04/2020   12:00 AM 11/11/2020    8:58 AM  CMP  Glucose 70 - 99 mg/dL 97   94   BUN 6 - 23 mg/dL 21  18      20    Creatinine 0.40 - 1.50 mg/dL  1.1     2.63   Sodium 135 - 145 mEq/L 139  138     139   Potassium 3.5 - 5.1 mEq/L 4.2  5.0     4.7   Chloride 96 - 112 mEq/L 105  106     104   CO2 19 - 32 mEq/L 24  23     28    Calcium 8.4 - 10.5 mg/dL 9.2  9.7     9.4   Total Protein 6.0 - 8.3 g/dL 6.8   6.6   Total Bilirubin 0.2 - 1.2 mg/dL 0.6   0.7   Alkaline Phos 39 - 117 U/L 109  86   AST 0 - 37 U/L 25   19   ALT 0 - 53 U/L 47   30      This result is from an external source.    Renal function CrCl cannot be calculated (Patient's most recent lab result is older than the maximum 21 days allowed.).  Hgb A1c MFr Bld (%)  Date Value  12/28/2015 5.3    LDL Calculated  Date Value Ref Range Status  10/04/2016 61 0 - 99 mg/dL Final   LDL Cholesterol  Date Value Ref Range Status  11/30/2021 96 0 - 99 mg/dL Final     Vascular Imaging: ***  Cody Barnes N. Lenell Antu, MD [facility] Phone Number: [phone] 01/16/2022 5:16 PM  Total time spent on preparing this encounter including chart review, data review, collecting history, examining the patient, coordinating care for this {tnhtimebilling:26202}  Portions of this report may have been transcribed using voice recognition software.  Every effort has been made to ensure accuracy; however, inadvertent computerized transcription errors may still be present.

## 2022-01-17 ENCOUNTER — Ambulatory Visit: Payer: 59 | Admitting: Vascular Surgery

## 2022-01-17 ENCOUNTER — Encounter: Payer: Self-pay | Admitting: Vascular Surgery

## 2022-01-17 VITALS — BP 116/78 | HR 68 | Temp 99.8°F | Resp 20 | Ht 72.0 in | Wt 195.6 lb

## 2022-01-17 DIAGNOSIS — R609 Edema, unspecified: Secondary | ICD-10-CM | POA: Diagnosis not present

## 2022-01-25 ENCOUNTER — Telehealth (HOSPITAL_BASED_OUTPATIENT_CLINIC_OR_DEPARTMENT_OTHER): Payer: Self-pay | Admitting: Cardiovascular Disease

## 2022-01-25 DIAGNOSIS — I82451 Acute embolism and thrombosis of right peroneal vein: Secondary | ICD-10-CM

## 2022-01-25 MED ORDER — RIVAROXABAN 20 MG PO TABS: 20.0000 mg | ORAL_TABLET | Freq: Every day | ORAL | 1 refills | Status: DC

## 2022-01-25 NOTE — Telephone Encounter (Signed)
*  STAT* If patient is at the pharmacy, call can be transferred to refill team.   1. Which medications need to be refilled? (please list name of each medication and dose if known) Xarelto  2. Which pharmacy/location (including street and city if local pharmacy) is medication to be sent to? [facility] [address] [place]  3. Do they need a 30 day or 90 day supply? 60 days

## 2022-01-25 NOTE — Telephone Encounter (Signed)
Looks like pt will continue following with Dr. Eldridge Dace. Ok to refill Xarelto?

## 2022-05-10 ENCOUNTER — Other Ambulatory Visit (HOSPITAL_BASED_OUTPATIENT_CLINIC_OR_DEPARTMENT_OTHER): Payer: Self-pay | Admitting: Cardiovascular Disease

## 2022-05-10 DIAGNOSIS — I82451 Acute embolism and thrombosis of right peroneal vein: Secondary | ICD-10-CM

## 2022-05-10 NOTE — Telephone Encounter (Addendum)
Xarelto 20mg  refill request received. Pt is 61 years old, weight-88.7kg, Crea-1.21 on 11/30/2021, last seen by Dr. 01/31/2022 on 01/12/22, Diagnosis-DVT, CrCl- 79.41 mL/min; Dose is appropriate based on dosing criteria. Will send in refill to requested pharmacy.     Per OV note Start Xarelto 15 mg twice daily x 21 days followed by 20 mg daily for total of 3 months, pt should be close to finishing will send in limited supply and no refills.

## 2022-05-10 NOTE — Telephone Encounter (Signed)
Please review for refill. Thank you! 

## 2022-07-11 NOTE — Progress Notes (Signed)
Cardiology Office Note   Date:  07/13/2022   ID:  Cody Barnes, DOB 01-Apr-1961, MRN [id number]  PCP:  Etta Grandchild, MD    No chief complaint on file.  MV and Ao valve disease  Wt Readings from Last 3 Encounters:  07/13/22 197 lb 12.8 oz (89.7 kg)  01/17/22 195 lb 9.6 oz (88.7 kg)  01/12/22 198 lb 3.2 oz (89.9 kg)       History of Present Illness: Cody Barnes is a 62 y.o. male  who has had mitral valve prolapse for many years. He was followed with serial echoes. He was noted to have increased LV size at the time of his last echocardiogram in 2015 with some symptoms of dyspnea on exertion despite preserved LVEF. He was referred for surgery. He chose to go to [facility] clinic for his operation. He underwent mitral valve repair and aortic valve replacement due to aortic insufficiency as well. He chose to have a bioprosthetic aortic valve. Cardiac cath prior to the valve surgery was negative for significant coronary artery disease.   He was started on atorvastatin for elevated cholesterol. Of note, he had a mildly increased ALT before starting the medicine.   Had Bells palsy in 7/22 while in [place].   Fell in 8/22.  Had surgery on his left hand for fracture.   Saw oncology regarding cancer history.   Seen at [facility] in 8/23: " He had surgery on his Achilles tendon with Dr. Al Corpus on 10/07/2021.  He recovered well from the foot but his swelling never improved.  At his post-op visit LE Dopplers were ordered.  He came to our office today for imaging and was noted to have a DVT.  He notes that he has been more short of breath when walking.  He denies any chest pain or pressure...  Start Xarelto 15 mg twice daily x 21 days followed by 20 mg daily for total of 3 months. "   12/2021 CT showed: "No pulmonary embolus. 2. No acute abnormality identified in the chest."   Has done well on anticoagulation.    Denies : Chest pain. Dizziness. Leg edema. Nitroglycerin use. Orthopnea.  Palpitations. Paroxysmal nocturnal dyspnea.  Syncope.    Not exercising as much due to the ankle injury.     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    Past Surgical History:  Procedure Laterality Date   AORTIC VALVE REPLACEMENT     HIP FRACTURE SURGERY     left   INGUINAL HERNIA REPAIR     as infant   LEFT AND RIGHT HEART CATHETERIZATION WITH CORONARY ANGIOGRAM N/A 12/12/2013   Procedure: LEFT AND RIGHT HEART CATHETERIZATION WITH CORONARY ANGIOGRAM;  Surgeon: Corky Crafts, MD;  Location: [facility] CATH LAB;  Service: Cardiovascular;  Laterality: N/A;   MITRAL VALVE REPLACEMENT     OPEN REDUCTION INTERNAL FIXATION (ORIF) METACARPAL Left 01/25/2021   Procedure: Left index finger open reduction internal fixation metacarpal fracture and manipulation under anesthesia middle finger base fracture;  Surgeon: Dominica Severin, MD;  Location: [facility] OR;  Service: Orthopedics;  Laterality: Left;  1hr Block with IV Sedation   TEE WITHOUT CARDIOVERSION N/A 11/25/2013   Procedure: TRANSESOPHAGEAL ECHOCARDIOGRAM (TEE);  Surgeon: Quintella Reichert, MD;  Location: [facility] ENDOSCOPY;  Service: Cardiovascular;  Laterality: N/A;     Current Outpatient Medications  Medication Sig Dispense Refill   amoxicillin (AMOXIL) 500 MG capsule Take  prior to dentist appointment.     atorvastatin (LIPITOR) 20 MG tablet TAKE 1 TABLET BY MOUTH EVERY DAY (Patient taking differently: 3 capsules daily) 90 tablet 2   fexofenadine (ALLEGRA) 180 MG tablet Take 180 mg by mouth daily as needed for allergies or rhinitis.     Multiple Vitamins-Minerals (ADULT GUMMY) CHEW Chew 2  capsules by mouth daily.     Omega-3 Fatty Acids (FISH OIL) 1000 MG CAPS      rivaroxaban (XARELTO) 20 MG TABS tablet Take 1 tablet (20 mg total) by mouth daily with supper. 30 tablet 0   No current facility-administered medications for this visit.    Allergies:   Patient has no known allergies.    Social History:  The patient  reports that he has never smoked. He has never been exposed to tobacco smoke. He has never used smokeless tobacco. He reports current alcohol use of about 6.0 standard drinks of alcohol per week. He reports that he does not use drugs.   Family History:  The patient's family history includes Breast cancer (age of onset: 67) in his mother; Liver cancer (age of onset: 53) in his mother.    ROS:  Please see the history of present illness.   Otherwise, review of systems are positive for decreased exercise.   All other systems are reviewed and negative.    PHYSICAL EXAM: VS:  BP 114/72   Pulse 69   Ht 6' (1.829 m)   Wt 197 lb 12.8 oz (89.7 kg)   SpO2 96%   BMI 26.83 kg/m  , BMI Body mass index is 26.83 kg/m. GEN: Well nourished, well developed, in no acute distress HEENT: normal Neck: no JVD, carotid bruits, or masses Cardiac: RRR; 2/6 systolic murmur, no rubs, or gallops,no edema  Respiratory:  clear to auscultation bilaterally, normal work of breathing GI: soft, nontender, nondistended, + BS MS: no deformity or atrophy Skin: warm and dry, no rash Neuro:  Strength and sensation are intact Psych: euthymic mood, full affect   EKG:   The ekg ordered 8/23 demonstrates NSR, PRWP   Recent Labs: 11/30/2021: ALT 47; BUN 21; Creatinine, Ser 1.21; Hemoglobin 14.5; Platelets 195.0; Potassium 4.2; Sodium 139; TSH 1.71   Lipid Panel    Component Value Date/Time   CHOL 163 11/30/2021 0932   CHOL 114 10/04/2016 0757   TRIG 101.0 11/30/2021 0932   HDL 46.30 11/30/2021 0932   HDL 41 10/04/2016 0757   CHOLHDL 4 11/30/2021 0932   VLDL 20.2 11/30/2021 0932    LDLCALC 96 11/30/2021 0932   LDLCALC 61 10/04/2016 0757     Other studies Reviewed: Additional studies/ records that were reviewed today with results demonstrating: labs reviewed July 2023 LDL 96 HDL 46 triglycerides 101 creatinine 1.2.   ASSESSMENT AND PLAN:  MV repair: Some SHOB but exercising less.  No signs of volume overload.  No orthopnea.  Lower extremity edema has improved after his treatment for DVT. AVR: 9 years post AVR.   Will check echo.   DVT: LE edema as well.  Treated with Xarelto for 3 months.  Hyperlipidemia: Well-controlled.  Continue atorvastatin.  Healthy  diet.   Elevated LFTs: Improved at last check.   Current medicines are reviewed at length with the patient today.  The patient concerns regarding his medicines were addressed.  The following changes have been made:  No change  Labs/ tests ordered today include: echo No orders of the defined types were placed in this encounter.   Recommend 150 minutes/week of aerobic exercise Low fat, low carb, high fiber diet recommended  Disposition:   FU in 1 year   Signed, Lance Muss, MD  07/13/2022 8:29 AM    [facility] [address] [place], [place], [place]  [address] Phone: [phone]; Fax: [phone]

## 2022-07-13 ENCOUNTER — Ambulatory Visit: Payer: 59 | Attending: Interventional Cardiology | Admitting: Interventional Cardiology

## 2022-07-13 ENCOUNTER — Encounter: Payer: Self-pay | Admitting: Interventional Cardiology

## 2022-07-13 VITALS — BP 114/72 | HR 69 | Ht 72.0 in | Wt 197.8 lb

## 2022-07-13 DIAGNOSIS — Z953 Presence of xenogenic heart valve: Secondary | ICD-10-CM

## 2022-07-13 DIAGNOSIS — E785 Hyperlipidemia, unspecified: Secondary | ICD-10-CM | POA: Diagnosis not present

## 2022-07-13 DIAGNOSIS — I340 Nonrheumatic mitral (valve) insufficiency: Secondary | ICD-10-CM

## 2022-07-13 DIAGNOSIS — R7989 Other specified abnormal findings of blood chemistry: Secondary | ICD-10-CM

## 2022-07-13 DIAGNOSIS — I351 Nonrheumatic aortic (valve) insufficiency: Secondary | ICD-10-CM

## 2022-07-13 NOTE — Patient Instructions (Signed)
Medication Instructions:  Your physician recommends that you continue on your current medications as directed. Please refer to the Current Medication list given to you today.  *If you need a refill on your cardiac medications before your next appointment, please call your pharmacy*   Lab Work: none If you have labs (blood work) drawn today and your tests are completely normal, you will receive your results only by: [place] (if you have MyChart) OR A paper copy in the mail If you have any lab test that is abnormal or we need to change your treatment, we will call you to review the results.   Testing/Procedures: Your physician has requested that you have an echocardiogram. Echocardiography is a painless test that uses sound waves to create images of your heart. It provides your doctor with information about the size and shape of your heart and how well your heart's chambers and valves are working. This procedure takes approximately one hour. There are no restrictions for this procedure. Please do NOT wear cologne, perfume, aftershave, or lotions (deodorant is allowed). Please arrive 15 minutes prior to your appointment time.    Follow-Up: At [facility], you and your health needs are our priority.  As part of our continuing mission to provide you with exceptional heart care, we have created designated Provider Care Teams.  These Care Teams include your primary Cardiologist (physician) and Advanced Practice Providers (APPs -  Physician Assistants and Nurse Practitioners) who all work together to provide you with the care you need, when you need it.  We recommend signing up for the patient portal called "MyChart".  Sign up information is provided on this After Visit Summary.  MyChart is used to connect with patients for Virtual Visits (Telemedicine).  Patients are able to view lab/test results, encounter notes, upcoming appointments, etc.  Non-urgent messages can be sent to your  provider as well.   To learn more about what you can do with MyChart, go to [email or web address].    Your next appointment:   12 month(s)  Provider:   Larae Grooms, MD     Other Instructions

## 2022-08-08 ENCOUNTER — Ambulatory Visit (HOSPITAL_COMMUNITY): Payer: 59 | Attending: Interventional Cardiology

## 2022-08-08 ENCOUNTER — Telehealth: Payer: Self-pay | Admitting: *Deleted

## 2022-08-08 DIAGNOSIS — I351 Nonrheumatic aortic (valve) insufficiency: Secondary | ICD-10-CM | POA: Diagnosis not present

## 2022-08-08 DIAGNOSIS — I340 Nonrheumatic mitral (valve) insufficiency: Secondary | ICD-10-CM | POA: Diagnosis not present

## 2022-08-08 DIAGNOSIS — Z953 Presence of xenogenic heart valve: Secondary | ICD-10-CM | POA: Diagnosis not present

## 2022-08-08 LAB — ECHOCARDIOGRAM COMPLETE
AV Mean grad: 18 mmHg
AV Peak grad: 34.1 mmHg
Ao pk vel: 2.92 m/s
Area-P 1/2: 1.98 cm2
S' Lateral: 3.3 cm

## 2022-08-08 NOTE — Telephone Encounter (Signed)
-----   Message from Jettie Booze, MD sent at 08/08/2022 12:26 PM EDT ----- Normal LV/RV function. Normal prosthetic valvular function. If SHOB not improved, can check CBC, Bmet and BNP.

## 2022-08-08 NOTE — Telephone Encounter (Signed)
Left message to call office

## 2022-08-09 NOTE — Telephone Encounter (Signed)
Pt returning nurses call in regards to results, Pt would like a callback. Please advise.

## 2022-08-09 NOTE — Telephone Encounter (Signed)
Returned call to patient and discussed echo results.  Per Dr. Irish Lack: Normal LV/RV function. Normal prosthetic valvular function. If SHOB not improved, can check CBC, Bmet and BNP.   Patient reports SOB is tolerable and only when he is exerting himself (climbing stairs). He states it is not so bad that he has to stop and catch his breath.  Patient reports he feels fine currently, is happy with echo results, and will call if any new/worsening symptoms.  Patient expressed appreciation for follow-up.

## 2022-08-14 ENCOUNTER — Other Ambulatory Visit: Payer: Self-pay | Admitting: Interventional Cardiology

## 2022-08-14 DIAGNOSIS — E785 Hyperlipidemia, unspecified: Secondary | ICD-10-CM

## 2023-03-22 LAB — PSA: PSA: 4.4

## 2023-05-16 ENCOUNTER — Other Ambulatory Visit: Payer: Self-pay | Admitting: Interventional Cardiology

## 2023-05-16 DIAGNOSIS — E785 Hyperlipidemia, unspecified: Secondary | ICD-10-CM

## 2023-05-29 ENCOUNTER — Encounter: Payer: Self-pay | Admitting: Internal Medicine

## 2023-05-29 ENCOUNTER — Ambulatory Visit (INDEPENDENT_AMBULATORY_CARE_PROVIDER_SITE_OTHER): Payer: 59 | Admitting: Internal Medicine

## 2023-05-29 VITALS — BP 142/94 | HR 62 | Temp 98.1°F | Resp 16 | Ht 72.0 in | Wt 199.2 lb

## 2023-05-29 DIAGNOSIS — E785 Hyperlipidemia, unspecified: Secondary | ICD-10-CM | POA: Diagnosis not present

## 2023-05-29 DIAGNOSIS — I10 Essential (primary) hypertension: Secondary | ICD-10-CM | POA: Diagnosis not present

## 2023-05-29 DIAGNOSIS — Z0001 Encounter for general adult medical examination with abnormal findings: Secondary | ICD-10-CM | POA: Diagnosis not present

## 2023-05-29 DIAGNOSIS — Z1211 Encounter for screening for malignant neoplasm of colon: Secondary | ICD-10-CM | POA: Insufficient documentation

## 2023-05-29 DIAGNOSIS — Z23 Encounter for immunization: Secondary | ICD-10-CM

## 2023-05-29 DIAGNOSIS — R0609 Other forms of dyspnea: Secondary | ICD-10-CM | POA: Insufficient documentation

## 2023-05-29 DIAGNOSIS — Z114 Encounter for screening for human immunodeficiency virus [HIV]: Secondary | ICD-10-CM | POA: Insufficient documentation

## 2023-05-29 LAB — LIPID PANEL
Cholesterol: 189 mg/dL (ref 0–200)
HDL: 50 mg/dL (ref >39.00)
LDL Cholesterol: 116 mg/dL — ABNORMAL HIGH (ref 0–99)
NonHDL: 138.85
Total CHOL/HDL Ratio: 4
Triglycerides: 116 mg/dL (ref 0.0–149.0)
VLDL: 23.2 mg/dL (ref 0.0–40.0)

## 2023-05-29 LAB — CBC WITH DIFFERENTIAL/PLATELET
Basophils Absolute: 0 10*3/uL (ref 0.0–0.1)
Basophils Relative: 0.8 % (ref 0.0–3.0)
Eosinophils Absolute: 0.3 10*3/uL (ref 0.0–0.7)
Eosinophils Relative: 4.5 % (ref 0.0–5.0)
HCT: 46.4 % (ref 39.0–52.0)
Hemoglobin: 15.6 g/dL (ref 13.0–17.0)
Lymphocytes Relative: 23.5 % (ref 12.0–46.0)
Lymphs Abs: 1.4 10*3/uL (ref 0.7–4.0)
MCHC: 33.5 g/dL (ref 30.0–36.0)
MCV: 92.3 fL (ref 78.0–100.0)
Monocytes Absolute: 0.7 10*3/uL (ref 0.1–1.0)
Monocytes Relative: 12 % (ref 3.0–12.0)
Neutro Abs: 3.4 10*3/uL (ref 1.4–7.7)
Neutrophils Relative %: 59.2 % (ref 43.0–77.0)
Platelets: 191 10*3/uL (ref 150.0–400.0)
RBC: 5.02 Mil/uL (ref 4.22–5.81)
RDW: 12.7 % (ref 11.5–15.5)
WBC: 5.7 10*3/uL (ref 4.0–10.5)

## 2023-05-29 LAB — BASIC METABOLIC PANEL
BUN: 25 mg/dL — ABNORMAL HIGH (ref 6–23)
CO2: 28 meq/L (ref 19–32)
Calcium: 9.4 mg/dL (ref 8.4–10.5)
Chloride: 102 meq/L (ref 96–112)
Creatinine, Ser: 1.18 mg/dL (ref 0.40–1.50)
GFR: 65.88 mL/min (ref >60.00)
Glucose, Bld: 107 mg/dL — ABNORMAL HIGH (ref 70–99)
Potassium: 4.4 meq/L (ref 3.5–5.1)
Sodium: 137 meq/L (ref 135–145)

## 2023-05-29 LAB — HEPATIC FUNCTION PANEL
ALT: 41 U/L (ref 0–53)
AST: 24 U/L (ref 0–37)
Albumin: 4.7 g/dL (ref 3.5–5.2)
Alkaline Phosphatase: 92 U/L (ref 39–117)
Bilirubin, Direct: 0.1 mg/dL (ref 0.0–0.3)
Total Bilirubin: 0.7 mg/dL (ref 0.2–1.2)
Total Protein: 7 g/dL (ref 6.0–8.3)

## 2023-05-29 LAB — URINALYSIS, ROUTINE W REFLEX MICROSCOPIC
Bilirubin Urine: NEGATIVE
Hgb urine dipstick: NEGATIVE
Ketones, ur: NEGATIVE
Leukocytes,Ua: NEGATIVE
Nitrite: NEGATIVE
Specific Gravity, Urine: 1.025 (ref 1.000–1.030)
Total Protein, Urine: NEGATIVE
Urine Glucose: NEGATIVE
Urobilinogen, UA: 0.2 (ref 0.0–1.0)
pH: 6 (ref 5.0–8.0)

## 2023-05-29 LAB — TSH: TSH: 1.79 u[IU]/mL (ref 0.35–5.50)

## 2023-05-29 LAB — BRAIN NATRIURETIC PEPTIDE: Pro B Natriuretic peptide (BNP): 22 pg/mL (ref 0.0–100.0)

## 2023-05-29 LAB — TROPONIN I (HIGH SENSITIVITY): High Sens Troponin I: 11 ng/L (ref 2–17)

## 2023-05-29 MED ORDER — OLMESARTAN MEDOXOMIL 20 MG PO TABS: 20.0000 mg | ORAL_TABLET | Freq: Every day | ORAL | 1 refills | Status: DC

## 2023-05-29 NOTE — Progress Notes (Signed)
 Subjective:  Patient ID: Kalven Ganim, male    DOB: 07/17/1960  Age: 62 y.o. MRN: [id number]  CC: Annual Exam, Hypertension, and Hyperlipidemia   HPI Syed Zukas presents for a CPX and f/up ---  Discussed the use of AI scribe software for clinical note transcription with the patient, who gave verbal consent to proceed.  History of Present Illness   The patient has been maintaining an active lifestyle, walking approximately two to four miles daily. He reports occasional shortness of breath, which he attributes to recent weight gain, from 185-190 lbs to 199 lbs. He denies experiencing chest pain, diaphoresis, or palpitations.  His current medication regimen includes a cholesterol-lowering drug, fish oil for dry eye syndrome, a multivitamin, and over-the-counter allergy medication. He denies taking any blood thinners and reports no signs of blood clots.  The patient's last PSA test, conducted two months prior, showed slightly elevated levels at 4.4, but he denies any urinary symptoms. His last EKG was performed over a year ago. He has upcoming appointments with his cardiologist and urologist.       Outpatient Medications Prior to Visit  Medication Sig Dispense Refill   amoxicillin  (AMOXIL ) 500 MG capsule Take  prior to dentist appointment.     atorvastatin  (LIPITOR) 20 MG tablet TAKE 1 TABLET BY MOUTH EVERY DAY 90 tablet 2   fexofenadine (ALLEGRA) 180 MG tablet Take 180 mg by mouth daily as needed for allergies or rhinitis.     mometasone (ELOCON) 0.1 % cream Apply 1 Application topically as needed.     Multiple Vitamins-Minerals (ADULT GUMMY) CHEW Chew 2 capsules by mouth daily.     Omega-3 Fatty Acids (FISH OIL) 1000 MG CAPS      rivaroxaban  (XARELTO ) 20 MG TABS tablet Take 1 tablet (20 mg total) by mouth daily with supper. 30 tablet 0   No facility-administered medications prior to visit.    ROS Review of Systems  Constitutional:  Positive for unexpected weight change (wt  gain). Negative for appetite change, chills, diaphoresis and fatigue.  HENT: Negative.    Eyes: Negative.   Respiratory:  Positive for shortness of breath (DOE). Negative for cough, chest tightness and wheezing.   Cardiovascular:  Negative for chest pain, palpitations and leg swelling.  Gastrointestinal:  Negative for abdominal pain, constipation, diarrhea, nausea and vomiting.  Genitourinary: Negative.  Negative for decreased urine volume and difficulty urinating.  Musculoskeletal: Negative.  Negative for arthralgias and myalgias.  Skin: Negative.  Negative for color change and rash.  Neurological: Negative.  Negative for weakness.  Hematological:  Negative for adenopathy. Does not bruise/bleed easily.  Psychiatric/Behavioral: Negative.      Objective:  BP (!) 142/94 (BP Location: Left Arm, Patient Position: Sitting, Cuff Size: Normal)   Pulse 62   Temp 98.1 F (36.7 C) (Oral)   Resp 16   Ht 6' (1.829 m)   Wt 199 lb 3.2 oz (90.4 kg)   SpO2 94%   BMI 27.02 kg/m   BP Readings from Last 3 Encounters:  05/29/23 (!) 142/94  07/13/22 114/72  01/17/22 116/78    Wt Readings from Last 3 Encounters:  05/29/23 199 lb 3.2 oz (90.4 kg)  07/13/22 197 lb 12.8 oz (89.7 kg)  01/17/22 195 lb 9.6 oz (88.7 kg)    Physical Exam Vitals reviewed.  Constitutional:      Appearance: Normal appearance.  HENT:     Mouth/Throat:     Mouth: Mucous membranes are moist.  Eyes:  General: No scleral icterus.    Conjunctiva/sclera: Conjunctivae normal.  Cardiovascular:     Rate and Rhythm: Regular rhythm. Bradycardia present.     Heart sounds: Murmur heard.     Systolic murmur is present with a grade of 3/6.     No friction rub. No gallop.     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     Breath sounds: No stridor. No wheezing, rhonchi or rales.  Abdominal:     General: Abdomen is protuberant. Bowel  sounds are normal.     Palpations: There is no hepatomegaly, splenomegaly or mass.     Tenderness: There is no abdominal tenderness.     Hernia: No hernia is present.  Musculoskeletal:        General: No swelling or deformity.     Cervical back: Neck supple.     Right lower leg: No edema.     Left lower leg: No edema.  Lymphadenopathy:     Cervical: No cervical adenopathy.  Skin:    General: Skin is warm and dry.     Coloration: Skin is not pale.  Neurological:     General: No focal deficit present.     Mental Status: He is alert. Mental status is at baseline.     Lab Results  Component Value Date   WBC 5.7 05/29/2023   HGB 15.6 05/29/2023   HCT 46.4 05/29/2023   PLT 191.0 05/29/2023   GLUCOSE 107 (H) 05/29/2023   CHOL 189 05/29/2023   TRIG 116.0 05/29/2023   HDL 50.00 05/29/2023   LDLCALC 116 (H) 05/29/2023   ALT 41 05/29/2023   AST 24 05/29/2023   NA 137 05/29/2023   K 4.4 05/29/2023   CL 102 05/29/2023   CREATININE 1.18 05/29/2023   BUN 25 (H) 05/29/2023   CO2 28 05/29/2023   TSH 1.79 05/29/2023   PSA 4.4 03/22/2023   INR 1.0 12/10/2013   HGBA1C 5.3 12/28/2015    VAS US  LOWER EXTREMITY VENOUS REFLUX Result Date: 01/02/2022  Lower Venous DVT Study Patient Name:  LEANDER TOUT  Date of Exam:   01/02/2022 Medical Rec #: [id number]       Accession #:    [id number] Date of Birth: [birth year]      Patient Gender: M Patient Age:   22 years Exam Location:  [facility] Procedure:      VAS US  LOWER EXTREMITY VENOUS (DVT) Referring Phys: MAX HYATT --------------------------------------------------------------------------------  Indications: Swelling. Other Indications: Patient reported mid May having torn tendon repair surgery on                    his right leg. He reports constant swelling since surgery                    from below right knee to right ankle. Patient denies any                    chest pain. Patient reports mild shortness of breath since                    surgery  however, shortness of breath doesn't hinder him from                    doing normal activities, such as walking dogs multiple times  a day. Comparison Study: Previous lower venous 08/26/2021, reported Venous reflux is                   noted in the right greater saphenous vein in the distal                   calf. No evidence of DVT. Performing Technologist: Orvil Holmes RDCS  Examination Guidelines: A complete evaluation includes B-mode imaging, spectral Doppler, color Doppler, and power Doppler as needed of all accessible portions of each vessel. Bilateral testing is considered an integral part of a complete examination. Limited examinations for reoccurring indications may be performed as noted. The reflux portion of the exam is performed with the patient in reverse Trendelenburg.  +---------+---------------+---------+-----------+----------+--------------+ RIGHT    CompressibilityPhasicitySpontaneityPropertiesThrombus Aging +---------+---------------+---------+-----------+----------+--------------+ CFV      Full           Yes      Yes                                 +---------+---------------+---------+-----------+----------+--------------+ SFJ      Full           Yes      Yes                                 +---------+---------------+---------+-----------+----------+--------------+ FV Prox  Full           Yes      Yes                                 +---------+---------------+---------+-----------+----------+--------------+ FV Mid   Full           Yes      Yes                                 +---------+---------------+---------+-----------+----------+--------------+ FV DistalFull           Yes      Yes                                 +---------+---------------+---------+-----------+----------+--------------+ PFV      Full                                                         +---------+---------------+---------+-----------+----------+--------------+ POP      Full           Yes      Yes                                 +---------+---------------+---------+-----------+----------+--------------+ PTV      Full           Yes      Yes                                 +---------+---------------+---------+-----------+----------+--------------+ PERO     None  No       No                                  +---------+---------------+---------+-----------+----------+--------------+ Gastroc  Full                                                        +---------+---------------+---------+-----------+----------+--------------+ GSV      Full           Yes      Yes                                 +---------+---------------+---------+-----------+----------+--------------+   +----+---------------+---------+-----------+----------+--------------+ LEFTCompressibilityPhasicitySpontaneityPropertiesThrombus Aging +----+---------------+---------+-----------+----------+--------------+ CFV Full           Yes      Yes                                 +----+---------------+---------+-----------+----------+--------------+    Findings reported to Dr. Judith Larsson, PM[facility] at [facility]. at 900AM.  Summary: RIGHT: - Findings consistent with age indeterminate deep vein thrombosis involving the right peroneal veins. - No cystic structure found in the popliteal fossa. - Age indeterminate mid peronal DVT.  LEFT: - No evidence of common femoral vein obstruction.  *See table(s) above for measurements and observations. Electronically signed by Dorn Lesches MD on 01/02/2022 at 5:55:29 PM.    Final    DG Chest 2 View Result Date: 01/02/2022 CLINICAL DATA:  Shortness of breath and acute DVT. EXAM: CHEST - 2 VIEW COMPARISON:  April 13 2021 FINDINGS: The heart size and mediastinal contours are stable. 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  Mild patchy opacities identified in the medial right lung base. There is no pulmonary edema or pleural effusion. The visualized skeletal structures are unremarkable. IMPRESSION: Mild patchy opacities identified in the medial right lung base, developing pneumonia is not excluded. Electronically Signed   By: Craig Farr M.D.   On: 01/02/2022 15:16   CT Angio Chest Pulmonary Embolism (PE) W or WO Contrast Result Date: 01/02/2022 CLINICAL DATA:  Shortness of breath. Recent ankle surgery. Assess for pulmonary embolus. EXAM: CT ANGIOGRAPHY CHEST WITH CONTRAST TECHNIQUE: Multidetector CT imaging of the chest was performed using the standard protocol during bolus administration of intravenous contrast. Multiplanar CT image reconstructions and MIPs were obtained to evaluate the vascular anatomy. RADIATION DOSE REDUCTION: This exam was performed according to the departmental dose-optimization program which includes automated exposure control, adjustment of the mA and/or kV according to patient size and/or use of iterative reconstruction technique. CONTRAST:  75mL OMNIPAQUE  IOHEXOL  350 MG/ML SOLN COMPARISON:  December 25, 2013 FINDINGS: Cardiovascular: Satisfactory opacification of the pulmonary arteries to the segmental level. No evidence of pulmonary embolism. Normal heart size. Aortic valvular replacement ring identified. Mitral valvular replacement ring noted. Ascending aorta measures 3.4 cm in diameter unchanged compared prior exam. No pericardial effusion. Mediastinum/Nodes: No enlarged mediastinal, hilar, or axillary lymph nodes. Thyroid  gland, trachea, and esophagus demonstrate no significant findings. Lungs/Pleura: Lungs are clear. No pleural effusion or pneumothorax. Upper Abdomen: A 2.1 cm simple cyst is identified in the upper pole left kidney. No follow-up is  necessary. The other visualized upper abdominal structures are unremarkable. Mild degenerative joint changes of the spine are noted. Musculoskeletal: No  acute abnormality. Mild degenerative joint changes are noted. Review of the MIP images confirms the above findings. IMPRESSION: 1. No pulmonary embolus. 2. No acute abnormality identified in the chest. Electronically Signed   By: Craig Farr M.D.   On: 01/02/2022 11:07    Assessment & Plan:   Hyperlipidemia with target LDL less than 130- LDL goal achieved. Doing well on the statin  -     Lipid panel; Future -     TSH; Future -     Hepatic function panel; Future  Essential hypertension- EKG is negative for LVH. Will start an ARB -     EKG 12-Lead -     CBC with Differential/Platelet; Future -     Basic metabolic panel; Future -     TSH; Future -     Urinalysis, Routine w reflex microscopic; Future -     Aldosterone + renin activity w/ ratio; Future -     Olmesartan  Medoxomil; Take 1 tablet (20 mg total) by mouth daily.  Dispense: 90 tablet; Refill: 1  Encounter for screening for HIV -     HIV Antibody (routine testing w rflx); Future  DOE (dyspnea on exertion)- Will risk stratify with a CCS. -     EKG 12-Lead -     CBC with Differential/Platelet; Future -     Brain natriuretic peptide; Future -     Troponin I (High Sensitivity); Future -     D-dimer, quantitative; Future -     CT CARDIAC SCORING (SELF PAY ONLY); Future  Encounter for general adult medical examination with abnormal findings- Exam completed, labs reviewed, vaccines reviewed and updated, cancer screenings addressed, pt ed material was given.  -     HIV Antibody (routine testing w rflx); Future  Screening for colon cancer -     Ambulatory referral to Gastroenterology     Follow-up: Return in about 6 months (around 11/26/2023).  Debby Molt, MD

## 2023-05-29 NOTE — Patient Instructions (Signed)
 Health Maintenance, Male  Adopting a healthy lifestyle and getting preventive care are important in promoting health and wellness. Ask your health care provider about:  The right schedule for you to have regular tests and exams.  Things you can do on your own to prevent diseases and keep yourself healthy.  What should I know about diet, weight, and exercise?  Eat a healthy diet    Eat a diet that includes plenty of vegetables, fruits, low-fat dairy products, and lean protein.  Do not eat a lot of foods that are high in solid fats, added sugars, or sodium.  Maintain a healthy weight  Body mass index (BMI) is a measurement that can be used to identify possible weight problems. It estimates body fat based on height and weight. Your health care provider can help determine your BMI and help you achieve or maintain a healthy weight.  Get regular exercise  Get regular exercise. This is one of the most important things you can do for your health. Most adults should:  Exercise for at least 150 minutes each week. The exercise should increase your heart rate and make you sweat (moderate-intensity exercise).  Do strengthening exercises at least twice a week. This is in addition to the moderate-intensity exercise.  Spend less time sitting. Even light physical activity can be beneficial.  Watch cholesterol and blood lipids  Have your blood tested for lipids and cholesterol at 63 years of age, then have this test every 5 years.  You may need to have your cholesterol levels checked more often if:  Your lipid or cholesterol levels are high.  You are older than 63 years of age.  You are at high risk for heart disease.  What should I know about cancer screening?  Many types of cancers can be detected early and may often be prevented. Depending on your health history and family history, you may need to have cancer screening at various ages. This may include screening for:  Colorectal cancer.  Prostate cancer.  Skin cancer.  Lung  cancer.  What should I know about heart disease, diabetes, and high blood pressure?  Blood pressure and heart disease  High blood pressure causes heart disease and increases the risk of stroke. This is more likely to develop in people who have high blood pressure readings or are overweight.  Talk with your health care provider about your target blood pressure readings.  Have your blood pressure checked:  Every 3-5 years if you are 24-52 years of age.  Every year if you are 3 years old or older.  If you are between the ages of 60 and 72 and are a current or former smoker, ask your health care provider if you should have a one-time screening for abdominal aortic aneurysm (AAA).  Diabetes  Have regular diabetes screenings. This checks your fasting blood sugar level. Have the screening done:  Once every three years after age 66 if you are at a normal weight and have a low risk for diabetes.  More often and at a younger age if you are overweight or have a high risk for diabetes.  What should I know about preventing infection?  Hepatitis B  If you have a higher risk for hepatitis B, you should be screened for this virus. Talk with your health care provider to find out if you are at risk for hepatitis B infection.  Hepatitis C  Blood testing is recommended for:  Everyone born from [birth year] through 1966.  Anyone  with known risk factors for hepatitis C.  Sexually transmitted infections (STIs)  You should be screened each year for STIs, including gonorrhea and chlamydia, if:  You are sexually active and are younger than 63 years of age.  You are older than 63 years of age and your health care provider tells you that you are at risk for this type of infection.  Your sexual activity has changed since you were last screened, and you are at increased risk for chlamydia or gonorrhea. Ask your health care provider if you are at risk.  Ask your health care provider about whether you are at high risk for HIV. Your health care provider  may recommend a prescription medicine to help prevent HIV infection. If you choose to take medicine to prevent HIV, you should first get tested for HIV. You should then be tested every 3 months for as long as you are taking the medicine.  Follow these instructions at home:  Alcohol use  Do not drink alcohol if your health care provider tells you not to drink.  If you drink alcohol:  Limit how much you have to 0-2 drinks a day.  Know how much alcohol is in your drink. In the [place] one drink equals one 12 oz bottle of beer (355 mL), one 5 oz glass of wine (148 mL), or one 1 oz glass of hard liquor (44 mL).  Lifestyle  Do not use any products that contain nicotine or tobacco. These products include cigarettes, chewing tobacco, and vaping devices, such as e-cigarettes. If you need help quitting, ask your health care provider.  Do not use street drugs.  Do not share needles.  Ask your health care provider for help if you need support or information about quitting drugs.  General instructions  Schedule regular health, dental, and eye exams.  Stay current with your vaccines.  Tell your health care provider if:  You often feel depressed.  You have ever been abused or do not feel safe at home.  Summary  Adopting a healthy lifestyle and getting preventive care are important in promoting health and wellness.  Follow your health care provider's instructions about healthy diet, exercising, and getting tested or screened for diseases.  Follow your health care provider's instructions on monitoring your cholesterol and blood pressure.  This information is not intended to replace advice given to you by your health care provider. Make sure you discuss any questions you have with your health care provider.  Document Revised: 10/04/2020 Document Reviewed: 10/04/2020  Elsevier Patient Education  2024 [employer].

## 2023-05-30 DIAGNOSIS — Z23 Encounter for immunization: Secondary | ICD-10-CM | POA: Insufficient documentation

## 2023-05-30 MED ORDER — SHINGRIX 50 MCG/0.5ML IM SUSR: 0.5000 mL | Freq: Once | INTRAMUSCULAR | 1 refills | Status: AC

## 2023-06-02 ENCOUNTER — Other Ambulatory Visit: Payer: Self-pay | Admitting: Internal Medicine

## 2023-06-02 DIAGNOSIS — I10 Essential (primary) hypertension: Secondary | ICD-10-CM

## 2023-06-02 LAB — HIV ANTIBODY (ROUTINE TESTING W REFLEX): HIV 1&2 Ab, 4th Generation: NONREACTIVE

## 2023-06-02 LAB — ALDOSTERONE + RENIN ACTIVITY W/ RATIO
ALDO / PRA Ratio: 10.5 {ratio} (ref 0.9–28.9)
Aldosterone: 9 ng/dL
Renin Activity: 0.86 ng/mL/h (ref 0.25–5.82)

## 2023-06-02 LAB — D-DIMER, QUANTITATIVE: D-Dimer, Quant: 0.26 ug{FEU}/mL (ref <0.50)

## 2023-06-18 ENCOUNTER — Encounter: Payer: 59 | Admitting: Internal Medicine

## 2023-06-21 ENCOUNTER — Encounter: Payer: Self-pay | Admitting: Internal Medicine

## 2023-06-21 ENCOUNTER — Ambulatory Visit
Admission: RE | Admit: 2023-06-21 | Discharge: 2023-06-21 | Disposition: A | Discharge status: Home / Self Care | Payer: 59 | Source: Ambulatory Visit | Attending: Internal Medicine | Admitting: Internal Medicine

## 2023-06-21 DIAGNOSIS — R0609 Other forms of dyspnea: Secondary | ICD-10-CM

## 2023-06-28 ENCOUNTER — Ambulatory Visit (AMBULATORY_SURGERY_CENTER): Payer: 59

## 2023-06-28 VITALS — Ht 72.0 in | Wt 200.0 lb

## 2023-06-28 DIAGNOSIS — Z1211 Encounter for screening for malignant neoplasm of colon: Secondary | ICD-10-CM

## 2023-06-28 LAB — PSA: PSA: 3.7

## 2023-06-28 MED ORDER — SUFLAVE 178.7 G PO SOLR: 1.0000 | ORAL | 0 refills | Status: DC

## 2023-06-28 NOTE — Progress Notes (Signed)
No egg or soy allergy known to patient  No issues known to pt with past sedation with any surgeries or procedures Patient denies ever being told they had issues or difficulty with intubation  No FH of Malignant Hyperthermia Pt is not on diet pills Pt is not on  home 02  Pt is not on blood thinners  Pt denies issues with constipation  No A fib or A flutter Have any cardiac testing pending--No, only annual appointment.  Patient instructed if any cardiac testing is done or recommended, to inform [facility], he verbalized understanding. Pt can ambulate  Pt denies use of chewing tobacco Discussed diabetic I weight loss medication holds Discussed NSAID holds Checked BMI Pt instructed to use Singlecare.com or GoodRx for a price reduction on prep  Patient's chart reviewed by Cathlyn Parsons CNRA prior to previsit and patient appropriate for the [facility].  Pre visit completed and red dot placed by patient's name on their procedure day (on provider's schedule).

## 2023-07-06 NOTE — Progress Notes (Signed)
 Cardiology Office Note:   Date:  07/09/2023  ID:  Cody Barnes, DOB 13-Feb-1961, MRN [id number] PCP:  [name], MD  [facility] Providers Cardiologist:  [name], MD Referring MD: [name], MD  Chief Complaint/Reason for Referral: Follow-up for valvular heart disease ASSESSMENT:    1. Shortness of breath   2. H/O mitral valve repair   3. Diastolic dysfunction   4. Coronary artery calcification seen on CAT scan   5. Hyperlipidemia LDL goal <70   6. Primary hypertension   7. CKD (chronic kidney disease) stage 2, GFR 60-89 ml/min   8. 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  Will obtain an echocardiogram to evaluate further.  If this is the case I will have him return to speak about further management.   Status post AVR: Mean gradient across his aortic valve on last echo was 18 mmHg.  Obtain repeat echocardiogram as this is approaching the definition of bioprosthetic aortic valve degeneration consisting of a mean gradient of 20 mmHg. History of mitral valve repair: Last echocardiogram demonstrated a durable repair. Diastolic dysfunction: Continue olmesartan  20 mg; consider Jardiance in the future. Coronary artery disease: Mild coronary artery calcification seen on calcium  score CT.  Start aspirin  81 mg and continue atorvastatin . Hyperlipidemia: Recent LDL was not at goal with a target LDL of less than 70.  Increase atorvastatin  to 40 mg and check lipid panel, LFTs, and LP(a) in 2 months. Hypertension: Blood pressure is well-controlled; continue olmesartan  20 mg. CKD stage II: Continue olmesartan  20 mg for renal protection.  Consider Jardiance in the future. Elevated BMI: Check hemoglobin A1c to screen for diabetes.  Otherwise continue diet and exercise modification. Snoring:  Evalaute for sleep apnea.            Dispo:   Return in about 6 months (around 01/06/2024).      Medication Adjustments/Labs and Tests Ordered: Current medicines are reviewed at length with the patient today.  Concerns regarding medicines are outlined above.  The following changes have been made:     Labs/tests ordered: Orders Placed This Encounter  Procedures   Lipid panel   Hepatic function panel   Lipoprotein A (LPA)   HgB A1c   ECHOCARDIOGRAM COMPLETE   Split night study    Medication Changes: Meds ordered this encounter  Medications   aspirin  EC 81 MG tablet    Sig: Take 1 tablet (81 mg total) by mouth daily. Swallow whole.   atorvastatin  (LIPITOR) 40 MG tablet    Sig: Take 1 tablet (40 mg total) by mouth daily.    Dispense:  90 tablet    Refill:  3    Dose increase    Current medicines are reviewed at length with the patient today.  The patient does not have concerns regarding medicines.  I spent 33 minutes reviewing all clinical data during and prior to this visit including all relevant imaging studies, laboratories, clinical information from other health systems and prior notes from both Cardiology and other specialties, interviewing the patient, conducting a complete physical examination, and coordinating care in order to formulate a comprehensive and personalized evaluation and treatment plan.   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  He was seen a year ago.  An echocardiogram at that time demonstrated a mean gradient of 18 mmHg across his aortic valve  replacement.  He had reported some shortness of breath.  A BNP was drawn and found not to be elevated.  The patient tells me that over the last 2 years she has gained about 20 pounds.  He has noticed increasing shortness of breath when he has to walk up 2 flights of stairs for his job.  He denies any shortness of breath at rest.  He is also noticed some daytime somnolence and he does snore.  He denies any exertional angina, presyncope, syncope, palpitations, paroxysmal nocturnal dyspnea, orthopnea.  He has some mild dependent edema more so in his right lower extremity which improves with being supine.  He fortunately does not required any emergency room visits or hospitalizations.  He is tolerating his atorvastatin  without myalgias or arthralgias.  He has had no severe bleeding or bruising.          Current Medications: Current Meds  Medication Sig   aspirin  EC 81 MG tablet Take 1 tablet (81 mg total) by mouth daily. Swallow whole.   atorvastatin  (LIPITOR) 40 MG tablet Take 1 tablet (40 mg total) by mouth daily.     Review of Systems:   Please see the history of present illness.    All other systems reviewed and are negative.     EKGs/Labs/Other Test Reviewed:   EKG: EKG performed December 2024 that I personally reviewed demonstrates sinus bradycardia with an IVCD  EKG Interpretation Date/Time:    Ventricular Rate:    PR Interval:    QRS Duration:    QT Interval:    QTC Calculation:   R Axis:      Text Interpretation:           Risk Assessment/Calculations:     STOP-Bang Score:  6       Physical Exam:   VS:  BP 118/80   Pulse (!) 54   Ht 6' (1.829 m)   Wt 198 lb (89.8 kg)   SpO2 98%   BMI 26.85 kg/m        Wt Readings from Last 3 Encounters:  07/09/23 198 lb (89.8 kg)  06/28/23 200 lb (90.7 kg)  05/29/23 199 lb 3.2 oz (90.4 kg)      GENERAL:  No apparent distress, AOx3 HEENT:  No carotid bruits, +2 carotid impulses, no scleral icterus CAR: RRR 3 out of 6  crescendo decrescendo murmur, gallops, rubs, or thrills RES:  Clear to auscultation bilaterally ABD:  Soft, nontender, nondistended, positive bowel sounds x 4 VASC:  +2 radial pulses, +2 carotid pulses NEURO:  CN 2-12 grossly intact; motor and sensory grossly intact PSYCH:  No active depression or anxiety EXT:  No edema, ecchymosis, or cyanosis  Signed, Mee Macdonnell K Arnitra Sokoloski, MD  07/09/2023 8:51 AM    [facility] [address] [place], [place], [place]  [address] Phone: [phone]; Fax: [phone]   Note:  This document was prepared using Dragon voice recognition software and may include unintentional dictation errors.

## 2023-07-09 ENCOUNTER — Encounter: Payer: Self-pay | Admitting: Internal Medicine

## 2023-07-09 ENCOUNTER — Ambulatory Visit: Payer: 59 | Attending: Internal Medicine | Admitting: Internal Medicine

## 2023-07-09 VITALS — BP 118/80 | HR 54 | Ht 72.0 in | Wt 198.0 lb

## 2023-07-09 DIAGNOSIS — Z952 Presence of prosthetic heart valve: Secondary | ICD-10-CM

## 2023-07-09 DIAGNOSIS — I2510 Atherosclerotic heart disease of native coronary artery without angina pectoris: Secondary | ICD-10-CM

## 2023-07-09 DIAGNOSIS — R0602 Shortness of breath: Secondary | ICD-10-CM

## 2023-07-09 DIAGNOSIS — Z6826 Body mass index (BMI) 26.0-26.9, adult: Secondary | ICD-10-CM

## 2023-07-09 DIAGNOSIS — I5189 Other ill-defined heart diseases: Secondary | ICD-10-CM

## 2023-07-09 DIAGNOSIS — Z9889 Other specified postprocedural states: Secondary | ICD-10-CM | POA: Diagnosis not present

## 2023-07-09 DIAGNOSIS — I10 Essential (primary) hypertension: Secondary | ICD-10-CM

## 2023-07-09 DIAGNOSIS — N182 Chronic kidney disease, stage 2 (mild): Secondary | ICD-10-CM

## 2023-07-09 DIAGNOSIS — E785 Hyperlipidemia, unspecified: Secondary | ICD-10-CM

## 2023-07-09 MED ORDER — ASPIRIN 81 MG PO TBEC: 81.0000 mg | DELAYED_RELEASE_TABLET | Freq: Every day | ORAL | Status: AC

## 2023-07-09 MED ORDER — ATORVASTATIN CALCIUM 40 MG PO TABS
40.0000 mg | ORAL_TABLET | Freq: Every day | ORAL | 3 refills | Status: DC
  Filled 2024-01-30: qty 90, 90d supply, fill #0

## 2023-07-09 NOTE — Addendum Note (Signed)
 Addended by: Saron Tweed on: 07/09/2023 09:22 AM   Modules accepted: Orders

## 2023-07-09 NOTE — Patient Instructions (Signed)
 Medication Instructions:  Your physician has recommended you make the following change in your medication:  1.) increase atorvastatin  (Lipitor) to 40 mg - one tablet daily 2.) start aspirin  81 mg - one tablet daily  *If you need a refill on your cardiac medications before your next appointment, please call your pharmacy*   Lab Work: Today: hemoglobin A1c (hgA1C) Go to [facility] in about 8 weeks for bloodwork (lipids, liver, Lpa)  If you have labs (blood work) drawn today and your tests are completely normal, you will receive your results only by: MyChart Message (if you have MyChart) OR A paper copy in the mail If you have any lab test that is abnormal or we need to change your treatment, we will call you to review the results.   Testing/Procedures: Your physician has requested that you have an echocardiogram. Echocardiography is a painless test that uses sound waves to create images of your heart. It provides your doctor with information about the size and shape of your heart and how well your heart's chambers and valves are working. This procedure takes approximately one hour. There are no restrictions for this procedure. Please do NOT wear cologne, perfume, aftershave, or lotions (deodorant is allowed). Please arrive 15 minutes prior to your appointment time.  Please note: We ask at that you not bring children with you during ultrasound (echo/ vascular) testing. Due to room size and safety concerns, children are not allowed in the ultrasound rooms during exams. Our front office staff cannot provide observation of children in our lobby area while testing is being conducted. An adult accompanying a patient to their appointment will only be allowed in the ultrasound room at the discretion of the ultrasound technician under special circumstances. We apologize for any inconvenience.  Your physician has recommended that you have a sleep study. This test records several body functions during  sleep, including: brain activity, eye movement, oxygen and carbon dioxide blood levels, heart rate and rhythm, breathing rate and rhythm, the flow of air through your mouth and nose, snoring, body muscle movements, and chest and belly movement.    Follow-Up: At [facility], you and your health needs are our priority.  As part of our continuing mission to provide you with exceptional heart care, we have created designated Provider Care Teams.  These Care Teams include your primary Cardiologist (physician) and Advanced Practice Providers (APPs -  Physician Assistants and Nurse Practitioners) who all work together to provide you with the care you need, when you need it.   Your next appointment:   6 month(s)  Provider:   Arun Thukkani, MD

## 2023-07-10 ENCOUNTER — Encounter: Payer: Self-pay | Admitting: Internal Medicine

## 2023-07-10 ENCOUNTER — Ambulatory Visit (HOSPITAL_COMMUNITY): Payer: 59 | Attending: Internal Medicine

## 2023-07-10 DIAGNOSIS — Z9889 Other specified postprocedural states: Secondary | ICD-10-CM | POA: Insufficient documentation

## 2023-07-10 DIAGNOSIS — R0602 Shortness of breath: Secondary | ICD-10-CM | POA: Diagnosis present

## 2023-07-10 LAB — ECHOCARDIOGRAM COMPLETE
AR max vel: 1.57 cm2
AV Area VTI: 1.71 cm2
AV Area mean vel: 1.73 cm2
AV Mean grad: 18 mm[Hg]
AV Peak grad: 36.2 mm[Hg]
Ao pk vel: 3.01 m/s
Area-P 1/2: 1.43 cm2
MV VTI: 1.77 cm2
S' Lateral: 3.1 cm

## 2023-07-10 LAB — HEMOGLOBIN A1C
Est. average glucose Bld gHb Est-mCnc: 108 mg/dL
Hgb A1c MFr Bld: 5.4 % (ref 4.8–5.6)

## 2023-07-23 ENCOUNTER — Encounter: Payer: Self-pay | Admitting: Internal Medicine

## 2023-07-24 ENCOUNTER — Other Ambulatory Visit: Payer: Self-pay | Admitting: *Deleted

## 2023-07-24 DIAGNOSIS — E785 Hyperlipidemia, unspecified: Secondary | ICD-10-CM

## 2023-07-25 ENCOUNTER — Ambulatory Visit (AMBULATORY_SURGERY_CENTER): Payer: 59 | Admitting: Internal Medicine

## 2023-07-25 ENCOUNTER — Encounter: Payer: Self-pay | Admitting: Internal Medicine

## 2023-07-25 VITALS — BP 109/64 | HR 47 | Temp 98.1°F | Resp 17 | Ht 72.0 in | Wt 200.0 lb

## 2023-07-25 DIAGNOSIS — Z1211 Encounter for screening for malignant neoplasm of colon: Secondary | ICD-10-CM

## 2023-07-25 DIAGNOSIS — K5730 Diverticulosis of large intestine without perforation or abscess without bleeding: Secondary | ICD-10-CM | POA: Diagnosis not present

## 2023-07-25 MED ORDER — SODIUM CHLORIDE 0.9 % IV SOLN: 500.0000 mL | Freq: Once | INTRAVENOUS | Status: DC

## 2023-07-25 NOTE — Patient Instructions (Signed)
 Resume previous diet and medications.  Repeat colonoscopy in 10 years per Dr. Marina Goodell recommendations.  Handout provided on diverticulosis.    YOU HAD AN ENDOSCOPIC PROCEDURE TODAY AT THE [facility]:   Refer to the procedure report that was given to you for any specific questions about what was found during the examination.  If the procedure report does not answer your questions, please call your gastroenterologist to clarify.  If you requested that your care partner not be given the details of your procedure findings, then the procedure report has been included in a sealed envelope for you to review at your convenience later.  YOU SHOULD EXPECT: Some feelings of bloating in the abdomen. Passage of more gas than usual.  Walking can help get rid of the air that was put into your GI tract during the procedure and reduce the bloating. If you had a lower endoscopy (such as a colonoscopy or flexible sigmoidoscopy) you may notice spotting of blood in your stool or on the toilet paper. If you underwent a bowel prep for your procedure, you may not have a normal bowel movement for a few days.  Please Note:  You might notice some irritation and congestion in your nose or some drainage.  This is from the oxygen used during your procedure.  There is no need for concern and it should clear up in a day or so.  SYMPTOMS TO REPORT IMMEDIATELY:  Following lower endoscopy (colonoscopy or flexible sigmoidoscopy):  Excessive amounts of blood in the stool  Significant tenderness or worsening of abdominal pains  Swelling of the abdomen that is new, acute  Fever of 100F or higher  For urgent or emergent issues, a gastroenterologist can be reached at any hour by calling [phone]. Do not use MyChart messaging for urgent concerns.    DIET:  We do recommend a small meal at first, but then you may proceed to your regular diet.  Drink plenty of fluids but you should avoid alcoholic beverages for 24  hours.  ACTIVITY:  You should plan to take it easy for the rest of today and you should NOT DRIVE or use heavy machinery until tomorrow (because of the sedation medicines used during the test).    FOLLOW UP: Our staff will call the number listed on your records the next business day following your procedure.  We will call around 7:15- 8:00 am to check on you and address any questions or concerns that you may have regarding the information given to you following your procedure. If we do not reach you, we will leave a message.     If any biopsies were taken you will be contacted by phone or by letter within the next 1-3 weeks.  Please call us at [phone] if you have not heard about the biopsies in 3 weeks.    SIGNATURES/CONFIDENTIALITY: You and/or your care partner have signed paperwork which will be entered into your electronic medical record.  These signatures attest to the fact that that the information above on your After Visit Summary has been reviewed and is understood.  Full responsibility of the confidentiality of this discharge information lies with you and/or your care-partner.

## 2023-07-25 NOTE — Progress Notes (Signed)
 HISTORY OF PRESENT ILLNESS:  Cody Barnes is a 63 y.o. male who presents today for colon cancer screening.  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    Past Surgical History:  Procedure Laterality Date   AORTIC VALVE REPLACEMENT     HIP FRACTURE SURGERY     left   INGUINAL HERNIA REPAIR     as infant   LEFT AND RIGHT HEART CATHETERIZATION WITH CORONARY ANGIOGRAM N/A 12/12/2013   Procedure: LEFT AND RIGHT HEART CATHETERIZATION WITH CORONARY ANGIOGRAM;  Surgeon: Corky Crafts, MD;  Location: [facility] CATH LAB;  Service: Cardiovascular;  Laterality: N/A;   MITRAL VALVE REPLACEMENT     OPEN REDUCTION INTERNAL FIXATION (ORIF) METACARPAL Left 01/25/2021   Procedure: Left index finger open reduction internal fixation metacarpal fracture and manipulation under anesthesia middle finger base fracture;  Surgeon: Dominica Severin, MD;  Location: [facility] OR;  Service: Orthopedics;  Laterality: Left;  1hr Block with IV Sedation   TEE WITHOUT CARDIOVERSION N/A 11/25/2013   Procedure: TRANSESOPHAGEAL ECHOCARDIOGRAM (TEE);  Surgeon: Quintella Reichert, MD;  Location: [facility] ENDOSCOPY;  Service: Cardiovascular;  Laterality: N/A;    Social History Coleby Yett  reports that he has never smoked. He has never been exposed to tobacco smoke. He has never used smokeless tobacco. He reports current alcohol use of about 12.0 standard drinks of alcohol per week. He reports that he does not use drugs.  family history includes Breast  cancer (age of onset: 41) in his mother; Liver cancer (age of onset: 60) in his mother.  No Known Allergies     PHYSICAL EXAMINATION: Vital signs: BP (!) 138/92   Pulse (!) 48   Temp 98.1 F (36.7 C) (Skin)   Resp 10   Ht 6' (1.829 m)   Wt 200 lb (90.7 kg)   SpO2 100%   BMI 27.12 kg/m  General: Well-developed, well-nourished, no acute distress HEENT: Sclerae are anicteric, conjunctiva pink. Oral mucosa intact Lungs: Clear Heart: Regular Abdomen: soft, nontender, nondistended, no obvious ascites, no peritoneal signs, normal bowel sounds. No organomegaly. Extremities: No edema Psychiatric: alert and oriented x3. Cooperative     ASSESSMENT:  Colon cancer screening   PLAN:  Screening colonoscopy

## 2023-07-25 NOTE — Progress Notes (Signed)
 Vss nad trans to pacu

## 2023-07-25 NOTE — Progress Notes (Signed)
 Pt's states no medical or surgical changes since previsit or office visit.

## 2023-07-25 NOTE — Op Note (Signed)
 [facility] Patient Name: Cody Barnes Procedure Date: 07/25/2023 11:19 AM MRN: [id number] Endoscopist: Wilhemina Bonito. Marina Goodell , MD, [id number] Age: 63 Referring MD:  Date of Birth: April 15, 1961 Gender: Male Account #: [id number] Procedure:                Colonoscopy Indications:              Screening for colorectal malignant neoplasm.                            Previous examination 2012 with Dr. Christella Hartigan was normal Medicines:                Monitored Anesthesia Care Procedure:                Pre-Anesthesia Assessment:                           - Prior to the procedure, a History and Physical                            was performed, and patient medications and                            allergies were reviewed. The patient's tolerance of                            previous anesthesia was also reviewed. The risks                            and benefits of the procedure and the sedation                            options and risks were discussed with the patient.                            All questions were answered, and informed consent                            was obtained. Prior Anticoagulants: The patient has                            taken no anticoagulant or antiplatelet agents.                            After reviewing the risks and benefits, the patient                            was deemed in satisfactory condition to undergo the                            procedure.                           After obtaining informed consent, the colonoscope  was passed under direct vision. Throughout the                            procedure, the patient's blood pressure, pulse, and                            oxygen saturations were monitored continuously. The                            Colonoscope was introduced through the anus and                            advanced to the the cecum, identified by                            appendiceal orifice and ileocecal  valve. The                            ileocecal valve, appendiceal orifice, and rectum                            were photographed. The quality of the bowel                            preparation was excellent. The colonoscopy was                            performed without difficulty. The patient tolerated                            the procedure well. The bowel preparation used was                            SUPREP via single dose instruction. Scope In: 11:31:56 AM Scope Out: 11:44:17 AM Scope Withdrawal Time: 0 hours 10 minutes 24 seconds  Total Procedure Duration: 0 hours 12 minutes 21 seconds  Findings:                 Many diverticula were found in the sigmoid colon                            and right colon.                           The exam was otherwise without abnormality on                            direct and retroflexion views. Complications:            No immediate complications. Estimated blood loss:                            None. Estimated Blood Loss:     Estimated blood loss: none. Impression:               - Diverticulosis in the sigmoid  colon and in the                            right colon.                           - The examination was otherwise normal on direct                            and retroflexion views.                           - No specimens collected. Recommendation:           - Repeat colonoscopy in 10 years for screening                            purposes.                           - Patient has a contact number available for                            emergencies. The signs and symptoms of potential                            delayed complications were discussed with the                            patient. Return to normal activities tomorrow.                            Written discharge instructions were provided to the                            patient.                           - Resume previous diet.                           -  Continue present medications. Wilhemina Bonito. Marina Goodell, MD 07/25/2023 11:49:08 AM This report has been signed electronically.

## 2023-07-26 ENCOUNTER — Telehealth: Payer: Self-pay

## 2023-07-26 NOTE — Telephone Encounter (Signed)
 Post procedure follow up call, no answer

## 2023-09-25 ENCOUNTER — Telehealth: Payer: Self-pay

## 2023-09-25 NOTE — Telephone Encounter (Signed)
**Note De-Identified Dane Kopke Obfuscation** Per the [employer] website: [facility] does not require a prior authorization for CPT code 04540 (Split Night Sleep Study).  I have transferred the order to the sleep lab so they can contact the pt to schedule the test.

## 2023-11-15 ENCOUNTER — Telehealth: Payer: Self-pay | Admitting: Internal Medicine

## 2023-11-15 DIAGNOSIS — R0683 Snoring: Secondary | ICD-10-CM

## 2023-11-15 NOTE — Telephone Encounter (Signed)
 Called and spoke w patient's wife.  Adv sleep study order placed for Cody Barnes.  Will be contacted after precert to schedule.  She is is so appreciative for assistance.

## 2023-11-15 NOTE — Telephone Encounter (Signed)
 What problem are you experiencing? Spouse is calling because pt has deductible and it is more expensive per his insurance because it will done in a hospital. She wants to know can it be done somewhere else. Also wants to know why test was ordered.  Who is your medical equipment company?   3)    If patient is calling about their sleep study results please route to [facility] Sleep Study Pool.   Please route to the sleep study coordinator.

## 2023-11-26 ENCOUNTER — Ambulatory Visit: Payer: 59 | Admitting: Internal Medicine

## 2023-11-27 ENCOUNTER — Ambulatory Visit: Admitting: Internal Medicine

## 2023-11-28 ENCOUNTER — Encounter (HOSPITAL_BASED_OUTPATIENT_CLINIC_OR_DEPARTMENT_OTHER): Admitting: Cardiology

## 2023-12-03 ENCOUNTER — Encounter: Payer: Self-pay | Admitting: Internal Medicine

## 2023-12-03 ENCOUNTER — Ambulatory Visit (INDEPENDENT_AMBULATORY_CARE_PROVIDER_SITE_OTHER): Admitting: Internal Medicine

## 2023-12-03 ENCOUNTER — Ambulatory Visit: Payer: Self-pay | Admitting: Internal Medicine

## 2023-12-03 VITALS — BP 136/86 | HR 62 | Temp 98.2°F | Resp 16 | Ht 72.0 in | Wt 198.6 lb

## 2023-12-03 DIAGNOSIS — I10 Essential (primary) hypertension: Secondary | ICD-10-CM

## 2023-12-03 DIAGNOSIS — Z23 Encounter for immunization: Secondary | ICD-10-CM | POA: Diagnosis not present

## 2023-12-03 LAB — CBC WITH DIFFERENTIAL/PLATELET
Basophils Absolute: 0.1 K/uL (ref 0.0–0.1)
Basophils Relative: 1.1 % (ref 0.0–3.0)
Eosinophils Absolute: 0.2 K/uL (ref 0.0–0.7)
Eosinophils Relative: 4.3 % (ref 0.0–5.0)
HCT: 41.5 % (ref 39.0–52.0)
Hemoglobin: 14 g/dL (ref 13.0–17.0)
Lymphocytes Relative: 22.6 % (ref 12.0–46.0)
Lymphs Abs: 1.3 K/uL (ref 0.7–4.0)
MCHC: 33.8 g/dL (ref 30.0–36.0)
MCV: 90.7 fl (ref 78.0–100.0)
Monocytes Absolute: 0.8 K/uL (ref 0.1–1.0)
Monocytes Relative: 13.3 % — ABNORMAL HIGH (ref 3.0–12.0)
Neutro Abs: 3.3 K/uL (ref 1.4–7.7)
Neutrophils Relative %: 58.7 % (ref 43.0–77.0)
Platelets: 182 K/uL (ref 150.0–400.0)
RBC: 4.58 Mil/uL (ref 4.22–5.81)
RDW: 13 % (ref 11.5–15.5)
WBC: 5.7 K/uL (ref 4.0–10.5)

## 2023-12-03 LAB — BASIC METABOLIC PANEL WITH GFR
BUN: 22 mg/dL (ref 6–23)
CO2: 26 meq/L (ref 19–32)
Calcium: 9 mg/dL (ref 8.4–10.5)
Chloride: 106 meq/L (ref 96–112)
Creatinine, Ser: 1.15 mg/dL (ref 0.40–1.50)
GFR: 67.7 mL/min (ref >60.00)
Glucose, Bld: 97 mg/dL (ref 70–99)
Potassium: 4.4 meq/L (ref 3.5–5.1)
Sodium: 138 meq/L (ref 135–145)

## 2023-12-03 MED ORDER — OLMESARTAN MEDOXOMIL 20 MG PO TABS
20.0000 mg | ORAL_TABLET | Freq: Every day | ORAL | 1 refills | Status: AC
  Filled 2024-05-17: qty 90, 90d supply, fill #0

## 2023-12-03 NOTE — Progress Notes (Signed)
 Subjective:  Patient ID: Cody Barnes, male    DOB: 1960/10/31  Age: 63 y.o. MRN: [id number]  CC: Hypertension   HPI Cody Barnes presents for f/up ----  Discussed the use of AI scribe software for clinical note transcription with the patient, who gave verbal consent to proceed.  History of Present Illness   Cody Barnes is a 63 year old male who presents for follow-up regarding previous shortness of breath.  He previously experienced shortness of breath, which has since resolved. An echocardiogram was performed, revealing no abnormalities, and he has not had any further episodes of shortness of breath.  No chest pain, dizziness, or lightheadedness. He also reports no symptoms of high blood pressure, such as dizziness or lightheadedness.       Outpatient Medications Prior to Visit  Medication Sig Dispense Refill   amoxicillin  (AMOXIL ) 500 MG capsule Take  prior to dentist appointment.     aspirin  EC 81 MG tablet Take 1 tablet (81 mg total) by mouth daily. Swallow whole.     atorvastatin  (LIPITOR) 40 MG tablet Take 1 tablet (40 mg total) by mouth daily. 90 tablet 3   fexofenadine (ALLEGRA) 180 MG tablet Take 180 mg by mouth daily as needed for allergies or rhinitis.     mometasone (ELOCON) 0.1 % cream Apply 1 Application topically as needed.     Multiple Vitamins-Minerals (ADULT GUMMY) CHEW Chew 2 capsules by mouth daily.     Omega-3 Fatty Acids (FISH OIL) 1000 MG CAPS      olmesartan  (BENICAR ) 20 MG tablet Take 1 tablet (20 mg total) by mouth daily. 90 tablet 1   No facility-administered medications prior to visit.    ROS Review of Systems  Constitutional:  Negative for appetite change, chills, diaphoresis, fatigue and fever.  HENT: Negative.    Eyes: Negative.   Respiratory: Negative.  Negative for cough, chest tightness, shortness of breath and wheezing.   Cardiovascular:  Negative for chest pain, palpitations and leg swelling.  Gastrointestinal: Negative.  Negative  for abdominal pain, diarrhea, nausea and vomiting.  Genitourinary: Negative.  Negative for difficulty urinating.  Musculoskeletal: Negative.  Negative for arthralgias, joint swelling and myalgias.  Skin:  Negative for color change.  Neurological:  Negative for dizziness and weakness.  Hematological:  Negative for adenopathy. Does not bruise/bleed easily.  Psychiatric/Behavioral: Negative.      Objective:  BP 136/86 (BP Location: Left Arm, Patient Position: Sitting, Cuff Size: Normal)   Pulse 62   Temp 98.2 F (36.8 C) (Oral)   Resp 16   Ht 6' (1.829 m)   Wt 198 lb 9.6 oz (90.1 kg)   SpO2 95%   BMI 26.94 kg/m   BP Readings from Last 3 Encounters:  12/03/23 136/86  07/25/23 109/64  07/09/23 118/80    Wt Readings from Last 3 Encounters:  12/03/23 198 lb 9.6 oz (90.1 kg)  07/25/23 200 lb (90.7 kg)  07/09/23 198 lb (89.8 kg)    Physical Exam Vitals reviewed.  Constitutional:      Appearance: Normal appearance.  HENT:     Mouth/Throat:     Mouth: Mucous membranes are moist.  Eyes:     General: No scleral icterus.    Conjunctiva/sclera: Conjunctivae normal.  Cardiovascular:     Rate and Rhythm: Normal rate and regular rhythm.     Heart sounds: Murmur heard.     Systolic murmur is present with a grade of 3/6.     No friction rub. No gallop.  Comments: Harsh SEM Pulmonary:     Effort: Pulmonary effort is normal.     Breath sounds: No stridor. No wheezing, rhonchi or rales.  Abdominal:     General: Abdomen is flat.     Palpations: There is no mass.     Tenderness: There is no abdominal tenderness. There is no guarding.     Hernia: No hernia is present.  Musculoskeletal:        General: Normal range of motion.     Cervical back: Neck supple.     Right lower leg: No edema.     Left lower leg: No edema.  Lymphadenopathy:     Cervical: No cervical adenopathy.  Skin:    General: Skin is warm and dry.  Neurological:     General: No focal deficit present.      Mental Status: He is alert. Mental status is at baseline.  Psychiatric:        Mood and Affect: Mood normal.        Behavior: Behavior normal.     Lab Results  Component Value Date   WBC 5.7 12/03/2023   HGB 14.0 12/03/2023   HCT 41.5 12/03/2023   PLT 182.0 12/03/2023   GLUCOSE 97 12/03/2023   CHOL 189 05/29/2023   TRIG 116.0 05/29/2023   HDL 50.00 05/29/2023   LDLCALC 116 (H) 05/29/2023   ALT 41 05/29/2023   AST 24 05/29/2023   NA 138 12/03/2023   K 4.4 12/03/2023   CL 106 12/03/2023   CREATININE 1.15 12/03/2023   BUN 22 12/03/2023   CO2 26 12/03/2023   TSH 1.79 05/29/2023   PSA 3.7 06/28/2023   INR 1.0 12/10/2013   HGBA1C 5.4 07/09/2023    CT CARDIAC SCORING ([facility] LOCATIONS ONLY) Result Date: 06/21/2023 CLINICAL DATA:  62 year old Caucasian male with history of shortness of breath on exertion. Family history of coronary artery disease. * Tracking Code: FCC * EXAM: CT CARDIAC CORONARY ARTERY CALCIUM  SCORE TECHNIQUE: Non-contrast imaging through the heart was performed using prospective ECG gating. Image post processing was performed on an independent workstation, allowing for quantitative analysis of the heart and coronary arteries. Note that this exam targets the heart and the chest was not imaged in its entirety. COMPARISON:  No priors. FINDINGS: CORONARY CALCIUM  SCORES: Left Main: 0 LAD: 16 LCx: 0 RCA/PDA: 0 Total Agatston Score: 16 MESA database percentile: 36th AORTA MEASUREMENTS: Ascending Aorta: 3.6 cm Descending Aorta:2.7 cm OTHER FINDINGS: Within the visualized portions of the thorax there are no suspicious appearing pulmonary nodules or masses, there is no acute consolidative airspace disease, no pleural effusions, no pneumothorax and no lymphadenopathy. Status post median sternotomy for aortic valve replacement (a stented bioprosthesis is noted) and mitral annuloplasty. Visualized portions of the upper abdomen demonstrates diffuse low attenuation throughout the visualized  hepatic parenchyma, indicative of a background of hepatic steatosis. There are no aggressive appearing lytic or blastic lesions noted in the visualized portions of the skeleton. IMPRESSION: 1. Patient's total coronary artery calcium  score is 16 which is 36th percentile for patient's of matched age, gender and race/ethnicity. Please note that although the presence of coronary artery calcium  documents the presence of coronary artery disease, the severity of this disease and any potential stenosis cannot be assessed on this noncontrast CT examination. Assessment for potential risk factor modification, dietary therapy or pharmacologic therapy may be warranted, if clinically indicated. 2. Hepatic steatosis. Electronically Signed   By: Toribio Aye M.D.   On: 06/21/2023 13:22  Assessment & Plan:   Essential hypertension- BP is adequately well controlled. Lytes and renal function are normal. -     Basic metabolic panel with GFR; Future -     CBC with Differential/Platelet; Future -     Olmesartan  Medoxomil; Take 1 tablet (20 mg total) by mouth daily.  Dispense: 90 tablet; Refill: 1  Immunization due -     Varicella-zoster vaccine IM -     Pneumococcal conjugate vaccine 20-valent     Follow-up: Return in about 6 months (around 06/04/2024).  Debby Molt, MD

## 2023-12-03 NOTE — Patient Instructions (Signed)
 Hypertension, Adult High blood pressure (hypertension) is when the force of blood pumping through the arteries is too strong. The arteries are the blood vessels that carry blood from the heart throughout the body. Hypertension forces the heart to work harder to pump blood and may cause arteries to become narrow or stiff. Untreated or uncontrolled hypertension can lead to a heart attack, heart failure, a stroke, kidney disease, and other problems. A blood pressure reading consists of a higher number over a lower number. Ideally, your blood pressure should be below 120/80. The first ("top") number is called the systolic pressure. It is a measure of the pressure in your arteries as your heart beats. The second ("bottom") number is called the diastolic pressure. It is a measure of the pressure in your arteries as the heart relaxes. What are the causes? The exact cause of this condition is not known. There are some conditions that result in high blood pressure. What increases the risk? Certain factors may make you more likely to develop high blood pressure. Some of these risk factors are under your control, including: Smoking. Not getting enough exercise or physical activity. Being overweight. Having too much fat, sugar, calories, or salt (sodium) in your diet. Drinking too much alcohol. Other risk factors include: Having a personal history of heart disease, diabetes, high cholesterol, or kidney disease. Stress. Having a family history of high blood pressure and high cholesterol. Having obstructive sleep apnea. Age. The risk increases with age. What are the signs or symptoms? High blood pressure may not cause symptoms. Very high blood pressure (hypertensive crisis) may cause: Headache. Fast or irregular heartbeats (palpitations). Shortness of breath. Nosebleed. Nausea and vomiting. Vision changes. Severe chest pain, dizziness, and seizures. How is this diagnosed? This condition is diagnosed by  measuring your blood pressure while you are seated, with your arm resting on a flat surface, your legs uncrossed, and your feet flat on the floor. The cuff of the blood pressure monitor will be placed directly against the skin of your upper arm at the level of your heart. Blood pressure should be measured at least twice using the same arm. Certain conditions can cause a difference in blood pressure between your right and left arms. If you have a high blood pressure reading during one visit or you have normal blood pressure with other risk factors, you may be asked to: Return on a different day to have your blood pressure checked again. Monitor your blood pressure at home for 1 week or longer. If you are diagnosed with hypertension, you may have other blood or imaging tests to help your health care provider understand your overall risk for other conditions. How is this treated? This condition is treated by making healthy lifestyle changes, such as eating healthy foods, exercising more, and reducing your alcohol intake. You may be referred for counseling on a healthy diet and physical activity. Your health care provider may prescribe medicine if lifestyle changes are not enough to get your blood pressure under control and if: Your systolic blood pressure is above 130. Your diastolic blood pressure is above 80. Your personal target blood pressure may vary depending on your medical conditions, your age, and other factors. Follow these instructions at home: Eating and drinking  Eat a diet that is high in fiber and potassium, and low in sodium, added sugar, and fat. An example of this eating plan is called the DASH diet. DASH stands for Dietary Approaches to Stop Hypertension. To eat this way: Eat  plenty of fresh fruits and vegetables. Try to fill one half of your plate at each meal with fruits and vegetables. Eat whole grains, such as whole-wheat pasta, brown rice, or whole-grain bread. Fill about one  fourth of your plate with whole grains. Eat or drink low-fat dairy products, such as skim milk or low-fat yogurt. Avoid fatty cuts of meat, processed or cured meats, and poultry with skin. Fill about one fourth of your plate with lean proteins, such as fish, chicken without skin, beans, eggs, or tofu. Avoid pre-made and processed foods. These tend to be higher in sodium, added sugar, and fat. Reduce your daily sodium intake. Many people with hypertension should eat less than 1,500 mg of sodium a day. Do not drink alcohol if: Your health care provider tells you not to drink. You are pregnant, may be pregnant, or are planning to become pregnant. If you drink alcohol: Limit how much you have to: 0-1 drink a day for women. 0-2 drinks a day for men. Know how much alcohol is in your drink. In the [place] one drink equals one 12 oz bottle of beer (355 mL), one 5 oz glass of wine (148 mL), or one 1 oz glass of hard liquor (44 mL). Lifestyle  Work with your health care provider to maintain a healthy body weight or to lose weight. Ask what an ideal weight is for you. Get at least 30 minutes of exercise that causes your heart to beat faster (aerobic exercise) most days of the week. Activities may include walking, swimming, or biking. Include exercise to strengthen your muscles (resistance exercise), such as Pilates or lifting weights, as part of your weekly exercise routine. Try to do these types of exercises for 30 minutes at least 3 days a week. Do not use any products that contain nicotine or tobacco. These products include cigarettes, chewing tobacco, and vaping devices, such as e-cigarettes. If you need help quitting, ask your health care provider. Monitor your blood pressure at home as told by your health care provider. Keep all follow-up visits. This is important. Medicines Take over-the-counter and prescription medicines only as told by your health care provider. Follow directions carefully. Blood  pressure medicines must be taken as prescribed. Do not skip doses of blood pressure medicine. Doing this puts you at risk for problems and can make the medicine less effective. Ask your health care provider about side effects or reactions to medicines that you should watch for. Contact a health care provider if you: Think you are having a reaction to a medicine you are taking. Have headaches that keep coming back (recurring). Feel dizzy. Have swelling in your ankles. Have trouble with your vision. Get help right away if you: Develop a severe headache or confusion. Have unusual weakness or numbness. Feel faint. Have severe pain in your chest or abdomen. Vomit repeatedly. Have trouble breathing. These symptoms may be an emergency. Get help right away. Call 911. Do not wait to see if the symptoms will go away. Do not drive yourself to the hospital. Summary Hypertension is when the force of blood pumping through your arteries is too strong. If this condition is not controlled, it may put you at risk for serious complications. Your personal target blood pressure may vary depending on your medical conditions, your age, and other factors. For most people, a normal blood pressure is less than 120/80. Hypertension is treated with lifestyle changes, medicines, or a combination of both. Lifestyle changes include losing weight, eating a healthy,  low-sodium diet, exercising more, and limiting alcohol. This information is not intended to replace advice given to you by your health care provider. Make sure you discuss any questions you have with your health care provider. Document Revised: 03/22/2021 Document Reviewed: 03/22/2021 Elsevier Patient Education  2024 [employer].

## 2023-12-18 ENCOUNTER — Other Ambulatory Visit (HOSPITAL_BASED_OUTPATIENT_CLINIC_OR_DEPARTMENT_OTHER): Payer: Self-pay

## 2024-01-30 ENCOUNTER — Other Ambulatory Visit (HOSPITAL_BASED_OUTPATIENT_CLINIC_OR_DEPARTMENT_OTHER): Payer: Self-pay

## 2024-01-31 ENCOUNTER — Other Ambulatory Visit (HOSPITAL_BASED_OUTPATIENT_CLINIC_OR_DEPARTMENT_OTHER): Payer: Self-pay

## 2024-02-22 ENCOUNTER — Telehealth: Payer: Self-pay

## 2024-02-22 NOTE — Telephone Encounter (Signed)
 Ordering provider: Dr. Wendel Associated diagnoses: Snoring (R06.83) WatchPAT PA obtained on 02/22/2024 by Dena JAYSON Hesselbach, CMA. Authorization: No prior authorization required per [employer]  Patient notified of PIN ([id number]) on 02/22/2024 via Notification Method: phone.  Phone note routed to covering staff for follow-up.

## 2024-03-03 ENCOUNTER — Telehealth: Payer: Self-pay | Admitting: Internal Medicine

## 2024-03-03 NOTE — Progress Notes (Unsigned)
 Cardiology Office Note:   Date:  03/04/2024  ID:  Cody Barnes, DOB 1961-04-21, MRN [id number] PCP:  Joshua Debby CROME, MD  [facility] Providers Cardiologist:  Wendel Haws, MD Referring MD: Joshua Debby CROME, MD  Chief Complaint/Reason for Referral: Follow-up for valvular heart disease ASSESSMENT:    1. Shortness of breath   2. Status post aortic valve replacement with tissue valve   3. H/O mitral valve repair   4. Diastolic dysfunction   5. Coronary artery calcification seen on CAT scan   6. Hyperlipidemia LDL goal <70   7. Primary hypertension   8. CKD (chronic kidney disease) stage 2, GFR 60-89 ml/min   9. BMI 26.0-26.9,adult   10. Snoring      PLAN:   In order of problems listed above: Shortness of breath: Has severe LVH on echocardiogram and BMI is elevated.  Reviewed echo with Dr. Santo.  The patient has mild to moderate patient prosthesis mismatch.  He is doing much better.  Monitor for now. Status post AVR: Stable mean gradient across his aortic valve.  Will review with imaging section regarding patient prosthesis mismatch History of mitral valve repair: Last echocardiogram demonstrated a durable repair with stable gradient Diastolic dysfunction: Continue olmesartan  20 mg Coronary artery calcification: Mild coronary artery calcification seen on calcium  score CT. Continue aspirin  81 mg and atorvastatin  40 mg. Hyperlipidemia: Increase atorvastatin  to 80 mg and check lipid panel, LFTs, and LP(a) in 2 months. Hypertension: Continue olmesartan  20 mg.  BP well-controlled today. CKD stage II: Continue olmesartan  20 mg Elevated BMI:  Diet and exercise modification. Snoring: Patient being evaluated for OSA            Dispo:  Return in about 1 year (around 03/04/2025).      Medication Adjustments/Labs and Tests Ordered: Current medicines are reviewed at length with the patient today.  Concerns regarding medicines are outlined above.  The following changes  have been made:     Labs/tests ordered: Orders Placed This Encounter  Procedures   Lipid panel   Hepatic function panel   Lipoprotein A (LPA)    Medication Changes: Meds ordered this encounter  Medications   atorvastatin  (LIPITOR) 80 MG tablet    Sig: Take 1 tablet (80 mg total) by mouth daily.    Dispense:  90 tablet    Refill:  3    Current medicines are reviewed at length with the patient today.  The patient does not have concerns regarding medicines.  I spent 33 minutes reviewing all clinical data during and prior to this visit including all relevant imaging studies, laboratories, clinical information from other health systems and prior notes from both Cardiology and other specialties, interviewing the patient, conducting a complete physical examination, and coordinating care in order to formulate a comprehensive and personalized evaluation and treatment plan.   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  He was seen a year ago.  An echocardiogram at that time demonstrated a mean gradient of 18 mmHg across his aortic valve replacement.  He had reported some shortness of breath.  A BNP was drawn and found not to be elevated.  The patient tells me that over the last 2 years she  has gained about 20 pounds.  He has noticed increasing shortness of breath when he has to walk up 2 flights of stairs for his job.  He denies any shortness of breath at rest.  He is also noticed some daytime somnolence and he does snore.  He denies any exertional angina, presyncope, syncope, palpitations, paroxysmal nocturnal dyspnea, orthopnea.  He has some mild dependent edema more so in his right lower extremity which improves with being supine.  He fortunately does not required any emergency room visits or hospitalizations.  He is tolerating his atorvastatin  without myalgias or arthralgias.  He has had no severe bleeding or bruising.  Plan: Obtain echocardiogram, increase atorvastatin  to 40 mg and check lipids, LFTs, LP(a) in 2 months, start aspirin  81 mg, evaluate for sleep apnea.  Check hemoglobin A1c.  October 2025:  Patient consents to use of AI scribe. In the interim his echocardiogram demonstrated preserved LV function with stable aortic and mitral gradients.  His LDL was above goal at 116.  His hemoglobin A1c was in the normal range at 5.4.  A sleep study was never completed.  His shortness of breath has resolved, which he attributes to potential springtime allergies despite taking allergy medication. He experiences congestion during allergy season.  He is physically active, walking 8,000 to 10,000 steps daily, aided by his two large [facility] [employer]. He also walks a mile to a mile and a half during lunch breaks at work.  No current shortness of breath.          Current Medications: Current Meds  Medication Sig   amoxicillin  (AMOXIL ) 500 MG capsule Take  prior to dentist appointment.   aspirin  EC 81 MG tablet Take 1 tablet (81 mg total) by mouth daily. Swallow whole.   atorvastatin  (LIPITOR) 80 MG tablet Take 1 tablet (80 mg total) by mouth daily.   fexofenadine (ALLEGRA) 180 MG tablet Take 180 mg by mouth daily as needed for allergies or rhinitis.   mometasone (ELOCON) 0.1 % cream  Apply 1 Application topically as needed.   Multiple Vitamins-Minerals (ADULT GUMMY) CHEW Chew 2 capsules by mouth daily.   olmesartan  (BENICAR ) 20 MG tablet Take 1 tablet (20 mg total) by mouth daily.   Omega-3 Fatty Acids (FISH OIL) 1000 MG CAPS    [DISCONTINUED] atorvastatin  (LIPITOR) 40 MG tablet Take 1 tablet (40 mg total) by mouth daily.     Review of Systems:   Please see the history of present illness.    All other systems reviewed and are negative.     EKGs/Labs/Other Test Reviewed:   EKG: 2025 normal sinus rhythm with IVCD  EKG Interpretation Date/Time:    Ventricular Rate:    PR Interval:    QRS Duration:    QT Interval:    QTC Calculation:   R Axis:      Text Interpretation:           Risk Assessment/Calculations:     STOP-Bang Score:  6       Physical Exam:   VS:  BP 108/82   Pulse (!) 59   Ht 6' (1.829 m)   Wt 194 lb 9.6 oz (88.3 kg)   SpO2 95%   BMI 26.39 kg/m        Wt Readings from Last 3 Encounters:  03/04/24 194 lb 9.6 oz (88.3 kg)  12/03/23 198 lb 9.6 oz (90.1 kg)  07/25/23 200 lb (90.7 kg)      GENERAL:  No apparent distress, AOx3 HEENT:  No carotid bruits, +2 carotid impulses, no scleral icterus CAR: RRR 3 out of 6 crescendo decrescendo murmur, gallops, rubs, or thrills RES:  Clear to auscultation bilaterally ABD:  Soft, nontender, nondistended, positive bowel sounds x 4 VASC:  +2 radial pulses, +2 carotid pulses NEURO:  CN 2-12 grossly intact; motor and sensory grossly intact PSYCH:  No active depression or anxiety EXT:  No edema, ecchymosis, or cyanosis  Signed, Lurena MARLA Red, MD  03/04/2024 11:09 AM    [facility] [address] [place], [place], [place]  [address] Phone: [phone]; Fax: [phone]   Note:  This document was prepared using Dragon voice recognition software and may include unintentional dictation errors.

## 2024-03-03 NOTE — Telephone Encounter (Signed)
**Note De-Identified Cody Barnes Obfuscation** The pt states that he was never given a WatchPAT One-HST device. He has an appointment scheduled with Dr Wendel at 10:40 tomorrow morning and he is aware to ask for [facility] concerning a sleep device while he is here.  He thanked me for returning his call.

## 2024-03-03 NOTE — Telephone Encounter (Signed)
 Pt returning from last week regarding Sleep study Please Advise

## 2024-03-04 ENCOUNTER — Other Ambulatory Visit (HOSPITAL_BASED_OUTPATIENT_CLINIC_OR_DEPARTMENT_OTHER): Payer: Self-pay

## 2024-03-04 ENCOUNTER — Ambulatory Visit: Attending: Internal Medicine | Admitting: Internal Medicine

## 2024-03-04 ENCOUNTER — Encounter (HOSPITAL_BASED_OUTPATIENT_CLINIC_OR_DEPARTMENT_OTHER): Payer: Self-pay | Admitting: Cardiology

## 2024-03-04 ENCOUNTER — Encounter: Payer: Self-pay | Admitting: Internal Medicine

## 2024-03-04 VITALS — BP 108/82 | HR 59 | Ht 72.0 in | Wt 194.6 lb

## 2024-03-04 DIAGNOSIS — N182 Chronic kidney disease, stage 2 (mild): Secondary | ICD-10-CM

## 2024-03-04 DIAGNOSIS — R0683 Snoring: Secondary | ICD-10-CM

## 2024-03-04 DIAGNOSIS — I2510 Atherosclerotic heart disease of native coronary artery without angina pectoris: Secondary | ICD-10-CM

## 2024-03-04 DIAGNOSIS — Z6826 Body mass index (BMI) 26.0-26.9, adult: Secondary | ICD-10-CM

## 2024-03-04 DIAGNOSIS — Z953 Presence of xenogenic heart valve: Secondary | ICD-10-CM

## 2024-03-04 DIAGNOSIS — E785 Hyperlipidemia, unspecified: Secondary | ICD-10-CM

## 2024-03-04 DIAGNOSIS — R0602 Shortness of breath: Secondary | ICD-10-CM

## 2024-03-04 DIAGNOSIS — Z9889 Other specified postprocedural states: Secondary | ICD-10-CM

## 2024-03-04 DIAGNOSIS — I5189 Other ill-defined heart diseases: Secondary | ICD-10-CM

## 2024-03-04 DIAGNOSIS — I10 Essential (primary) hypertension: Secondary | ICD-10-CM

## 2024-03-04 MED ORDER — ATORVASTATIN CALCIUM 80 MG PO TABS
80.0000 mg | ORAL_TABLET | Freq: Every day | ORAL | 3 refills | Status: AC
  Filled 2024-03-04: qty 30, 30d supply, fill #0
  Filled 2024-05-17: qty 30, 30d supply, fill #1
  Filled 2024-06-19: qty 30, 30d supply, fill #2

## 2024-03-04 NOTE — Telephone Encounter (Signed)
**Note De-Identified Veva Grimley Obfuscation** Patient agreement reviewed and signed on 03/04/2024.  WatchPAT issued to patient on 03/04/2024 by Nyia Tsao, Avelina HERO, LPN. Patient aware to not open the WatchPAT box until contacted with the activation PIN. Patient profile initialized in CloudPAT on 03/04/2024 by Macario Ladonya Jerkins, LPN. Device serial number: [id number]  Please list Reason for Call as Advice Only and type WatchPAT issued to patient in the comment box.

## 2024-03-04 NOTE — Patient Instructions (Signed)
 Medication Instructions:  Your physician has recommended you make the following change in your medication:  INCREASE: atorvastatin  (Lipitor) to 80 mg by mouth once daily  *If you need a refill on your cardiac medications before your next appointment, please call your pharmacy*  Lab Work: IN 2 MONTHS at any [facility]: fasting lipid panel, liver function test and lipoprotein A  If you have labs (blood work) drawn today and your tests are completely normal, you will receive your results only by: MyChart Message (if you have MyChart) OR A paper copy in the mail If you have any lab test that is abnormal or we need to change your treatment, we will call you to review the results.  Testing/Procedures: NONE  Follow-Up: At [facility], you and your health needs are our priority.  As part of our continuing mission to provide you with exceptional heart care, our providers are all part of one team.  This team includes your primary Cardiologist (physician) and Advanced Practice Providers or APPs (Physician Assistants and Nurse Practitioners) who all work together to provide you with the care you need, when you need it.  Your next appointment:   1 year(s)  Provider:   Arun K Thukkani, MD

## 2024-03-07 NOTE — Telephone Encounter (Signed)
 Spoke with Cody Barnes and he was able to complete his [place] sleep study on Tuesday 10/7.

## 2024-03-13 ENCOUNTER — Ambulatory Visit: Attending: Internal Medicine

## 2024-03-13 DIAGNOSIS — R0683 Snoring: Secondary | ICD-10-CM

## 2024-03-13 NOTE — Procedures (Signed)
    SLEEP STUDY REPORT Patient Information Study Date: 03/04/2024 Patient Name: Cody Barnes Patient ID: [id number] Birth Date: Jul 15, 1960 Age: 63 Gender:  BMI: 26.9 (W=198 lb, H=6' 0'') Referring Physician: Lurena Red, MD  TEST DESCRIPTION: Home sleep apnea testing was completed using the WatchPat, a Type 1 device, utilizing  peripheral arterial tonometry (PAT), chest movement, actigraphy, pulse oximetry, pulse rate, body position and snore.  AHI was calculated with apnea and hypopnea using valid sleep time as the denominator. RDI includes apneas,  hypopneas, and RERAs. The data acquired and the scoring of sleep and all associated events were performed in  accordance with the recommended standards and specifications as outlined in the AASM Manual for the Scoring of  Sleep and Associated Events 2.2.0 (2015).   FINDINGS: 1. No evidence of Obstructive Sleep Apnea with AHI 1.7/hr.  2. No Central Sleep Apnea. 3. Oxygen desaturations as low as 90%. 4. Mild to moderate snoring was present. O2 sats were < 88% for 0 minutes. 5. Total sleep time was 4 hrs and 50 min. 6. 16.3% of total sleep time was spent in REM sleep.  7. Normal sleep onset latency at 18 min.  8. Normal REM sleep onset latency at 95 min.  9. Total awakenings were 7.   DIAGNOSIS:  Normal study with no significant sleep disordered breathing.  RECOMMENDATIONS: 1. Normal study with no significant sleep disordered breathing. 2. Healthy sleep recommendations include: adequate nightly sleep (normal 7-9 hrs/night), avoidance of caffeine after  noon and alcohol near bedtime, and maintaining a sleep environment that is cool, dark and quiet. 3. Weight loss for overweight patients is recommended.  4. Snoring recommendations include: weight loss where appropriate, side sleeping, and avoidance of alcohol before  bed. 5. Operation of motor vehicle or dangerous equipment must be avoided when feeling drowsy, excessively sleepy,  or  mentally fatigued.  6. An ENT consultation which may be useful for specific causes of and possible treatment of bothersome snoring .  7. Weight loss may be of benefit in reducing the severity of snoring.   Signature: Wilbert Bihari, MD; [facility]; Diplomat, American Board of Sleep  Medicine Electronically Signed: 03/13/2024 2:03:14 PM

## 2024-03-14 ENCOUNTER — Telehealth: Payer: Self-pay | Admitting: *Deleted

## 2024-03-14 NOTE — Telephone Encounter (Signed)
-----   Message from Wilbert Bihari sent at 03/13/2024  2:04 PM EDT ----- Please let patient know that sleep study showed no significant sleep apnea.

## 2024-03-14 NOTE — Telephone Encounter (Signed)
 The patient has been notified of the result. Left detailed message on voicemail and informed patient to call back. Joshua Dalton Seip, CMA.  Pt is aware of normal results.

## 2024-04-04 ENCOUNTER — Ambulatory Visit: Payer: Self-pay | Admitting: Internal Medicine

## 2024-05-17 ENCOUNTER — Other Ambulatory Visit (HOSPITAL_BASED_OUTPATIENT_CLINIC_OR_DEPARTMENT_OTHER): Payer: Self-pay

## 2024-05-19 ENCOUNTER — Other Ambulatory Visit (HOSPITAL_BASED_OUTPATIENT_CLINIC_OR_DEPARTMENT_OTHER): Payer: Self-pay

## 2024-06-13 ENCOUNTER — Telehealth: Payer: Self-pay

## 2024-06-13 NOTE — Telephone Encounter (Signed)
 Copied from CRM [id number]. Topic: Clinical - Medical Advice >> Jun 13, 2024  9:52 AM Mia F wrote: Reason for CRM: Pt wife called stating whenever pt eats eggs it causes him to go to the bathroom within an hour. She wants to know if there is anything he can take or do to help with this. Please call her back at  [phone] or pt at [phone]

## 2024-06-18 NOTE — Telephone Encounter (Signed)
 Please advise.

## 2024-06-18 NOTE — Telephone Encounter (Signed)
 Does he want to be tested for egg allergy?

## 2024-06-18 NOTE — Telephone Encounter (Signed)
 Unable to reach patient. LMTRC

## 2024-06-19 ENCOUNTER — Other Ambulatory Visit (HOSPITAL_BASED_OUTPATIENT_CLINIC_OR_DEPARTMENT_OTHER): Payer: Self-pay

## 2024-06-19 ENCOUNTER — Telehealth: Payer: Self-pay

## 2024-06-19 NOTE — Telephone Encounter (Signed)
 Copied from CRM #[id number]. Topic: General - Other >> Jun 19, 2024 11:34 AM Cody Barnes wrote: Reason for CRM: Patient states that he does want to be tested for egg allergy. He states when he eats eggs, its not pleasant. He states he gets cramp and diarrhea after egg consumption. Patient is requesting a call back and can be reached at [phone].

## 2024-06-20 NOTE — Telephone Encounter (Signed)
 This is being handled in another telephone call.

## 2024-06-20 NOTE — Telephone Encounter (Signed)
 Unable to reach patient. LMTRC

## 2024-06-24 NOTE — Telephone Encounter (Signed)
 Patient states that he would actually like to be tested for the Egg allergy because it has started to irritate his stomach along with the other symptoms.

## 2024-06-25 ENCOUNTER — Other Ambulatory Visit: Payer: Self-pay | Admitting: Internal Medicine

## 2024-06-25 DIAGNOSIS — Z91012 Allergy to eggs, unspecified: Secondary | ICD-10-CM | POA: Insufficient documentation

## 2024-07-03 ENCOUNTER — Other Ambulatory Visit (HOSPITAL_BASED_OUTPATIENT_CLINIC_OR_DEPARTMENT_OTHER): Payer: Self-pay
